# Patient Record
Sex: Female | Born: 1935
Health system: Southern US, Community
[De-identification: ages and names within clinical notes are randomized; demographics above are authoritative.]

## PROBLEM LIST (undated history)

## (undated) DIAGNOSIS — I824Z2 Acute embolism and thrombosis of unspecified deep veins of left distal lower extremity: Secondary | ICD-10-CM

## (undated) DIAGNOSIS — F419 Anxiety disorder, unspecified: Secondary | ICD-10-CM

## (undated) DIAGNOSIS — M1990 Unspecified osteoarthritis, unspecified site: Secondary | ICD-10-CM

## (undated) DIAGNOSIS — R040 Epistaxis: Secondary | ICD-10-CM

## (undated) DIAGNOSIS — K6289 Other specified diseases of anus and rectum: Secondary | ICD-10-CM

## (undated) DIAGNOSIS — G629 Polyneuropathy, unspecified: Secondary | ICD-10-CM

## (undated) HISTORY — DX: Other specified diseases of anus and rectum: K62.89

## (undated) HISTORY — PX: ABDOMINAL HYSTERECTOMY: SHX81

## (undated) HISTORY — DX: Acute embolism and thrombosis of unspecified deep veins of left distal lower extremity: I82.4Z2

## (undated) HISTORY — PX: LASIK: SHX215

## (undated) HISTORY — DX: Anxiety disorder, unspecified: F41.9

## (undated) HISTORY — PX: CATARACT EXTRACTION, BILATERAL: SHX1313

---

## 2000-04-10 HISTORY — PX: TOTAL HIP ARTHROPLASTY: SHX124

## 2005-02-07 DIAGNOSIS — I824Z2 Acute embolism and thrombosis of unspecified deep veins of left distal lower extremity: Secondary | ICD-10-CM

## 2005-02-07 HISTORY — DX: Acute embolism and thrombosis of unspecified deep veins of left distal lower extremity: I82.4Z2

## 2009-01-06 DIAGNOSIS — G629 Polyneuropathy, unspecified: Secondary | ICD-10-CM

## 2009-01-06 HISTORY — DX: Polyneuropathy, unspecified: G62.9

## 2011-04-21 DIAGNOSIS — Z1231 Encounter for screening mammogram for malignant neoplasm of breast: Secondary | ICD-10-CM | POA: Diagnosis not present

## 2011-04-21 DIAGNOSIS — R922 Inconclusive mammogram: Secondary | ICD-10-CM | POA: Diagnosis not present

## 2011-06-21 DIAGNOSIS — H2510 Age-related nuclear cataract, unspecified eye: Secondary | ICD-10-CM | POA: Diagnosis not present

## 2011-07-03 DIAGNOSIS — M25579 Pain in unspecified ankle and joints of unspecified foot: Secondary | ICD-10-CM | POA: Diagnosis not present

## 2011-07-03 DIAGNOSIS — I739 Peripheral vascular disease, unspecified: Secondary | ICD-10-CM | POA: Diagnosis not present

## 2011-07-03 DIAGNOSIS — M2040 Other hammer toe(s) (acquired), unspecified foot: Secondary | ICD-10-CM | POA: Diagnosis not present

## 2011-08-11 DIAGNOSIS — D485 Neoplasm of uncertain behavior of skin: Secondary | ICD-10-CM | POA: Diagnosis not present

## 2011-08-11 DIAGNOSIS — L259 Unspecified contact dermatitis, unspecified cause: Secondary | ICD-10-CM | POA: Diagnosis not present

## 2011-08-11 DIAGNOSIS — L570 Actinic keratosis: Secondary | ICD-10-CM | POA: Diagnosis not present

## 2011-08-14 DIAGNOSIS — N760 Acute vaginitis: Secondary | ICD-10-CM | POA: Diagnosis not present

## 2011-09-13 DIAGNOSIS — IMO0002 Reserved for concepts with insufficient information to code with codable children: Secondary | ICD-10-CM | POA: Diagnosis not present

## 2011-09-13 DIAGNOSIS — E782 Mixed hyperlipidemia: Secondary | ICD-10-CM | POA: Diagnosis not present

## 2011-09-19 DIAGNOSIS — I739 Peripheral vascular disease, unspecified: Secondary | ICD-10-CM | POA: Diagnosis not present

## 2011-09-22 DIAGNOSIS — E782 Mixed hyperlipidemia: Secondary | ICD-10-CM | POA: Diagnosis not present

## 2011-09-22 DIAGNOSIS — IMO0002 Reserved for concepts with insufficient information to code with codable children: Secondary | ICD-10-CM | POA: Diagnosis not present

## 2011-09-26 DIAGNOSIS — M25579 Pain in unspecified ankle and joints of unspecified foot: Secondary | ICD-10-CM | POA: Diagnosis not present

## 2011-09-26 DIAGNOSIS — M79609 Pain in unspecified limb: Secondary | ICD-10-CM | POA: Diagnosis not present

## 2011-10-24 DIAGNOSIS — M25579 Pain in unspecified ankle and joints of unspecified foot: Secondary | ICD-10-CM | POA: Diagnosis not present

## 2011-10-24 DIAGNOSIS — M79609 Pain in unspecified limb: Secondary | ICD-10-CM | POA: Diagnosis not present

## 2011-10-24 DIAGNOSIS — M722 Plantar fascial fibromatosis: Secondary | ICD-10-CM | POA: Diagnosis not present

## 2011-11-22 DIAGNOSIS — H2510 Age-related nuclear cataract, unspecified eye: Secondary | ICD-10-CM | POA: Diagnosis not present

## 2011-11-28 DIAGNOSIS — I739 Peripheral vascular disease, unspecified: Secondary | ICD-10-CM | POA: Diagnosis not present

## 2012-01-11 DIAGNOSIS — Z23 Encounter for immunization: Secondary | ICD-10-CM | POA: Diagnosis not present

## 2012-02-06 DIAGNOSIS — I739 Peripheral vascular disease, unspecified: Secondary | ICD-10-CM | POA: Diagnosis not present

## 2012-02-14 DIAGNOSIS — D485 Neoplasm of uncertain behavior of skin: Secondary | ICD-10-CM | POA: Diagnosis not present

## 2012-02-14 DIAGNOSIS — M79609 Pain in unspecified limb: Secondary | ICD-10-CM | POA: Diagnosis not present

## 2012-02-14 DIAGNOSIS — L851 Acquired keratosis [keratoderma] palmaris et plantaris: Secondary | ICD-10-CM | POA: Diagnosis not present

## 2012-02-14 DIAGNOSIS — M25579 Pain in unspecified ankle and joints of unspecified foot: Secondary | ICD-10-CM | POA: Diagnosis not present

## 2012-02-14 DIAGNOSIS — L820 Inflamed seborrheic keratosis: Secondary | ICD-10-CM | POA: Diagnosis not present

## 2012-02-14 DIAGNOSIS — Z85828 Personal history of other malignant neoplasm of skin: Secondary | ICD-10-CM | POA: Diagnosis not present

## 2012-02-14 DIAGNOSIS — L259 Unspecified contact dermatitis, unspecified cause: Secondary | ICD-10-CM | POA: Diagnosis not present

## 2012-02-14 DIAGNOSIS — L570 Actinic keratosis: Secondary | ICD-10-CM | POA: Diagnosis not present

## 2012-03-26 DIAGNOSIS — D485 Neoplasm of uncertain behavior of skin: Secondary | ICD-10-CM | POA: Diagnosis not present

## 2012-03-26 DIAGNOSIS — L259 Unspecified contact dermatitis, unspecified cause: Secondary | ICD-10-CM | POA: Diagnosis not present

## 2012-03-26 DIAGNOSIS — L918 Other hypertrophic disorders of the skin: Secondary | ICD-10-CM | POA: Diagnosis not present

## 2012-04-23 DIAGNOSIS — I739 Peripheral vascular disease, unspecified: Secondary | ICD-10-CM | POA: Diagnosis not present

## 2012-05-27 DIAGNOSIS — H2510 Age-related nuclear cataract, unspecified eye: Secondary | ICD-10-CM | POA: Diagnosis not present

## 2012-05-27 DIAGNOSIS — H25019 Cortical age-related cataract, unspecified eye: Secondary | ICD-10-CM | POA: Diagnosis not present

## 2012-06-12 DIAGNOSIS — L2089 Other atopic dermatitis: Secondary | ICD-10-CM | POA: Diagnosis not present

## 2012-06-12 DIAGNOSIS — K5900 Constipation, unspecified: Secondary | ICD-10-CM | POA: Diagnosis not present

## 2012-07-09 DIAGNOSIS — I739 Peripheral vascular disease, unspecified: Secondary | ICD-10-CM | POA: Diagnosis not present

## 2012-08-07 DIAGNOSIS — F329 Major depressive disorder, single episode, unspecified: Secondary | ICD-10-CM | POA: Diagnosis not present

## 2012-08-21 DIAGNOSIS — Z85828 Personal history of other malignant neoplasm of skin: Secondary | ICD-10-CM | POA: Diagnosis not present

## 2012-08-21 DIAGNOSIS — L570 Actinic keratosis: Secondary | ICD-10-CM | POA: Diagnosis not present

## 2012-08-21 DIAGNOSIS — D485 Neoplasm of uncertain behavior of skin: Secondary | ICD-10-CM | POA: Diagnosis not present

## 2012-08-26 DIAGNOSIS — H43399 Other vitreous opacities, unspecified eye: Secondary | ICD-10-CM | POA: Diagnosis not present

## 2012-08-26 DIAGNOSIS — H43819 Vitreous degeneration, unspecified eye: Secondary | ICD-10-CM | POA: Diagnosis not present

## 2012-08-27 DIAGNOSIS — F329 Major depressive disorder, single episode, unspecified: Secondary | ICD-10-CM | POA: Diagnosis not present

## 2012-09-17 DIAGNOSIS — I739 Peripheral vascular disease, unspecified: Secondary | ICD-10-CM | POA: Diagnosis not present

## 2012-11-05 DIAGNOSIS — M25579 Pain in unspecified ankle and joints of unspecified foot: Secondary | ICD-10-CM | POA: Diagnosis not present

## 2012-11-05 DIAGNOSIS — M79609 Pain in unspecified limb: Secondary | ICD-10-CM | POA: Diagnosis not present

## 2012-12-03 DIAGNOSIS — I739 Peripheral vascular disease, unspecified: Secondary | ICD-10-CM | POA: Diagnosis not present

## 2012-12-18 DIAGNOSIS — Z23 Encounter for immunization: Secondary | ICD-10-CM | POA: Diagnosis not present

## 2013-01-08 DIAGNOSIS — E782 Mixed hyperlipidemia: Secondary | ICD-10-CM | POA: Diagnosis not present

## 2013-01-08 DIAGNOSIS — K5900 Constipation, unspecified: Secondary | ICD-10-CM | POA: Diagnosis not present

## 2013-01-08 DIAGNOSIS — L2089 Other atopic dermatitis: Secondary | ICD-10-CM | POA: Diagnosis not present

## 2013-01-08 DIAGNOSIS — F329 Major depressive disorder, single episode, unspecified: Secondary | ICD-10-CM | POA: Diagnosis not present

## 2013-01-08 DIAGNOSIS — IMO0002 Reserved for concepts with insufficient information to code with codable children: Secondary | ICD-10-CM | POA: Diagnosis not present

## 2013-01-15 DIAGNOSIS — H4011X0 Primary open-angle glaucoma, stage unspecified: Secondary | ICD-10-CM | POA: Diagnosis not present

## 2013-01-15 DIAGNOSIS — H353190 Nonexudative age-related macular degeneration, unspecified eye, stage unspecified: Secondary | ICD-10-CM | POA: Diagnosis not present

## 2013-01-15 DIAGNOSIS — H2510 Age-related nuclear cataract, unspecified eye: Secondary | ICD-10-CM | POA: Diagnosis not present

## 2013-01-15 DIAGNOSIS — H25019 Cortical age-related cataract, unspecified eye: Secondary | ICD-10-CM | POA: Diagnosis not present

## 2013-01-16 DIAGNOSIS — K5900 Constipation, unspecified: Secondary | ICD-10-CM | POA: Diagnosis not present

## 2013-01-16 DIAGNOSIS — IMO0002 Reserved for concepts with insufficient information to code with codable children: Secondary | ICD-10-CM | POA: Diagnosis not present

## 2013-01-16 DIAGNOSIS — Z1331 Encounter for screening for depression: Secondary | ICD-10-CM | POA: Diagnosis not present

## 2013-01-16 DIAGNOSIS — E782 Mixed hyperlipidemia: Secondary | ICD-10-CM | POA: Diagnosis not present

## 2013-01-16 DIAGNOSIS — L2089 Other atopic dermatitis: Secondary | ICD-10-CM | POA: Diagnosis not present

## 2013-01-23 DIAGNOSIS — Z1231 Encounter for screening mammogram for malignant neoplasm of breast: Secondary | ICD-10-CM | POA: Diagnosis not present

## 2013-02-03 DIAGNOSIS — H2510 Age-related nuclear cataract, unspecified eye: Secondary | ICD-10-CM | POA: Diagnosis not present

## 2013-02-06 DIAGNOSIS — E2839 Other primary ovarian failure: Secondary | ICD-10-CM | POA: Diagnosis not present

## 2013-02-06 DIAGNOSIS — Z90710 Acquired absence of both cervix and uterus: Secondary | ICD-10-CM | POA: Diagnosis not present

## 2013-02-06 DIAGNOSIS — M899 Disorder of bone, unspecified: Secondary | ICD-10-CM | POA: Diagnosis not present

## 2013-02-06 DIAGNOSIS — Z96649 Presence of unspecified artificial hip joint: Secondary | ICD-10-CM | POA: Diagnosis not present

## 2013-02-11 DIAGNOSIS — I739 Peripheral vascular disease, unspecified: Secondary | ICD-10-CM | POA: Diagnosis not present

## 2013-03-04 DIAGNOSIS — H2510 Age-related nuclear cataract, unspecified eye: Secondary | ICD-10-CM | POA: Diagnosis not present

## 2013-04-30 DIAGNOSIS — I739 Peripheral vascular disease, unspecified: Secondary | ICD-10-CM | POA: Diagnosis not present

## 2013-07-09 DIAGNOSIS — I739 Peripheral vascular disease, unspecified: Secondary | ICD-10-CM | POA: Diagnosis not present

## 2013-07-24 DIAGNOSIS — M79609 Pain in unspecified limb: Secondary | ICD-10-CM | POA: Diagnosis not present

## 2013-07-24 DIAGNOSIS — M1990 Unspecified osteoarthritis, unspecified site: Secondary | ICD-10-CM | POA: Diagnosis not present

## 2013-07-24 DIAGNOSIS — J019 Acute sinusitis, unspecified: Secondary | ICD-10-CM | POA: Diagnosis not present

## 2013-07-24 DIAGNOSIS — M542 Cervicalgia: Secondary | ICD-10-CM | POA: Diagnosis not present

## 2013-08-25 DIAGNOSIS — L570 Actinic keratosis: Secondary | ICD-10-CM | POA: Diagnosis not present

## 2013-08-25 DIAGNOSIS — Z85828 Personal history of other malignant neoplasm of skin: Secondary | ICD-10-CM | POA: Diagnosis not present

## 2013-09-09 DIAGNOSIS — E782 Mixed hyperlipidemia: Secondary | ICD-10-CM | POA: Diagnosis not present

## 2013-09-09 DIAGNOSIS — IMO0002 Reserved for concepts with insufficient information to code with codable children: Secondary | ICD-10-CM | POA: Diagnosis not present

## 2013-09-09 DIAGNOSIS — M1990 Unspecified osteoarthritis, unspecified site: Secondary | ICD-10-CM | POA: Diagnosis not present

## 2013-09-16 DIAGNOSIS — IMO0002 Reserved for concepts with insufficient information to code with codable children: Secondary | ICD-10-CM | POA: Diagnosis not present

## 2013-09-16 DIAGNOSIS — L2089 Other atopic dermatitis: Secondary | ICD-10-CM | POA: Diagnosis not present

## 2013-09-16 DIAGNOSIS — K5900 Constipation, unspecified: Secondary | ICD-10-CM | POA: Diagnosis not present

## 2013-09-16 DIAGNOSIS — E782 Mixed hyperlipidemia: Secondary | ICD-10-CM | POA: Diagnosis not present

## 2013-09-16 DIAGNOSIS — M542 Cervicalgia: Secondary | ICD-10-CM | POA: Diagnosis not present

## 2013-09-17 DIAGNOSIS — H353190 Nonexudative age-related macular degeneration, unspecified eye, stage unspecified: Secondary | ICD-10-CM | POA: Diagnosis not present

## 2013-09-17 DIAGNOSIS — Z961 Presence of intraocular lens: Secondary | ICD-10-CM | POA: Diagnosis not present

## 2013-09-17 DIAGNOSIS — H43819 Vitreous degeneration, unspecified eye: Secondary | ICD-10-CM | POA: Diagnosis not present

## 2013-09-17 DIAGNOSIS — H4011X0 Primary open-angle glaucoma, stage unspecified: Secondary | ICD-10-CM | POA: Diagnosis not present

## 2013-09-24 DIAGNOSIS — I739 Peripheral vascular disease, unspecified: Secondary | ICD-10-CM | POA: Diagnosis not present

## 2013-10-02 DIAGNOSIS — M542 Cervicalgia: Secondary | ICD-10-CM | POA: Diagnosis not present

## 2013-10-29 DIAGNOSIS — IMO0002 Reserved for concepts with insufficient information to code with codable children: Secondary | ICD-10-CM | POA: Diagnosis not present

## 2013-10-29 DIAGNOSIS — M9981 Other biomechanical lesions of cervical region: Secondary | ICD-10-CM | POA: Diagnosis not present

## 2013-10-30 DIAGNOSIS — IMO0002 Reserved for concepts with insufficient information to code with codable children: Secondary | ICD-10-CM | POA: Diagnosis not present

## 2013-10-30 DIAGNOSIS — M9981 Other biomechanical lesions of cervical region: Secondary | ICD-10-CM | POA: Diagnosis not present

## 2013-11-03 DIAGNOSIS — IMO0002 Reserved for concepts with insufficient information to code with codable children: Secondary | ICD-10-CM | POA: Diagnosis not present

## 2013-11-03 DIAGNOSIS — M9981 Other biomechanical lesions of cervical region: Secondary | ICD-10-CM | POA: Diagnosis not present

## 2013-11-06 DIAGNOSIS — IMO0002 Reserved for concepts with insufficient information to code with codable children: Secondary | ICD-10-CM | POA: Diagnosis not present

## 2013-11-06 DIAGNOSIS — M9981 Other biomechanical lesions of cervical region: Secondary | ICD-10-CM | POA: Diagnosis not present

## 2013-11-10 DIAGNOSIS — M9981 Other biomechanical lesions of cervical region: Secondary | ICD-10-CM | POA: Diagnosis not present

## 2013-11-10 DIAGNOSIS — IMO0002 Reserved for concepts with insufficient information to code with codable children: Secondary | ICD-10-CM | POA: Diagnosis not present

## 2013-11-12 DIAGNOSIS — IMO0002 Reserved for concepts with insufficient information to code with codable children: Secondary | ICD-10-CM | POA: Diagnosis not present

## 2013-11-12 DIAGNOSIS — M9981 Other biomechanical lesions of cervical region: Secondary | ICD-10-CM | POA: Diagnosis not present

## 2013-11-13 DIAGNOSIS — IMO0002 Reserved for concepts with insufficient information to code with codable children: Secondary | ICD-10-CM | POA: Diagnosis not present

## 2013-11-13 DIAGNOSIS — M9981 Other biomechanical lesions of cervical region: Secondary | ICD-10-CM | POA: Diagnosis not present

## 2013-11-17 DIAGNOSIS — M9981 Other biomechanical lesions of cervical region: Secondary | ICD-10-CM | POA: Diagnosis not present

## 2013-11-17 DIAGNOSIS — IMO0002 Reserved for concepts with insufficient information to code with codable children: Secondary | ICD-10-CM | POA: Diagnosis not present

## 2013-11-19 DIAGNOSIS — IMO0002 Reserved for concepts with insufficient information to code with codable children: Secondary | ICD-10-CM | POA: Diagnosis not present

## 2013-11-19 DIAGNOSIS — M9981 Other biomechanical lesions of cervical region: Secondary | ICD-10-CM | POA: Diagnosis not present

## 2013-11-20 DIAGNOSIS — IMO0002 Reserved for concepts with insufficient information to code with codable children: Secondary | ICD-10-CM | POA: Diagnosis not present

## 2013-11-20 DIAGNOSIS — M9981 Other biomechanical lesions of cervical region: Secondary | ICD-10-CM | POA: Diagnosis not present

## 2013-11-24 DIAGNOSIS — M9981 Other biomechanical lesions of cervical region: Secondary | ICD-10-CM | POA: Diagnosis not present

## 2013-11-24 DIAGNOSIS — IMO0002 Reserved for concepts with insufficient information to code with codable children: Secondary | ICD-10-CM | POA: Diagnosis not present

## 2013-12-09 DIAGNOSIS — I739 Peripheral vascular disease, unspecified: Secondary | ICD-10-CM | POA: Diagnosis not present

## 2014-01-06 DIAGNOSIS — L299 Pruritus, unspecified: Secondary | ICD-10-CM | POA: Diagnosis not present

## 2014-01-08 DIAGNOSIS — L299 Pruritus, unspecified: Secondary | ICD-10-CM | POA: Diagnosis not present

## 2014-01-15 DIAGNOSIS — Z23 Encounter for immunization: Secondary | ICD-10-CM | POA: Diagnosis not present

## 2014-02-17 DIAGNOSIS — D485 Neoplasm of uncertain behavior of skin: Secondary | ICD-10-CM | POA: Diagnosis not present

## 2014-02-17 DIAGNOSIS — L570 Actinic keratosis: Secondary | ICD-10-CM | POA: Diagnosis not present

## 2014-02-17 DIAGNOSIS — T148 Other injury of unspecified body region: Secondary | ICD-10-CM | POA: Diagnosis not present

## 2014-02-17 DIAGNOSIS — L989 Disorder of the skin and subcutaneous tissue, unspecified: Secondary | ICD-10-CM | POA: Diagnosis not present

## 2014-02-17 DIAGNOSIS — Z85828 Personal history of other malignant neoplasm of skin: Secondary | ICD-10-CM | POA: Diagnosis not present

## 2014-02-24 DIAGNOSIS — I739 Peripheral vascular disease, unspecified: Secondary | ICD-10-CM | POA: Diagnosis not present

## 2014-03-10 DIAGNOSIS — B86 Scabies: Secondary | ICD-10-CM | POA: Diagnosis not present

## 2014-03-11 DIAGNOSIS — M1990 Unspecified osteoarthritis, unspecified site: Secondary | ICD-10-CM | POA: Diagnosis not present

## 2014-03-11 DIAGNOSIS — L299 Pruritus, unspecified: Secondary | ICD-10-CM | POA: Diagnosis not present

## 2014-03-11 DIAGNOSIS — F328 Other depressive episodes: Secondary | ICD-10-CM | POA: Diagnosis not present

## 2014-03-11 DIAGNOSIS — E782 Mixed hyperlipidemia: Secondary | ICD-10-CM | POA: Diagnosis not present

## 2014-03-17 DIAGNOSIS — F329 Major depressive disorder, single episode, unspecified: Secondary | ICD-10-CM | POA: Diagnosis not present

## 2014-03-17 DIAGNOSIS — M542 Cervicalgia: Secondary | ICD-10-CM | POA: Diagnosis not present

## 2014-03-17 DIAGNOSIS — L299 Pruritus, unspecified: Secondary | ICD-10-CM | POA: Diagnosis not present

## 2014-03-17 DIAGNOSIS — Z1389 Encounter for screening for other disorder: Secondary | ICD-10-CM | POA: Diagnosis not present

## 2014-03-17 DIAGNOSIS — E782 Mixed hyperlipidemia: Secondary | ICD-10-CM | POA: Diagnosis not present

## 2014-03-24 DIAGNOSIS — Z85828 Personal history of other malignant neoplasm of skin: Secondary | ICD-10-CM | POA: Diagnosis not present

## 2014-03-24 DIAGNOSIS — L570 Actinic keratosis: Secondary | ICD-10-CM | POA: Diagnosis not present

## 2014-03-24 DIAGNOSIS — L97909 Non-pressure chronic ulcer of unspecified part of unspecified lower leg with unspecified severity: Secondary | ICD-10-CM | POA: Diagnosis not present

## 2014-04-15 DIAGNOSIS — R5381 Other malaise: Secondary | ICD-10-CM | POA: Diagnosis not present

## 2014-04-15 DIAGNOSIS — L509 Urticaria, unspecified: Secondary | ICD-10-CM | POA: Diagnosis not present

## 2014-04-15 DIAGNOSIS — D485 Neoplasm of uncertain behavior of skin: Secondary | ICD-10-CM | POA: Diagnosis not present

## 2014-04-15 DIAGNOSIS — Z85828 Personal history of other malignant neoplasm of skin: Secondary | ICD-10-CM | POA: Diagnosis not present

## 2014-04-15 DIAGNOSIS — L309 Dermatitis, unspecified: Secondary | ICD-10-CM | POA: Diagnosis not present

## 2014-04-21 DIAGNOSIS — L509 Urticaria, unspecified: Secondary | ICD-10-CM | POA: Diagnosis not present

## 2014-05-05 DIAGNOSIS — L509 Urticaria, unspecified: Secondary | ICD-10-CM | POA: Diagnosis not present

## 2014-05-19 DIAGNOSIS — I739 Peripheral vascular disease, unspecified: Secondary | ICD-10-CM | POA: Diagnosis not present

## 2014-05-20 DIAGNOSIS — L509 Urticaria, unspecified: Secondary | ICD-10-CM | POA: Diagnosis not present

## 2014-05-26 DIAGNOSIS — M79606 Pain in leg, unspecified: Secondary | ICD-10-CM | POA: Diagnosis not present

## 2014-05-26 DIAGNOSIS — F419 Anxiety disorder, unspecified: Secondary | ICD-10-CM | POA: Diagnosis not present

## 2014-05-26 DIAGNOSIS — F329 Major depressive disorder, single episode, unspecified: Secondary | ICD-10-CM | POA: Diagnosis not present

## 2014-05-28 DIAGNOSIS — F419 Anxiety disorder, unspecified: Secondary | ICD-10-CM | POA: Diagnosis not present

## 2014-05-28 DIAGNOSIS — F329 Major depressive disorder, single episode, unspecified: Secondary | ICD-10-CM | POA: Diagnosis not present

## 2014-05-29 DIAGNOSIS — R404 Transient alteration of awareness: Secondary | ICD-10-CM | POA: Diagnosis not present

## 2014-05-29 DIAGNOSIS — E538 Deficiency of other specified B group vitamins: Secondary | ICD-10-CM | POA: Diagnosis not present

## 2014-05-29 DIAGNOSIS — F419 Anxiety disorder, unspecified: Secondary | ICD-10-CM | POA: Diagnosis not present

## 2014-05-29 DIAGNOSIS — Z880 Allergy status to penicillin: Secondary | ICD-10-CM | POA: Diagnosis not present

## 2014-05-29 DIAGNOSIS — F329 Major depressive disorder, single episode, unspecified: Secondary | ICD-10-CM | POA: Diagnosis not present

## 2014-05-29 DIAGNOSIS — D649 Anemia, unspecified: Secondary | ICD-10-CM | POA: Diagnosis not present

## 2014-05-29 DIAGNOSIS — Z881 Allergy status to other antibiotic agents status: Secondary | ICD-10-CM | POA: Diagnosis not present

## 2014-05-29 DIAGNOSIS — Z79899 Other long term (current) drug therapy: Secondary | ICD-10-CM | POA: Diagnosis not present

## 2014-05-29 DIAGNOSIS — R197 Diarrhea, unspecified: Secondary | ICD-10-CM | POA: Diagnosis not present

## 2014-05-29 DIAGNOSIS — K5732 Diverticulitis of large intestine without perforation or abscess without bleeding: Secondary | ICD-10-CM | POA: Diagnosis not present

## 2014-05-29 DIAGNOSIS — R531 Weakness: Secondary | ICD-10-CM | POA: Diagnosis not present

## 2014-05-30 DIAGNOSIS — K5732 Diverticulitis of large intestine without perforation or abscess without bleeding: Secondary | ICD-10-CM | POA: Diagnosis not present

## 2014-05-30 DIAGNOSIS — R197 Diarrhea, unspecified: Secondary | ICD-10-CM | POA: Diagnosis not present

## 2014-05-31 DIAGNOSIS — K5732 Diverticulitis of large intestine without perforation or abscess without bleeding: Secondary | ICD-10-CM | POA: Diagnosis not present

## 2014-06-01 DIAGNOSIS — D519 Vitamin B12 deficiency anemia, unspecified: Secondary | ICD-10-CM | POA: Diagnosis not present

## 2014-06-01 DIAGNOSIS — I5031 Acute diastolic (congestive) heart failure: Secondary | ICD-10-CM | POA: Diagnosis not present

## 2014-06-03 DIAGNOSIS — D519 Vitamin B12 deficiency anemia, unspecified: Secondary | ICD-10-CM | POA: Diagnosis not present

## 2014-06-03 DIAGNOSIS — F329 Major depressive disorder, single episode, unspecified: Secondary | ICD-10-CM | POA: Diagnosis not present

## 2014-06-03 DIAGNOSIS — K5790 Diverticulosis of intestine, part unspecified, without perforation or abscess without bleeding: Secondary | ICD-10-CM | POA: Diagnosis not present

## 2014-06-08 DIAGNOSIS — R609 Edema, unspecified: Secondary | ICD-10-CM | POA: Diagnosis not present

## 2014-06-08 DIAGNOSIS — R0602 Shortness of breath: Secondary | ICD-10-CM | POA: Diagnosis not present

## 2014-06-08 DIAGNOSIS — I351 Nonrheumatic aortic (valve) insufficiency: Secondary | ICD-10-CM | POA: Diagnosis not present

## 2014-06-09 DIAGNOSIS — M7989 Other specified soft tissue disorders: Secondary | ICD-10-CM | POA: Diagnosis not present

## 2014-06-09 DIAGNOSIS — I8001 Phlebitis and thrombophlebitis of superficial vessels of right lower extremity: Secondary | ICD-10-CM | POA: Diagnosis not present

## 2014-06-09 DIAGNOSIS — D519 Vitamin B12 deficiency anemia, unspecified: Secondary | ICD-10-CM | POA: Diagnosis not present

## 2014-06-09 DIAGNOSIS — R609 Edema, unspecified: Secondary | ICD-10-CM | POA: Diagnosis not present

## 2014-06-16 DIAGNOSIS — L0390 Cellulitis, unspecified: Secondary | ICD-10-CM | POA: Diagnosis not present

## 2014-06-16 DIAGNOSIS — D519 Vitamin B12 deficiency anemia, unspecified: Secondary | ICD-10-CM | POA: Diagnosis not present

## 2014-06-23 DIAGNOSIS — D519 Vitamin B12 deficiency anemia, unspecified: Secondary | ICD-10-CM | POA: Diagnosis not present

## 2014-06-30 DIAGNOSIS — F329 Major depressive disorder, single episode, unspecified: Secondary | ICD-10-CM | POA: Diagnosis not present

## 2014-06-30 DIAGNOSIS — D519 Vitamin B12 deficiency anemia, unspecified: Secondary | ICD-10-CM | POA: Diagnosis not present

## 2014-06-30 DIAGNOSIS — K5790 Diverticulosis of intestine, part unspecified, without perforation or abscess without bleeding: Secondary | ICD-10-CM | POA: Diagnosis not present

## 2014-07-24 DIAGNOSIS — D519 Vitamin B12 deficiency anemia, unspecified: Secondary | ICD-10-CM | POA: Diagnosis not present

## 2014-08-04 DIAGNOSIS — I739 Peripheral vascular disease, unspecified: Secondary | ICD-10-CM | POA: Diagnosis not present

## 2014-08-21 DIAGNOSIS — D519 Vitamin B12 deficiency anemia, unspecified: Secondary | ICD-10-CM | POA: Diagnosis not present

## 2014-09-01 DIAGNOSIS — D519 Vitamin B12 deficiency anemia, unspecified: Secondary | ICD-10-CM | POA: Diagnosis not present

## 2014-09-01 DIAGNOSIS — K5790 Diverticulosis of intestine, part unspecified, without perforation or abscess without bleeding: Secondary | ICD-10-CM | POA: Diagnosis not present

## 2014-09-01 DIAGNOSIS — E782 Mixed hyperlipidemia: Secondary | ICD-10-CM | POA: Diagnosis not present

## 2014-09-01 DIAGNOSIS — M1990 Unspecified osteoarthritis, unspecified site: Secondary | ICD-10-CM | POA: Diagnosis not present

## 2014-09-01 DIAGNOSIS — I5031 Acute diastolic (congestive) heart failure: Secondary | ICD-10-CM | POA: Diagnosis not present

## 2014-09-11 DIAGNOSIS — F329 Major depressive disorder, single episode, unspecified: Secondary | ICD-10-CM | POA: Diagnosis not present

## 2014-09-11 DIAGNOSIS — I5031 Acute diastolic (congestive) heart failure: Secondary | ICD-10-CM | POA: Diagnosis not present

## 2014-09-11 DIAGNOSIS — R21 Rash and other nonspecific skin eruption: Secondary | ICD-10-CM | POA: Diagnosis not present

## 2014-09-11 DIAGNOSIS — E782 Mixed hyperlipidemia: Secondary | ICD-10-CM | POA: Diagnosis not present

## 2014-09-11 DIAGNOSIS — D519 Vitamin B12 deficiency anemia, unspecified: Secondary | ICD-10-CM | POA: Diagnosis not present

## 2014-09-11 DIAGNOSIS — F419 Anxiety disorder, unspecified: Secondary | ICD-10-CM | POA: Diagnosis not present

## 2014-09-16 DIAGNOSIS — M79672 Pain in left foot: Secondary | ICD-10-CM | POA: Diagnosis not present

## 2014-09-16 DIAGNOSIS — M25579 Pain in unspecified ankle and joints of unspecified foot: Secondary | ICD-10-CM | POA: Diagnosis not present

## 2014-09-22 DIAGNOSIS — D519 Vitamin B12 deficiency anemia, unspecified: Secondary | ICD-10-CM | POA: Diagnosis not present

## 2014-09-30 DIAGNOSIS — Z961 Presence of intraocular lens: Secondary | ICD-10-CM | POA: Diagnosis not present

## 2014-09-30 DIAGNOSIS — H3531 Nonexudative age-related macular degeneration: Secondary | ICD-10-CM | POA: Diagnosis not present

## 2014-09-30 DIAGNOSIS — H43812 Vitreous degeneration, left eye: Secondary | ICD-10-CM | POA: Diagnosis not present

## 2014-09-30 DIAGNOSIS — H4011X1 Primary open-angle glaucoma, mild stage: Secondary | ICD-10-CM | POA: Diagnosis not present

## 2014-10-20 DIAGNOSIS — I739 Peripheral vascular disease, unspecified: Secondary | ICD-10-CM | POA: Diagnosis not present

## 2014-10-22 DIAGNOSIS — D519 Vitamin B12 deficiency anemia, unspecified: Secondary | ICD-10-CM | POA: Diagnosis not present

## 2014-11-23 DIAGNOSIS — D519 Vitamin B12 deficiency anemia, unspecified: Secondary | ICD-10-CM | POA: Diagnosis not present

## 2014-11-25 DIAGNOSIS — R55 Syncope and collapse: Secondary | ICD-10-CM | POA: Diagnosis not present

## 2014-12-17 DIAGNOSIS — Z1283 Encounter for screening for malignant neoplasm of skin: Secondary | ICD-10-CM | POA: Diagnosis not present

## 2014-12-17 DIAGNOSIS — L570 Actinic keratosis: Secondary | ICD-10-CM | POA: Diagnosis not present

## 2014-12-17 DIAGNOSIS — L820 Inflamed seborrheic keratosis: Secondary | ICD-10-CM | POA: Diagnosis not present

## 2014-12-17 DIAGNOSIS — L503 Dermatographic urticaria: Secondary | ICD-10-CM | POA: Diagnosis not present

## 2014-12-17 DIAGNOSIS — X32XXXA Exposure to sunlight, initial encounter: Secondary | ICD-10-CM | POA: Diagnosis not present

## 2014-12-24 DIAGNOSIS — D519 Vitamin B12 deficiency anemia, unspecified: Secondary | ICD-10-CM | POA: Diagnosis not present

## 2014-12-26 DIAGNOSIS — Z23 Encounter for immunization: Secondary | ICD-10-CM | POA: Diagnosis not present

## 2014-12-29 DIAGNOSIS — I739 Peripheral vascular disease, unspecified: Secondary | ICD-10-CM | POA: Diagnosis not present

## 2015-01-25 DIAGNOSIS — D519 Vitamin B12 deficiency anemia, unspecified: Secondary | ICD-10-CM | POA: Diagnosis not present

## 2015-01-27 DIAGNOSIS — H6063 Unspecified chronic otitis externa, bilateral: Secondary | ICD-10-CM | POA: Diagnosis not present

## 2015-01-27 DIAGNOSIS — H903 Sensorineural hearing loss, bilateral: Secondary | ICD-10-CM | POA: Diagnosis not present

## 2015-01-27 DIAGNOSIS — H9312 Tinnitus, left ear: Secondary | ICD-10-CM | POA: Diagnosis not present

## 2015-02-16 DIAGNOSIS — H9312 Tinnitus, left ear: Secondary | ICD-10-CM | POA: Diagnosis not present

## 2015-02-26 DIAGNOSIS — D519 Vitamin B12 deficiency anemia, unspecified: Secondary | ICD-10-CM | POA: Diagnosis not present

## 2015-03-10 ENCOUNTER — Other Ambulatory Visit: Payer: Self-pay | Admitting: Otolaryngology

## 2015-03-10 DIAGNOSIS — M1990 Unspecified osteoarthritis, unspecified site: Secondary | ICD-10-CM | POA: Diagnosis not present

## 2015-03-10 DIAGNOSIS — F419 Anxiety disorder, unspecified: Secondary | ICD-10-CM | POA: Diagnosis not present

## 2015-03-10 DIAGNOSIS — D519 Vitamin B12 deficiency anemia, unspecified: Secondary | ICD-10-CM | POA: Diagnosis not present

## 2015-03-10 DIAGNOSIS — H9190 Unspecified hearing loss, unspecified ear: Secondary | ICD-10-CM

## 2015-03-10 DIAGNOSIS — K5790 Diverticulosis of intestine, part unspecified, without perforation or abscess without bleeding: Secondary | ICD-10-CM | POA: Diagnosis not present

## 2015-03-10 DIAGNOSIS — E782 Mixed hyperlipidemia: Secondary | ICD-10-CM | POA: Diagnosis not present

## 2015-03-10 DIAGNOSIS — F329 Major depressive disorder, single episode, unspecified: Secondary | ICD-10-CM | POA: Diagnosis not present

## 2015-03-16 DIAGNOSIS — Z86718 Personal history of other venous thrombosis and embolism: Secondary | ICD-10-CM | POA: Diagnosis not present

## 2015-03-16 DIAGNOSIS — E782 Mixed hyperlipidemia: Secondary | ICD-10-CM | POA: Diagnosis not present

## 2015-03-16 DIAGNOSIS — F419 Anxiety disorder, unspecified: Secondary | ICD-10-CM | POA: Diagnosis not present

## 2015-03-16 DIAGNOSIS — D519 Vitamin B12 deficiency anemia, unspecified: Secondary | ICD-10-CM | POA: Diagnosis not present

## 2015-03-16 DIAGNOSIS — I5031 Acute diastolic (congestive) heart failure: Secondary | ICD-10-CM | POA: Diagnosis not present

## 2015-03-16 DIAGNOSIS — R21 Rash and other nonspecific skin eruption: Secondary | ICD-10-CM | POA: Diagnosis not present

## 2015-03-16 DIAGNOSIS — F329 Major depressive disorder, single episode, unspecified: Secondary | ICD-10-CM | POA: Diagnosis not present

## 2015-03-22 DIAGNOSIS — I739 Peripheral vascular disease, unspecified: Secondary | ICD-10-CM | POA: Diagnosis not present

## 2015-03-26 ENCOUNTER — Ambulatory Visit
Admission: RE | Admit: 2015-03-26 | Discharge: 2015-03-26 | Disposition: A | Discharge status: Home / Self Care | Payer: Medicare Other | Source: Ambulatory Visit | Attending: Otolaryngology | Admitting: Otolaryngology

## 2015-03-26 DIAGNOSIS — H9312 Tinnitus, left ear: Secondary | ICD-10-CM | POA: Diagnosis not present

## 2015-03-26 DIAGNOSIS — H9190 Unspecified hearing loss, unspecified ear: Secondary | ICD-10-CM

## 2015-03-26 DIAGNOSIS — I639 Cerebral infarction, unspecified: Secondary | ICD-10-CM | POA: Diagnosis not present

## 2015-03-26 MED ORDER — GADOBENATE DIMEGLUMINE 529 MG/ML IV SOLN
14.0000 mL | Freq: Once | INTRAVENOUS | Status: AC | PRN
  Administered 2015-03-26: 14 mL via INTRAVENOUS

## 2015-03-29 DIAGNOSIS — D519 Vitamin B12 deficiency anemia, unspecified: Secondary | ICD-10-CM | POA: Diagnosis not present

## 2015-03-30 DIAGNOSIS — M25579 Pain in unspecified ankle and joints of unspecified foot: Secondary | ICD-10-CM | POA: Diagnosis not present

## 2015-03-30 DIAGNOSIS — M79672 Pain in left foot: Secondary | ICD-10-CM | POA: Diagnosis not present

## 2015-04-21 DIAGNOSIS — H353132 Nonexudative age-related macular degeneration, bilateral, intermediate dry stage: Secondary | ICD-10-CM | POA: Diagnosis not present

## 2015-04-21 DIAGNOSIS — H401131 Primary open-angle glaucoma, bilateral, mild stage: Secondary | ICD-10-CM | POA: Diagnosis not present

## 2015-04-29 DIAGNOSIS — D519 Vitamin B12 deficiency anemia, unspecified: Secondary | ICD-10-CM | POA: Diagnosis not present

## 2015-05-24 DIAGNOSIS — J209 Acute bronchitis, unspecified: Secondary | ICD-10-CM | POA: Diagnosis not present

## 2015-05-24 DIAGNOSIS — J019 Acute sinusitis, unspecified: Secondary | ICD-10-CM | POA: Diagnosis not present

## 2015-05-31 DIAGNOSIS — D519 Vitamin B12 deficiency anemia, unspecified: Secondary | ICD-10-CM | POA: Diagnosis not present

## 2015-06-14 DIAGNOSIS — I739 Peripheral vascular disease, unspecified: Secondary | ICD-10-CM | POA: Diagnosis not present

## 2015-06-21 DIAGNOSIS — M25579 Pain in unspecified ankle and joints of unspecified foot: Secondary | ICD-10-CM | POA: Diagnosis not present

## 2015-06-21 DIAGNOSIS — M79672 Pain in left foot: Secondary | ICD-10-CM | POA: Diagnosis not present

## 2015-06-22 DIAGNOSIS — S50312A Abrasion of left elbow, initial encounter: Secondary | ICD-10-CM | POA: Diagnosis not present

## 2015-06-22 DIAGNOSIS — R51 Headache: Secondary | ICD-10-CM | POA: Diagnosis not present

## 2015-06-22 DIAGNOSIS — S199XXA Unspecified injury of neck, initial encounter: Secondary | ICD-10-CM | POA: Diagnosis not present

## 2015-06-22 DIAGNOSIS — G4489 Other headache syndrome: Secondary | ICD-10-CM | POA: Diagnosis not present

## 2015-06-22 DIAGNOSIS — Z96642 Presence of left artificial hip joint: Secondary | ICD-10-CM | POA: Diagnosis not present

## 2015-06-22 DIAGNOSIS — S60221A Contusion of right hand, initial encounter: Secondary | ICD-10-CM | POA: Diagnosis not present

## 2015-06-22 DIAGNOSIS — S59912A Unspecified injury of left forearm, initial encounter: Secondary | ICD-10-CM | POA: Diagnosis not present

## 2015-06-22 DIAGNOSIS — T887XXA Unspecified adverse effect of drug or medicament, initial encounter: Secondary | ICD-10-CM | POA: Diagnosis not present

## 2015-06-22 DIAGNOSIS — Z79899 Other long term (current) drug therapy: Secondary | ICD-10-CM | POA: Diagnosis not present

## 2015-06-22 DIAGNOSIS — S0081XA Abrasion of other part of head, initial encounter: Secondary | ICD-10-CM | POA: Diagnosis not present

## 2015-06-22 DIAGNOSIS — S40022A Contusion of left upper arm, initial encounter: Secondary | ICD-10-CM | POA: Diagnosis not present

## 2015-06-22 DIAGNOSIS — S0990XA Unspecified injury of head, initial encounter: Secondary | ICD-10-CM | POA: Diagnosis not present

## 2015-06-22 DIAGNOSIS — R220 Localized swelling, mass and lump, head: Secondary | ICD-10-CM | POA: Diagnosis not present

## 2015-06-22 DIAGNOSIS — S098XXA Other specified injuries of head, initial encounter: Secondary | ICD-10-CM | POA: Diagnosis not present

## 2015-06-22 DIAGNOSIS — S80211A Abrasion, right knee, initial encounter: Secondary | ICD-10-CM | POA: Diagnosis not present

## 2015-06-22 DIAGNOSIS — S40021A Contusion of right upper arm, initial encounter: Secondary | ICD-10-CM | POA: Diagnosis not present

## 2015-06-22 DIAGNOSIS — Z7901 Long term (current) use of anticoagulants: Secondary | ICD-10-CM | POA: Diagnosis not present

## 2015-06-22 DIAGNOSIS — S80212A Abrasion, left knee, initial encounter: Secondary | ICD-10-CM | POA: Diagnosis not present

## 2015-06-22 DIAGNOSIS — T45515A Adverse effect of anticoagulants, initial encounter: Secondary | ICD-10-CM | POA: Diagnosis not present

## 2015-06-22 DIAGNOSIS — H5712 Ocular pain, left eye: Secondary | ICD-10-CM | POA: Diagnosis not present

## 2015-06-23 DIAGNOSIS — Z23 Encounter for immunization: Secondary | ICD-10-CM | POA: Diagnosis not present

## 2015-06-23 DIAGNOSIS — S51002A Unspecified open wound of left elbow, initial encounter: Secondary | ICD-10-CM | POA: Diagnosis not present

## 2015-06-23 DIAGNOSIS — S0990XA Unspecified injury of head, initial encounter: Secondary | ICD-10-CM | POA: Diagnosis not present

## 2015-06-28 DIAGNOSIS — D519 Vitamin B12 deficiency anemia, unspecified: Secondary | ICD-10-CM | POA: Diagnosis not present

## 2015-07-05 DIAGNOSIS — M25552 Pain in left hip: Secondary | ICD-10-CM | POA: Diagnosis not present

## 2015-07-19 DIAGNOSIS — S0083XD Contusion of other part of head, subsequent encounter: Secondary | ICD-10-CM | POA: Diagnosis not present

## 2015-07-19 DIAGNOSIS — M25552 Pain in left hip: Secondary | ICD-10-CM | POA: Diagnosis not present

## 2015-07-30 DIAGNOSIS — D519 Vitamin B12 deficiency anemia, unspecified: Secondary | ICD-10-CM | POA: Diagnosis not present

## 2015-08-01 DIAGNOSIS — K5730 Diverticulosis of large intestine without perforation or abscess without bleeding: Secondary | ICD-10-CM | POA: Diagnosis not present

## 2015-08-01 DIAGNOSIS — Z880 Allergy status to penicillin: Secondary | ICD-10-CM | POA: Diagnosis not present

## 2015-08-01 DIAGNOSIS — I82402 Acute embolism and thrombosis of unspecified deep veins of left lower extremity: Secondary | ICD-10-CM | POA: Diagnosis not present

## 2015-08-01 DIAGNOSIS — Z96642 Presence of left artificial hip joint: Secondary | ICD-10-CM | POA: Diagnosis present

## 2015-08-01 DIAGNOSIS — I82412 Acute embolism and thrombosis of left femoral vein: Secondary | ICD-10-CM | POA: Diagnosis not present

## 2015-08-01 DIAGNOSIS — I82432 Acute embolism and thrombosis of left popliteal vein: Secondary | ICD-10-CM | POA: Diagnosis not present

## 2015-08-01 DIAGNOSIS — I82442 Acute embolism and thrombosis of left tibial vein: Secondary | ICD-10-CM | POA: Diagnosis not present

## 2015-08-01 DIAGNOSIS — Z7901 Long term (current) use of anticoagulants: Secondary | ICD-10-CM | POA: Diagnosis not present

## 2015-08-01 DIAGNOSIS — D6851 Activated protein C resistance: Secondary | ICD-10-CM | POA: Diagnosis not present

## 2015-08-01 DIAGNOSIS — F411 Generalized anxiety disorder: Secondary | ICD-10-CM | POA: Diagnosis not present

## 2015-08-01 DIAGNOSIS — I82422 Acute embolism and thrombosis of left iliac vein: Secondary | ICD-10-CM | POA: Diagnosis present

## 2015-08-01 DIAGNOSIS — E785 Hyperlipidemia, unspecified: Secondary | ICD-10-CM | POA: Diagnosis present

## 2015-08-01 DIAGNOSIS — F329 Major depressive disorder, single episode, unspecified: Secondary | ICD-10-CM | POA: Diagnosis present

## 2015-08-01 DIAGNOSIS — E7800 Pure hypercholesterolemia, unspecified: Secondary | ICD-10-CM | POA: Diagnosis present

## 2015-08-01 DIAGNOSIS — F410 Panic disorder [episodic paroxysmal anxiety] without agoraphobia: Secondary | ICD-10-CM | POA: Diagnosis present

## 2015-08-01 DIAGNOSIS — Z881 Allergy status to other antibiotic agents status: Secondary | ICD-10-CM | POA: Diagnosis not present

## 2015-08-01 DIAGNOSIS — Z79899 Other long term (current) drug therapy: Secondary | ICD-10-CM | POA: Diagnosis not present

## 2015-08-01 DIAGNOSIS — M7989 Other specified soft tissue disorders: Secondary | ICD-10-CM | POA: Diagnosis not present

## 2015-08-01 DIAGNOSIS — Z832 Family history of diseases of the blood and blood-forming organs and certain disorders involving the immune mechanism: Secondary | ICD-10-CM | POA: Diagnosis not present

## 2015-08-09 DIAGNOSIS — L509 Urticaria, unspecified: Secondary | ICD-10-CM | POA: Diagnosis not present

## 2015-08-23 DIAGNOSIS — I739 Peripheral vascular disease, unspecified: Secondary | ICD-10-CM | POA: Diagnosis not present

## 2015-08-31 ENCOUNTER — Ambulatory Visit: Payer: Self-pay | Admitting: Allergy and Immunology

## 2015-09-02 DIAGNOSIS — D519 Vitamin B12 deficiency anemia, unspecified: Secondary | ICD-10-CM | POA: Diagnosis not present

## 2015-09-02 DIAGNOSIS — I82402 Acute embolism and thrombosis of unspecified deep veins of left lower extremity: Secondary | ICD-10-CM | POA: Diagnosis not present

## 2015-09-02 DIAGNOSIS — R21 Rash and other nonspecific skin eruption: Secondary | ICD-10-CM | POA: Diagnosis not present

## 2015-09-02 DIAGNOSIS — L299 Pruritus, unspecified: Secondary | ICD-10-CM | POA: Diagnosis not present

## 2015-09-21 ENCOUNTER — Ambulatory Visit: Payer: Self-pay | Admitting: Allergy and Immunology

## 2015-10-04 DIAGNOSIS — D519 Vitamin B12 deficiency anemia, unspecified: Secondary | ICD-10-CM | POA: Diagnosis not present

## 2015-10-19 DIAGNOSIS — Z961 Presence of intraocular lens: Secondary | ICD-10-CM | POA: Diagnosis not present

## 2015-10-19 DIAGNOSIS — H43812 Vitreous degeneration, left eye: Secondary | ICD-10-CM | POA: Diagnosis not present

## 2015-10-19 DIAGNOSIS — H524 Presbyopia: Secondary | ICD-10-CM | POA: Diagnosis not present

## 2015-10-19 DIAGNOSIS — H52223 Regular astigmatism, bilateral: Secondary | ICD-10-CM | POA: Diagnosis not present

## 2015-10-19 DIAGNOSIS — H401131 Primary open-angle glaucoma, bilateral, mild stage: Secondary | ICD-10-CM | POA: Diagnosis not present

## 2015-10-19 DIAGNOSIS — H5212 Myopia, left eye: Secondary | ICD-10-CM | POA: Diagnosis not present

## 2015-10-26 ENCOUNTER — Ambulatory Visit (INDEPENDENT_AMBULATORY_CARE_PROVIDER_SITE_OTHER): Payer: Medicare Other | Admitting: Allergy and Immunology

## 2015-10-26 ENCOUNTER — Encounter: Payer: Self-pay | Admitting: Allergy and Immunology

## 2015-10-26 VITALS — BP 115/70 | HR 81 | Temp 97.6°F | Resp 17 | Ht 62.21 in | Wt 150.8 lb

## 2015-10-26 DIAGNOSIS — L299 Pruritus, unspecified: Secondary | ICD-10-CM

## 2015-10-26 NOTE — Patient Instructions (Signed)
Take Home Sheet  1. Avoidance: Mite and Mold  And of fragranced products.   2. Antihistamine:   Zyrtec 1-2 teaspoons by mouth once daily for runny nose or itching as needed.  3.  Moisturize skin 2-3 times daily ---Consider Vanicream or Cerave.   4.  May use Over the counter cortisone cream once daily as needed to rash areas.  5.  If new rash areas or skin changes take picture---environment, ingestion, activity and exposure.   6. Follow up Visit:  2 months or sooner if needed.   Websites that have reliable Patient information: 1. American Academy of Asthma, Allergy, & Immunology: www.aaaai.org 2. Food Allergy Network: www.foodallergy.org 3. Mothers of Asthmatics: www.aanma.org 4. [place]: DiningCalendar.de 5. American College of Allergy, Asthma, & Immunology: https://robertson.info/ or www.acaai.org  Control of House Dust Mite Allergen  House dust mites play a major role in allergic asthma and rhinitis.  They occur in environments with high humidity wherever human skin, the food for dust mites is found. High levels have been detected in dust obtained from mattresses, pillows, carpets, upholstered furniture, bed covers, clothes and soft toys.  The principal allergen of the house dust mite is found in its feces.  A gram of dust may contain 1,000 mites and 250,000 fecal particles.  Mite antigen is easily measured in the air during house cleaning activities.  1. Encase mattresses, including the box spring, and pillow, in an air tight cover.  Seal the zipper end of the encased mattresses with wide adhesive tape. 2. Wash the bedding in water of 130 degrees Farenheit weekly.  Avoid cotton comforters/quilts and flannel bedding: the most ideal bed covering is the dacron comforter. 3. Remove all upholstered furniture from the bedroom. 4. Remove carpets, carpet padding, rugs, and non-washable window drapes from the bedroom.  Wash drapes weekly or use plastic window  coverings. 5. Remove all non-washable stuffed toys from the bedroom.  Wash stuffed toys weekly. 6. Have the room cleaned frequently with a vacuum cleaner and a damp dust-mop.  The patient should not be in a room which is being cleaned and should wait 1 hour after cleaning before going into the room. 7. Close and seal all heating outlets in the bedroom.  Otherwise, the room will become filled with dust-laden air.  An electric heater can be used to heat the room. Reduce indoor humidity to less than 50%.  Do not use a humidifier.  Reducing Pollen Exposure  The American Academy of Allergy, Asthma and Immunology suggests the following steps to reduce your exposure to pollen during allergy seasons.  8. Do not hang sheets or clothing out to dry; pollen may collect on these items. 9. Do not mow lawns or spend time around freshly cut grass; mowing stirs up pollen. 10. Keep windows closed at night.  Keep car windows closed while driving. 11. Minimize morning activities outdoors, a time when pollen counts are usually at their highest. 12. Stay indoors as much as possible when pollen counts or humidity is high and on windy days when pollen tends to remain in the air longer. 13. Use air conditioning when possible.  Many air conditioners have filters that trap the pollen spores. 14. Use a HEPA room air filter to remove pollen form the indoor air you breathe.  Control of Mold Allergen  Mold and fungi can grow on a variety of surfaces provided certain temperature and moisture conditions exist.  Outdoor molds grow on plants, decaying vegetation and soil.  The major outdoor mold, Alternaria dn Cladosporium, are found in very high numbers during hot and dry conditions.  Generally, a late Summer - Fall peak is seen for common outdoor fungal spores.  Rain will temporarily lower outdoor mold spore count, but counts rise rapidly when the rainy period ends.  The most important indoor molds are Aspergillus and  Penicillium.  Dark, humid and poorly ventilated basements are ideal sites for mold growth.  The next most common sites of mold growth are the bathroom and the kitchen.  Outdoor [employer] 1. Use air conditioning and keep windows closed 2. Avoid exposure to decaying vegetation. 3. Avoid leaf raking. 4. Avoid grain handling. 5. Consider wearing a face mask if working in moldy areas.  Indoor Mold Control 1. Maintain humidity below 50%. 2. Clean washable surfaces with 5% bleach solution. 3. Remove sources e.g. Contaminated carpets.  Control of Cockroach Allergen  Cockroach allergen has been identified as an important cause of acute attacks of asthma, especially in urban settings.  There are fifty-five species of cockroach that exist in the [place], however only three, the [place], [place] species produce allergen that can affect patients with Asthma.  Allergens can be obtained from fecal particles, egg casings and secretions from cockroaches.  1. Remove food sources. 2. Reduce access to water. 3. Seal access and entry points. 4. Spray runways with 0.5-1% Diazinon or Chlorpyrifos 5. Blow boric acid power under stoves and refrigerator. 6. Place bait stations (hydramethylnon) at feeding sites.

## 2015-10-26 NOTE — Progress Notes (Signed)
NEW PATIENT NOTE  RE: Alexandria Weaver MRN: [id number] DOB: 1924-12-22 [facility] [address] [place], [place] [address] Date of Office Visit: 10/26/2015  Dear Tawni Carnes, PA-C:  I had the pleasure of seeing Alexandria Weaver today in initial evaluation, as you recall-- Subjective:  Alexandria Weaver is a 82 y.o. female who presents today for Pruritus("Usually at pressure points"  (joints))  Assessment:   1. Itching, improved with noted xerosis.   2.      Clear skin today, family preference to defer aeroallergen/food testing. 3.      Question of previous rash now improved. Plan:  1. Avoidance: Mite and Mold  And of fragranced products. 2. Antihistamine:   Zyrtec 1-2 teaspoons by mouth once daily for runny nose or itching as needed. 3.  Moisturize skin 2-3 times daily ---Consider Vanicream or Cerave. 4.  May use Over the counter cortisone cream once daily as needed to rash areas only 5.  If new rash areas or skin changes take picture---document environment, ingestion, activity and exposure. 6. Follow up Visit:  2 months or sooner if needed.  HPI: Alexandria Weaver presents to the office with her daughter in initial evaluation of itching.  They describe a 2 year history of skin concerns with evaluation by primary MD and 2 different dermatologists.  Her greatest areas of sensitivity have included posterior neck area across back, prompting tag removal, at trunk and intermittently "pressure points--joints".  There does not appear to be any acute reactions, swelling, current hives/rash or specific exposure, ingestion or activity as a provoking factor.  She denies any recurring allergic symptoms and has been using Aveeno in the last year which seems beneficial.  She did give a trial of discontinuing Tide and using Dreft as well as Aveeno in the shower.  Currently she often uses [employer] products and intermittently over-the-counter cortisone cream to pruritic areas.  It appears  she has been treated with Elimite, questionable benefit as well as discontinuation of her regular oral medications by Dr. Tarri Glenn.  He also modified her topical regime and at some time used triamcinolone cream and dapsone but family cannot remember if this was beneficial or when this occurred, including time with [facility], [employer].  Recently, she is better, minimal itching but continues to avoid rough clothing.  There is a question of any previous hive difficulty but none in 6 months and they understand that laboratory testing was normal after left leg DVT.  No chronic skin difficulties (no history of eczema) in previous years and denies rhinorrhea, congestion, sneezing, itchy watery eyes, cough and any food concerns.  Denies ED or Urgent care visits, prednisone or antibiotic courses treating any skin concerns.  Medical History: Past Medical History:  Diagnosis Date  . Acute deep vein thrombosis (DVT) of distal end of left lower extremity ([place])   . Anxiety    Surgical History: Past Surgical History:  Procedure Laterality Date  . LASIK    . TOTAL HIP ARTHROPLASTY  2002   Family History: Family History  Problem Relation Age of Onset  . Asthma Son   . Allergic rhinitis Son   . Factor V Leiden deficiency Daughter   . Lymphoma Daughter   . Angioedema Neg Hx   . Eczema Neg Hx   . Urticaria Neg Hx   . Immunodeficiency Neg Hx    Social History: Social History  . Marital status: Married    Spouse name: N/A  . Number of  children: 2  . Years of education: N/A   Social History Main Topics  . Smoking status: Never Smoker  . Smokeless tobacco: Never Used  . Alcohol use Not on file  . Drug use: Unknown  . Sexual activity: Not on file   Social History Narrative  . 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    Alexandria Weaver has a current medication list which includes the following prescription(s): acetaminophen, vitamin d, citalopram, megared omega-3 krill oil,  multivitamin-lutein, xarelto, latanoprost, and xarelto.   Drug Allergies: Allergies  Allergen Reactions  . Doxycycline Nausea Only  . Penicillins Rash   Environmental History: [name] lives in a 80 year old house for years with laminate floors, with central heat and air; stuffed mattress, non-feather pillow/comforter, indoor cat without humidifier or smokers.   Review of Systems  Constitutional: Negative for fever, malaise/fatigue and weight loss.  HENT: Positive for congestion. Negative for ear pain, hearing loss, nosebleeds and sore throat.   Eyes: Negative for discharge and redness.  Respiratory: Negative for shortness of breath.        Denies history of bronchitis and pneumonia.  Gastrointestinal: Negative for abdominal pain, constipation, diarrhea, heartburn, nausea and vomiting.  Genitourinary: Negative.   Musculoskeletal: Negative for joint pain and myalgias.  Skin: Negative.  Negative for itching and rash.  Neurological: Negative.  Negative for dizziness, seizures, weakness and headaches.  Endo/Heme/Allergies: Positive for environmental allergies.       Denies sensitivity to aspirin, NSAIDs, stinging insects, foods, latex, jewelry and cosmetics.  Immunological: No chronic or recurring infections. Objective:   Vitals:   10/26/15 0952  BP: 115/70  Pulse: 81  Resp: 17  Temp: 97.6 F (36.4 C)   SpO2 Readings from Last 1 Encounters:  10/26/15 95%   Physical Exam  Constitutional: She is well-developed, well-nourished, and in no distress.  HENT:  Head: Atraumatic.  Right Ear: Tympanic membrane and ear canal normal.  Left Ear: Tympanic membrane and ear canal normal.  Nose: Mucosal edema present. No rhinorrhea. No epistaxis.  Mouth/Throat: Oropharynx is clear and moist and mucous membranes are normal. No oropharyngeal exudate, posterior oropharyngeal edema or posterior oropharyngeal erythema.  Eyes: Conjunctivae are normal.  Neck: Neck supple.  Cardiovascular: Normal  rate, S1 normal and S2 normal.   No murmur heard. Pulmonary/Chest: Effort normal. She has no wheezes. She has no rhonchi. She has no rales.  Abdominal: Soft. Normal appearance and bowel sounds are normal.  Musculoskeletal: She exhibits no edema.  Lymphadenopathy:    She has no cervical adenopathy.  Neurological: She is alert.  Skin: Skin is warm, dry and intact. No rash noted. No cyanosis. Nails show no clubbing.  No dermatographia.   Diagnostics:   Skin testing:  Deferred today.    Zane Samson M. Ishmael Holter, MD   cc: Gar Ponto, MD

## 2015-10-31 ENCOUNTER — Encounter: Payer: Self-pay | Admitting: Allergy and Immunology

## 2015-11-01 DIAGNOSIS — I739 Peripheral vascular disease, unspecified: Secondary | ICD-10-CM | POA: Diagnosis not present

## 2015-11-03 DIAGNOSIS — D519 Vitamin B12 deficiency anemia, unspecified: Secondary | ICD-10-CM | POA: Diagnosis not present

## 2015-11-08 DIAGNOSIS — M545 Low back pain: Secondary | ICD-10-CM | POA: Diagnosis not present

## 2015-11-08 DIAGNOSIS — R109 Unspecified abdominal pain: Secondary | ICD-10-CM | POA: Diagnosis not present

## 2015-11-08 DIAGNOSIS — R030 Elevated blood-pressure reading, without diagnosis of hypertension: Secondary | ICD-10-CM | POA: Diagnosis not present

## 2015-11-08 DIAGNOSIS — E7800 Pure hypercholesterolemia, unspecified: Secondary | ICD-10-CM | POA: Diagnosis not present

## 2015-11-08 DIAGNOSIS — Z79899 Other long term (current) drug therapy: Secondary | ICD-10-CM | POA: Diagnosis not present

## 2015-11-08 DIAGNOSIS — R52 Pain, unspecified: Secondary | ICD-10-CM | POA: Diagnosis not present

## 2015-11-08 DIAGNOSIS — Z7901 Long term (current) use of anticoagulants: Secondary | ICD-10-CM | POA: Diagnosis not present

## 2015-11-08 DIAGNOSIS — R0789 Other chest pain: Secondary | ICD-10-CM | POA: Diagnosis not present

## 2015-11-08 DIAGNOSIS — F419 Anxiety disorder, unspecified: Secondary | ICD-10-CM | POA: Diagnosis not present

## 2015-11-08 DIAGNOSIS — F329 Major depressive disorder, single episode, unspecified: Secondary | ICD-10-CM | POA: Diagnosis not present

## 2015-11-08 DIAGNOSIS — R1031 Right lower quadrant pain: Secondary | ICD-10-CM | POA: Diagnosis not present

## 2015-11-08 DIAGNOSIS — F410 Panic disorder [episodic paroxysmal anxiety] without agoraphobia: Secondary | ICD-10-CM | POA: Diagnosis not present

## 2015-11-22 DIAGNOSIS — F419 Anxiety disorder, unspecified: Secondary | ICD-10-CM | POA: Diagnosis not present

## 2015-11-22 DIAGNOSIS — R21 Rash and other nonspecific skin eruption: Secondary | ICD-10-CM | POA: Diagnosis not present

## 2015-11-22 DIAGNOSIS — I5031 Acute diastolic (congestive) heart failure: Secondary | ICD-10-CM | POA: Diagnosis not present

## 2015-11-22 DIAGNOSIS — E782 Mixed hyperlipidemia: Secondary | ICD-10-CM | POA: Diagnosis not present

## 2015-11-22 DIAGNOSIS — Z86718 Personal history of other venous thrombosis and embolism: Secondary | ICD-10-CM | POA: Diagnosis not present

## 2015-11-22 DIAGNOSIS — D519 Vitamin B12 deficiency anemia, unspecified: Secondary | ICD-10-CM | POA: Diagnosis not present

## 2015-11-22 DIAGNOSIS — F329 Major depressive disorder, single episode, unspecified: Secondary | ICD-10-CM | POA: Diagnosis not present

## 2015-12-03 DIAGNOSIS — D519 Vitamin B12 deficiency anemia, unspecified: Secondary | ICD-10-CM | POA: Diagnosis not present

## 2015-12-10 ENCOUNTER — Other Ambulatory Visit: Payer: Self-pay

## 2015-12-20 DIAGNOSIS — Z23 Encounter for immunization: Secondary | ICD-10-CM | POA: Diagnosis not present

## 2015-12-28 ENCOUNTER — Ambulatory Visit: Payer: PRIVATE HEALTH INSURANCE | Admitting: Allergy & Immunology

## 2016-01-04 DIAGNOSIS — D519 Vitamin B12 deficiency anemia, unspecified: Secondary | ICD-10-CM | POA: Diagnosis not present

## 2016-01-06 DIAGNOSIS — L570 Actinic keratosis: Secondary | ICD-10-CM | POA: Diagnosis not present

## 2016-01-06 DIAGNOSIS — D225 Melanocytic nevi of trunk: Secondary | ICD-10-CM | POA: Diagnosis not present

## 2016-01-06 DIAGNOSIS — L820 Inflamed seborrheic keratosis: Secondary | ICD-10-CM | POA: Diagnosis not present

## 2016-01-06 DIAGNOSIS — X32XXXD Exposure to sunlight, subsequent encounter: Secondary | ICD-10-CM | POA: Diagnosis not present

## 2016-01-10 DIAGNOSIS — M25579 Pain in unspecified ankle and joints of unspecified foot: Secondary | ICD-10-CM | POA: Diagnosis not present

## 2016-01-10 DIAGNOSIS — M79672 Pain in left foot: Secondary | ICD-10-CM | POA: Diagnosis not present

## 2016-01-17 DIAGNOSIS — I739 Peripheral vascular disease, unspecified: Secondary | ICD-10-CM | POA: Diagnosis not present

## 2016-02-01 DIAGNOSIS — R29898 Other symptoms and signs involving the musculoskeletal system: Secondary | ICD-10-CM | POA: Diagnosis not present

## 2016-02-03 DIAGNOSIS — D519 Vitamin B12 deficiency anemia, unspecified: Secondary | ICD-10-CM | POA: Diagnosis not present

## 2016-02-03 DIAGNOSIS — M6281 Muscle weakness (generalized): Secondary | ICD-10-CM | POA: Diagnosis not present

## 2016-02-03 DIAGNOSIS — R296 Repeated falls: Secondary | ICD-10-CM | POA: Diagnosis not present

## 2016-02-09 DIAGNOSIS — M6281 Muscle weakness (generalized): Secondary | ICD-10-CM | POA: Diagnosis not present

## 2016-02-09 DIAGNOSIS — R296 Repeated falls: Secondary | ICD-10-CM | POA: Diagnosis not present

## 2016-02-11 DIAGNOSIS — M6281 Muscle weakness (generalized): Secondary | ICD-10-CM | POA: Diagnosis not present

## 2016-02-11 DIAGNOSIS — R296 Repeated falls: Secondary | ICD-10-CM | POA: Diagnosis not present

## 2016-02-14 DIAGNOSIS — M25521 Pain in right elbow: Secondary | ICD-10-CM | POA: Diagnosis not present

## 2016-02-14 DIAGNOSIS — Z6827 Body mass index (BMI) 27.0-27.9, adult: Secondary | ICD-10-CM | POA: Diagnosis not present

## 2016-02-16 DIAGNOSIS — H353132 Nonexudative age-related macular degeneration, bilateral, intermediate dry stage: Secondary | ICD-10-CM | POA: Diagnosis not present

## 2016-02-16 DIAGNOSIS — Z961 Presence of intraocular lens: Secondary | ICD-10-CM | POA: Diagnosis not present

## 2016-02-16 DIAGNOSIS — H3561 Retinal hemorrhage, right eye: Secondary | ICD-10-CM | POA: Diagnosis not present

## 2016-02-16 DIAGNOSIS — H35721 Serous detachment of retinal pigment epithelium, right eye: Secondary | ICD-10-CM | POA: Diagnosis not present

## 2016-02-17 DIAGNOSIS — H353211 Exudative age-related macular degeneration, right eye, with active choroidal neovascularization: Secondary | ICD-10-CM | POA: Diagnosis not present

## 2016-02-17 DIAGNOSIS — H353122 Nonexudative age-related macular degeneration, left eye, intermediate dry stage: Secondary | ICD-10-CM | POA: Diagnosis not present

## 2016-02-17 DIAGNOSIS — H43813 Vitreous degeneration, bilateral: Secondary | ICD-10-CM | POA: Diagnosis not present

## 2016-02-17 DIAGNOSIS — H35423 Microcystoid degeneration of retina, bilateral: Secondary | ICD-10-CM | POA: Diagnosis not present

## 2016-02-28 DIAGNOSIS — M25521 Pain in right elbow: Secondary | ICD-10-CM | POA: Diagnosis not present

## 2016-03-06 DIAGNOSIS — D519 Vitamin B12 deficiency anemia, unspecified: Secondary | ICD-10-CM | POA: Diagnosis not present

## 2016-03-10 DIAGNOSIS — R296 Repeated falls: Secondary | ICD-10-CM | POA: Diagnosis not present

## 2016-03-10 DIAGNOSIS — M6281 Muscle weakness (generalized): Secondary | ICD-10-CM | POA: Diagnosis not present

## 2016-03-14 DIAGNOSIS — M6281 Muscle weakness (generalized): Secondary | ICD-10-CM | POA: Diagnosis not present

## 2016-03-14 DIAGNOSIS — R296 Repeated falls: Secondary | ICD-10-CM | POA: Diagnosis not present

## 2016-03-16 DIAGNOSIS — R296 Repeated falls: Secondary | ICD-10-CM | POA: Diagnosis not present

## 2016-03-16 DIAGNOSIS — M6281 Muscle weakness (generalized): Secondary | ICD-10-CM | POA: Diagnosis not present

## 2016-03-20 DIAGNOSIS — H35423 Microcystoid degeneration of retina, bilateral: Secondary | ICD-10-CM | POA: Diagnosis not present

## 2016-03-20 DIAGNOSIS — H353211 Exudative age-related macular degeneration, right eye, with active choroidal neovascularization: Secondary | ICD-10-CM | POA: Diagnosis not present

## 2016-03-20 DIAGNOSIS — H43813 Vitreous degeneration, bilateral: Secondary | ICD-10-CM | POA: Diagnosis not present

## 2016-03-20 DIAGNOSIS — H353122 Nonexudative age-related macular degeneration, left eye, intermediate dry stage: Secondary | ICD-10-CM | POA: Diagnosis not present

## 2016-03-22 DIAGNOSIS — M6281 Muscle weakness (generalized): Secondary | ICD-10-CM | POA: Diagnosis not present

## 2016-03-22 DIAGNOSIS — R296 Repeated falls: Secondary | ICD-10-CM | POA: Diagnosis not present

## 2016-03-24 DIAGNOSIS — R296 Repeated falls: Secondary | ICD-10-CM | POA: Diagnosis not present

## 2016-03-24 DIAGNOSIS — M6281 Muscle weakness (generalized): Secondary | ICD-10-CM | POA: Diagnosis not present

## 2016-03-27 DIAGNOSIS — I739 Peripheral vascular disease, unspecified: Secondary | ICD-10-CM | POA: Diagnosis not present

## 2016-03-28 DIAGNOSIS — R296 Repeated falls: Secondary | ICD-10-CM | POA: Diagnosis not present

## 2016-03-28 DIAGNOSIS — M6281 Muscle weakness (generalized): Secondary | ICD-10-CM | POA: Diagnosis not present

## 2016-03-31 DIAGNOSIS — R296 Repeated falls: Secondary | ICD-10-CM | POA: Diagnosis not present

## 2016-03-31 DIAGNOSIS — M6281 Muscle weakness (generalized): Secondary | ICD-10-CM | POA: Diagnosis not present

## 2016-04-06 DIAGNOSIS — R296 Repeated falls: Secondary | ICD-10-CM | POA: Diagnosis not present

## 2016-04-06 DIAGNOSIS — M6281 Muscle weakness (generalized): Secondary | ICD-10-CM | POA: Diagnosis not present

## 2016-04-07 DIAGNOSIS — D519 Vitamin B12 deficiency anemia, unspecified: Secondary | ICD-10-CM | POA: Diagnosis not present

## 2016-04-12 DIAGNOSIS — R296 Repeated falls: Secondary | ICD-10-CM | POA: Diagnosis not present

## 2016-04-12 DIAGNOSIS — M6281 Muscle weakness (generalized): Secondary | ICD-10-CM | POA: Diagnosis not present

## 2016-04-13 DIAGNOSIS — R296 Repeated falls: Secondary | ICD-10-CM | POA: Diagnosis not present

## 2016-04-13 DIAGNOSIS — M6281 Muscle weakness (generalized): Secondary | ICD-10-CM | POA: Diagnosis not present

## 2016-04-18 DIAGNOSIS — I82402 Acute embolism and thrombosis of unspecified deep veins of left lower extremity: Secondary | ICD-10-CM | POA: Diagnosis not present

## 2016-04-18 DIAGNOSIS — E782 Mixed hyperlipidemia: Secondary | ICD-10-CM | POA: Diagnosis not present

## 2016-04-18 DIAGNOSIS — D519 Vitamin B12 deficiency anemia, unspecified: Secondary | ICD-10-CM | POA: Diagnosis not present

## 2016-04-19 DIAGNOSIS — R296 Repeated falls: Secondary | ICD-10-CM | POA: Diagnosis not present

## 2016-04-19 DIAGNOSIS — M6281 Muscle weakness (generalized): Secondary | ICD-10-CM | POA: Diagnosis not present

## 2016-04-20 DIAGNOSIS — M6281 Muscle weakness (generalized): Secondary | ICD-10-CM | POA: Diagnosis not present

## 2016-04-20 DIAGNOSIS — R296 Repeated falls: Secondary | ICD-10-CM | POA: Diagnosis not present

## 2016-04-21 DIAGNOSIS — E782 Mixed hyperlipidemia: Secondary | ICD-10-CM | POA: Diagnosis not present

## 2016-04-21 DIAGNOSIS — F419 Anxiety disorder, unspecified: Secondary | ICD-10-CM | POA: Diagnosis not present

## 2016-04-21 DIAGNOSIS — I5031 Acute diastolic (congestive) heart failure: Secondary | ICD-10-CM | POA: Diagnosis not present

## 2016-04-21 DIAGNOSIS — D519 Vitamin B12 deficiency anemia, unspecified: Secondary | ICD-10-CM | POA: Diagnosis not present

## 2016-04-21 DIAGNOSIS — F329 Major depressive disorder, single episode, unspecified: Secondary | ICD-10-CM | POA: Diagnosis not present

## 2016-04-21 DIAGNOSIS — R21 Rash and other nonspecific skin eruption: Secondary | ICD-10-CM | POA: Diagnosis not present

## 2016-04-21 DIAGNOSIS — Z86718 Personal history of other venous thrombosis and embolism: Secondary | ICD-10-CM | POA: Diagnosis not present

## 2016-04-25 DIAGNOSIS — R296 Repeated falls: Secondary | ICD-10-CM | POA: Diagnosis not present

## 2016-04-25 DIAGNOSIS — M6281 Muscle weakness (generalized): Secondary | ICD-10-CM | POA: Diagnosis not present

## 2016-05-01 DIAGNOSIS — R296 Repeated falls: Secondary | ICD-10-CM | POA: Diagnosis not present

## 2016-05-01 DIAGNOSIS — M6281 Muscle weakness (generalized): Secondary | ICD-10-CM | POA: Diagnosis not present

## 2016-05-03 DIAGNOSIS — M6281 Muscle weakness (generalized): Secondary | ICD-10-CM | POA: Diagnosis not present

## 2016-05-03 DIAGNOSIS — R296 Repeated falls: Secondary | ICD-10-CM | POA: Diagnosis not present

## 2016-05-04 DIAGNOSIS — H353123 Nonexudative age-related macular degeneration, left eye, advanced atrophic without subfoveal involvement: Secondary | ICD-10-CM | POA: Diagnosis not present

## 2016-05-04 DIAGNOSIS — H353211 Exudative age-related macular degeneration, right eye, with active choroidal neovascularization: Secondary | ICD-10-CM | POA: Diagnosis not present

## 2016-05-04 DIAGNOSIS — H43813 Vitreous degeneration, bilateral: Secondary | ICD-10-CM | POA: Diagnosis not present

## 2016-05-04 DIAGNOSIS — H35423 Microcystoid degeneration of retina, bilateral: Secondary | ICD-10-CM | POA: Diagnosis not present

## 2016-05-08 DIAGNOSIS — R296 Repeated falls: Secondary | ICD-10-CM | POA: Diagnosis not present

## 2016-05-08 DIAGNOSIS — M6281 Muscle weakness (generalized): Secondary | ICD-10-CM | POA: Diagnosis not present

## 2016-05-10 DIAGNOSIS — M6281 Muscle weakness (generalized): Secondary | ICD-10-CM | POA: Diagnosis not present

## 2016-05-10 DIAGNOSIS — R296 Repeated falls: Secondary | ICD-10-CM | POA: Diagnosis not present

## 2016-05-16 DIAGNOSIS — M6281 Muscle weakness (generalized): Secondary | ICD-10-CM | POA: Diagnosis not present

## 2016-05-16 DIAGNOSIS — R296 Repeated falls: Secondary | ICD-10-CM | POA: Diagnosis not present

## 2016-05-19 DIAGNOSIS — R296 Repeated falls: Secondary | ICD-10-CM | POA: Diagnosis not present

## 2016-05-19 DIAGNOSIS — M6281 Muscle weakness (generalized): Secondary | ICD-10-CM | POA: Diagnosis not present

## 2016-05-23 DIAGNOSIS — R296 Repeated falls: Secondary | ICD-10-CM | POA: Diagnosis not present

## 2016-05-23 DIAGNOSIS — M6281 Muscle weakness (generalized): Secondary | ICD-10-CM | POA: Diagnosis not present

## 2016-05-26 DIAGNOSIS — R296 Repeated falls: Secondary | ICD-10-CM | POA: Diagnosis not present

## 2016-05-26 DIAGNOSIS — M6281 Muscle weakness (generalized): Secondary | ICD-10-CM | POA: Diagnosis not present

## 2016-05-29 DIAGNOSIS — R296 Repeated falls: Secondary | ICD-10-CM | POA: Diagnosis not present

## 2016-05-29 DIAGNOSIS — M6281 Muscle weakness (generalized): Secondary | ICD-10-CM | POA: Diagnosis not present

## 2016-06-01 DIAGNOSIS — M6281 Muscle weakness (generalized): Secondary | ICD-10-CM | POA: Diagnosis not present

## 2016-06-01 DIAGNOSIS — R296 Repeated falls: Secondary | ICD-10-CM | POA: Diagnosis not present

## 2016-06-07 DIAGNOSIS — D519 Vitamin B12 deficiency anemia, unspecified: Secondary | ICD-10-CM | POA: Diagnosis not present

## 2016-06-12 DIAGNOSIS — I739 Peripheral vascular disease, unspecified: Secondary | ICD-10-CM | POA: Diagnosis not present

## 2016-06-12 DIAGNOSIS — B351 Tinea unguium: Secondary | ICD-10-CM | POA: Diagnosis not present

## 2016-06-12 DIAGNOSIS — M79671 Pain in right foot: Secondary | ICD-10-CM | POA: Diagnosis not present

## 2016-06-12 DIAGNOSIS — M79672 Pain in left foot: Secondary | ICD-10-CM | POA: Diagnosis not present

## 2016-06-22 DIAGNOSIS — H35423 Microcystoid degeneration of retina, bilateral: Secondary | ICD-10-CM | POA: Diagnosis not present

## 2016-06-22 DIAGNOSIS — H353123 Nonexudative age-related macular degeneration, left eye, advanced atrophic without subfoveal involvement: Secondary | ICD-10-CM | POA: Diagnosis not present

## 2016-06-22 DIAGNOSIS — H43813 Vitreous degeneration, bilateral: Secondary | ICD-10-CM | POA: Diagnosis not present

## 2016-06-22 DIAGNOSIS — H353211 Exudative age-related macular degeneration, right eye, with active choroidal neovascularization: Secondary | ICD-10-CM | POA: Diagnosis not present

## 2016-07-05 DIAGNOSIS — D519 Vitamin B12 deficiency anemia, unspecified: Secondary | ICD-10-CM | POA: Diagnosis not present

## 2016-08-04 DIAGNOSIS — D519 Vitamin B12 deficiency anemia, unspecified: Secondary | ICD-10-CM | POA: Diagnosis not present

## 2016-08-17 DIAGNOSIS — H43813 Vitreous degeneration, bilateral: Secondary | ICD-10-CM | POA: Diagnosis not present

## 2016-08-17 DIAGNOSIS — H353123 Nonexudative age-related macular degeneration, left eye, advanced atrophic without subfoveal involvement: Secondary | ICD-10-CM | POA: Diagnosis not present

## 2016-08-17 DIAGNOSIS — H353211 Exudative age-related macular degeneration, right eye, with active choroidal neovascularization: Secondary | ICD-10-CM | POA: Diagnosis not present

## 2016-08-17 DIAGNOSIS — H35423 Microcystoid degeneration of retina, bilateral: Secondary | ICD-10-CM | POA: Diagnosis not present

## 2016-08-21 DIAGNOSIS — I739 Peripheral vascular disease, unspecified: Secondary | ICD-10-CM | POA: Diagnosis not present

## 2016-09-01 DIAGNOSIS — D519 Vitamin B12 deficiency anemia, unspecified: Secondary | ICD-10-CM | POA: Diagnosis not present

## 2016-09-28 DIAGNOSIS — H43813 Vitreous degeneration, bilateral: Secondary | ICD-10-CM | POA: Diagnosis not present

## 2016-09-28 DIAGNOSIS — H353123 Nonexudative age-related macular degeneration, left eye, advanced atrophic without subfoveal involvement: Secondary | ICD-10-CM | POA: Diagnosis not present

## 2016-09-28 DIAGNOSIS — H35423 Microcystoid degeneration of retina, bilateral: Secondary | ICD-10-CM | POA: Diagnosis not present

## 2016-09-28 DIAGNOSIS — H353211 Exudative age-related macular degeneration, right eye, with active choroidal neovascularization: Secondary | ICD-10-CM | POA: Diagnosis not present

## 2016-10-03 DIAGNOSIS — D519 Vitamin B12 deficiency anemia, unspecified: Secondary | ICD-10-CM | POA: Diagnosis not present

## 2016-10-16 DIAGNOSIS — I5031 Acute diastolic (congestive) heart failure: Secondary | ICD-10-CM | POA: Diagnosis not present

## 2016-10-16 DIAGNOSIS — E782 Mixed hyperlipidemia: Secondary | ICD-10-CM | POA: Diagnosis not present

## 2016-10-16 DIAGNOSIS — I82402 Acute embolism and thrombosis of unspecified deep veins of left lower extremity: Secondary | ICD-10-CM | POA: Diagnosis not present

## 2016-10-16 DIAGNOSIS — F329 Major depressive disorder, single episode, unspecified: Secondary | ICD-10-CM | POA: Diagnosis not present

## 2016-10-18 DIAGNOSIS — Z23 Encounter for immunization: Secondary | ICD-10-CM | POA: Diagnosis not present

## 2016-10-18 DIAGNOSIS — I5031 Acute diastolic (congestive) heart failure: Secondary | ICD-10-CM | POA: Diagnosis not present

## 2016-10-18 DIAGNOSIS — D519 Vitamin B12 deficiency anemia, unspecified: Secondary | ICD-10-CM | POA: Diagnosis not present

## 2016-10-18 DIAGNOSIS — R21 Rash and other nonspecific skin eruption: Secondary | ICD-10-CM | POA: Diagnosis not present

## 2016-10-18 DIAGNOSIS — Z6826 Body mass index (BMI) 26.0-26.9, adult: Secondary | ICD-10-CM | POA: Diagnosis not present

## 2016-10-18 DIAGNOSIS — Z86718 Personal history of other venous thrombosis and embolism: Secondary | ICD-10-CM | POA: Diagnosis not present

## 2016-10-18 DIAGNOSIS — F419 Anxiety disorder, unspecified: Secondary | ICD-10-CM | POA: Diagnosis not present

## 2016-10-18 DIAGNOSIS — F329 Major depressive disorder, single episode, unspecified: Secondary | ICD-10-CM | POA: Diagnosis not present

## 2016-10-18 DIAGNOSIS — E782 Mixed hyperlipidemia: Secondary | ICD-10-CM | POA: Diagnosis not present

## 2016-11-02 DIAGNOSIS — D519 Vitamin B12 deficiency anemia, unspecified: Secondary | ICD-10-CM | POA: Diagnosis not present

## 2016-11-06 DIAGNOSIS — H353123 Nonexudative age-related macular degeneration, left eye, advanced atrophic without subfoveal involvement: Secondary | ICD-10-CM | POA: Diagnosis not present

## 2016-11-06 DIAGNOSIS — I739 Peripheral vascular disease, unspecified: Secondary | ICD-10-CM | POA: Diagnosis not present

## 2016-11-06 DIAGNOSIS — H353211 Exudative age-related macular degeneration, right eye, with active choroidal neovascularization: Secondary | ICD-10-CM | POA: Diagnosis not present

## 2016-11-06 DIAGNOSIS — H35423 Microcystoid degeneration of retina, bilateral: Secondary | ICD-10-CM | POA: Diagnosis not present

## 2016-11-06 DIAGNOSIS — H43813 Vitreous degeneration, bilateral: Secondary | ICD-10-CM | POA: Diagnosis not present

## 2016-12-01 DIAGNOSIS — D519 Vitamin B12 deficiency anemia, unspecified: Secondary | ICD-10-CM | POA: Diagnosis not present

## 2016-12-12 DIAGNOSIS — M25579 Pain in unspecified ankle and joints of unspecified foot: Secondary | ICD-10-CM | POA: Diagnosis not present

## 2016-12-12 DIAGNOSIS — M79672 Pain in left foot: Secondary | ICD-10-CM | POA: Diagnosis not present

## 2016-12-14 DIAGNOSIS — H353123 Nonexudative age-related macular degeneration, left eye, advanced atrophic without subfoveal involvement: Secondary | ICD-10-CM | POA: Diagnosis not present

## 2016-12-14 DIAGNOSIS — H353211 Exudative age-related macular degeneration, right eye, with active choroidal neovascularization: Secondary | ICD-10-CM | POA: Diagnosis not present

## 2016-12-14 DIAGNOSIS — H43813 Vitreous degeneration, bilateral: Secondary | ICD-10-CM | POA: Diagnosis not present

## 2016-12-14 DIAGNOSIS — H35423 Microcystoid degeneration of retina, bilateral: Secondary | ICD-10-CM | POA: Diagnosis not present

## 2016-12-19 DIAGNOSIS — Z23 Encounter for immunization: Secondary | ICD-10-CM | POA: Diagnosis not present

## 2017-01-02 DIAGNOSIS — D519 Vitamin B12 deficiency anemia, unspecified: Secondary | ICD-10-CM | POA: Diagnosis not present

## 2017-01-16 DIAGNOSIS — I739 Peripheral vascular disease, unspecified: Secondary | ICD-10-CM | POA: Diagnosis not present

## 2017-01-18 DIAGNOSIS — H43813 Vitreous degeneration, bilateral: Secondary | ICD-10-CM | POA: Diagnosis not present

## 2017-01-18 DIAGNOSIS — H353123 Nonexudative age-related macular degeneration, left eye, advanced atrophic without subfoveal involvement: Secondary | ICD-10-CM | POA: Diagnosis not present

## 2017-01-18 DIAGNOSIS — H353211 Exudative age-related macular degeneration, right eye, with active choroidal neovascularization: Secondary | ICD-10-CM | POA: Diagnosis not present

## 2017-01-18 DIAGNOSIS — H35423 Microcystoid degeneration of retina, bilateral: Secondary | ICD-10-CM | POA: Diagnosis not present

## 2017-02-01 DIAGNOSIS — D519 Vitamin B12 deficiency anemia, unspecified: Secondary | ICD-10-CM | POA: Diagnosis not present

## 2017-02-26 DIAGNOSIS — H43813 Vitreous degeneration, bilateral: Secondary | ICD-10-CM | POA: Diagnosis not present

## 2017-02-26 DIAGNOSIS — H353211 Exudative age-related macular degeneration, right eye, with active choroidal neovascularization: Secondary | ICD-10-CM | POA: Diagnosis not present

## 2017-02-26 DIAGNOSIS — H353123 Nonexudative age-related macular degeneration, left eye, advanced atrophic without subfoveal involvement: Secondary | ICD-10-CM | POA: Diagnosis not present

## 2017-02-26 DIAGNOSIS — H35423 Microcystoid degeneration of retina, bilateral: Secondary | ICD-10-CM | POA: Diagnosis not present

## 2017-02-28 DIAGNOSIS — D519 Vitamin B12 deficiency anemia, unspecified: Secondary | ICD-10-CM | POA: Diagnosis not present

## 2017-03-06 DIAGNOSIS — Z961 Presence of intraocular lens: Secondary | ICD-10-CM | POA: Diagnosis not present

## 2017-03-06 DIAGNOSIS — H353121 Nonexudative age-related macular degeneration, left eye, early dry stage: Secondary | ICD-10-CM | POA: Diagnosis not present

## 2017-03-06 DIAGNOSIS — H353211 Exudative age-related macular degeneration, right eye, with active choroidal neovascularization: Secondary | ICD-10-CM | POA: Diagnosis not present

## 2017-03-06 DIAGNOSIS — H401131 Primary open-angle glaucoma, bilateral, mild stage: Secondary | ICD-10-CM | POA: Diagnosis not present

## 2017-03-20 DIAGNOSIS — M79672 Pain in left foot: Secondary | ICD-10-CM | POA: Diagnosis not present

## 2017-03-20 DIAGNOSIS — M25579 Pain in unspecified ankle and joints of unspecified foot: Secondary | ICD-10-CM | POA: Diagnosis not present

## 2017-03-27 DIAGNOSIS — I739 Peripheral vascular disease, unspecified: Secondary | ICD-10-CM | POA: Diagnosis not present

## 2017-04-05 DIAGNOSIS — H35423 Microcystoid degeneration of retina, bilateral: Secondary | ICD-10-CM | POA: Diagnosis not present

## 2017-04-05 DIAGNOSIS — H353211 Exudative age-related macular degeneration, right eye, with active choroidal neovascularization: Secondary | ICD-10-CM | POA: Diagnosis not present

## 2017-04-05 DIAGNOSIS — H43813 Vitreous degeneration, bilateral: Secondary | ICD-10-CM | POA: Diagnosis not present

## 2017-04-05 DIAGNOSIS — H353123 Nonexudative age-related macular degeneration, left eye, advanced atrophic without subfoveal involvement: Secondary | ICD-10-CM | POA: Diagnosis not present

## 2017-04-06 DIAGNOSIS — D519 Vitamin B12 deficiency anemia, unspecified: Secondary | ICD-10-CM | POA: Diagnosis not present

## 2017-04-06 DIAGNOSIS — I82402 Acute embolism and thrombosis of unspecified deep veins of left lower extremity: Secondary | ICD-10-CM | POA: Diagnosis not present

## 2017-04-06 DIAGNOSIS — F329 Major depressive disorder, single episode, unspecified: Secondary | ICD-10-CM | POA: Diagnosis not present

## 2017-04-06 DIAGNOSIS — E782 Mixed hyperlipidemia: Secondary | ICD-10-CM | POA: Diagnosis not present

## 2017-04-11 DIAGNOSIS — Z6826 Body mass index (BMI) 26.0-26.9, adult: Secondary | ICD-10-CM | POA: Diagnosis not present

## 2017-04-11 DIAGNOSIS — R21 Rash and other nonspecific skin eruption: Secondary | ICD-10-CM | POA: Diagnosis not present

## 2017-04-11 DIAGNOSIS — I5031 Acute diastolic (congestive) heart failure: Secondary | ICD-10-CM | POA: Diagnosis not present

## 2017-04-11 DIAGNOSIS — E782 Mixed hyperlipidemia: Secondary | ICD-10-CM | POA: Diagnosis not present

## 2017-04-11 DIAGNOSIS — Z86718 Personal history of other venous thrombosis and embolism: Secondary | ICD-10-CM | POA: Diagnosis not present

## 2017-04-11 DIAGNOSIS — F329 Major depressive disorder, single episode, unspecified: Secondary | ICD-10-CM | POA: Diagnosis not present

## 2017-04-11 DIAGNOSIS — F419 Anxiety disorder, unspecified: Secondary | ICD-10-CM | POA: Diagnosis not present

## 2017-04-11 DIAGNOSIS — D519 Vitamin B12 deficiency anemia, unspecified: Secondary | ICD-10-CM | POA: Diagnosis not present

## 2017-05-01 DIAGNOSIS — H16142 Punctate keratitis, left eye: Secondary | ICD-10-CM | POA: Diagnosis not present

## 2017-05-01 DIAGNOSIS — S0502XA Injury of conjunctiva and corneal abrasion without foreign body, left eye, initial encounter: Secondary | ICD-10-CM | POA: Diagnosis not present

## 2017-05-01 DIAGNOSIS — H353132 Nonexudative age-related macular degeneration, bilateral, intermediate dry stage: Secondary | ICD-10-CM | POA: Diagnosis not present

## 2017-05-01 DIAGNOSIS — H16223 Keratoconjunctivitis sicca, not specified as Sjogren's, bilateral: Secondary | ICD-10-CM | POA: Diagnosis not present

## 2017-05-07 DIAGNOSIS — D519 Vitamin B12 deficiency anemia, unspecified: Secondary | ICD-10-CM | POA: Diagnosis not present

## 2017-05-17 DIAGNOSIS — H353211 Exudative age-related macular degeneration, right eye, with active choroidal neovascularization: Secondary | ICD-10-CM | POA: Diagnosis not present

## 2017-05-17 DIAGNOSIS — H43813 Vitreous degeneration, bilateral: Secondary | ICD-10-CM | POA: Diagnosis not present

## 2017-05-17 DIAGNOSIS — H353122 Nonexudative age-related macular degeneration, left eye, intermediate dry stage: Secondary | ICD-10-CM | POA: Diagnosis not present

## 2017-05-17 DIAGNOSIS — H35032 Hypertensive retinopathy, left eye: Secondary | ICD-10-CM | POA: Diagnosis not present

## 2017-05-28 DIAGNOSIS — M79672 Pain in left foot: Secondary | ICD-10-CM | POA: Diagnosis not present

## 2017-05-28 DIAGNOSIS — M25579 Pain in unspecified ankle and joints of unspecified foot: Secondary | ICD-10-CM | POA: Diagnosis not present

## 2017-06-06 DIAGNOSIS — D519 Vitamin B12 deficiency anemia, unspecified: Secondary | ICD-10-CM | POA: Diagnosis not present

## 2017-06-12 DIAGNOSIS — I739 Peripheral vascular disease, unspecified: Secondary | ICD-10-CM | POA: Diagnosis not present

## 2017-07-03 DIAGNOSIS — D519 Vitamin B12 deficiency anemia, unspecified: Secondary | ICD-10-CM | POA: Diagnosis not present

## 2017-07-12 DIAGNOSIS — H35423 Microcystoid degeneration of retina, bilateral: Secondary | ICD-10-CM | POA: Diagnosis not present

## 2017-07-12 DIAGNOSIS — H353211 Exudative age-related macular degeneration, right eye, with active choroidal neovascularization: Secondary | ICD-10-CM | POA: Diagnosis not present

## 2017-07-12 DIAGNOSIS — H35032 Hypertensive retinopathy, left eye: Secondary | ICD-10-CM | POA: Diagnosis not present

## 2017-07-12 DIAGNOSIS — H353123 Nonexudative age-related macular degeneration, left eye, advanced atrophic without subfoveal involvement: Secondary | ICD-10-CM | POA: Diagnosis not present

## 2017-07-18 DIAGNOSIS — D519 Vitamin B12 deficiency anemia, unspecified: Secondary | ICD-10-CM | POA: Diagnosis not present

## 2017-07-18 DIAGNOSIS — Z86718 Personal history of other venous thrombosis and embolism: Secondary | ICD-10-CM | POA: Diagnosis not present

## 2017-07-18 DIAGNOSIS — F419 Anxiety disorder, unspecified: Secondary | ICD-10-CM | POA: Diagnosis not present

## 2017-07-18 DIAGNOSIS — I5031 Acute diastolic (congestive) heart failure: Secondary | ICD-10-CM | POA: Diagnosis not present

## 2017-07-18 DIAGNOSIS — E782 Mixed hyperlipidemia: Secondary | ICD-10-CM | POA: Diagnosis not present

## 2017-07-18 DIAGNOSIS — R21 Rash and other nonspecific skin eruption: Secondary | ICD-10-CM | POA: Diagnosis not present

## 2017-07-18 DIAGNOSIS — Z6826 Body mass index (BMI) 26.0-26.9, adult: Secondary | ICD-10-CM | POA: Diagnosis not present

## 2017-07-18 DIAGNOSIS — F329 Major depressive disorder, single episode, unspecified: Secondary | ICD-10-CM | POA: Diagnosis not present

## 2017-07-31 DIAGNOSIS — D519 Vitamin B12 deficiency anemia, unspecified: Secondary | ICD-10-CM | POA: Diagnosis not present

## 2017-08-14 DIAGNOSIS — I739 Peripheral vascular disease, unspecified: Secondary | ICD-10-CM | POA: Diagnosis not present

## 2017-08-20 ENCOUNTER — Ambulatory Visit (HOSPITAL_COMMUNITY)
Admission: RE | Admit: 2017-08-20 | Discharge: 2017-08-20 | Disposition: A | Discharge status: Home / Self Care | Payer: Medicare Other | Source: Ambulatory Visit | Attending: Family Medicine | Admitting: Family Medicine

## 2017-08-20 ENCOUNTER — Other Ambulatory Visit (HOSPITAL_COMMUNITY): Payer: Self-pay | Admitting: Family Medicine

## 2017-08-20 ENCOUNTER — Other Ambulatory Visit (HOSPITAL_COMMUNITY)
Admission: RE | Admit: 2017-08-20 | Discharge: 2017-08-20 | Disposition: A | Discharge status: Home / Self Care | Payer: Medicare Other | Source: Ambulatory Visit | Attending: Family Medicine | Admitting: Family Medicine

## 2017-08-20 DIAGNOSIS — R509 Fever, unspecified: Secondary | ICD-10-CM | POA: Insufficient documentation

## 2017-08-20 DIAGNOSIS — K649 Unspecified hemorrhoids: Secondary | ICD-10-CM | POA: Diagnosis not present

## 2017-08-20 DIAGNOSIS — I82509 Chronic embolism and thrombosis of unspecified deep veins of unspecified lower extremity: Secondary | ICD-10-CM | POA: Diagnosis not present

## 2017-08-20 DIAGNOSIS — M79662 Pain in left lower leg: Secondary | ICD-10-CM

## 2017-08-20 LAB — COMPREHENSIVE METABOLIC PANEL
ALBUMIN: 3.6 g/dL (ref 3.5–5.0)
ALT: 11 U/L — AB (ref 14–54)
AST: 18 U/L (ref 15–41)
Alkaline Phosphatase: 53 U/L (ref 38–126)
Anion gap: 10 (ref 5–15)
BUN: 17 mg/dL (ref 6–20)
CHLORIDE: 97 mmol/L — AB (ref 101–111)
CO2: 27 mmol/L (ref 22–32)
CREATININE: 0.82 mg/dL (ref 0.44–1.00)
Calcium: 8.6 mg/dL — ABNORMAL LOW (ref 8.9–10.3)
GFR calc non Af Amer: >60 mL/min (ref >60)
GLUCOSE: 98 mg/dL (ref 65–99)
Potassium: 4 mmol/L (ref 3.5–5.1)
SODIUM: 134 mmol/L — AB (ref 135–145)
Total Bilirubin: 0.9 mg/dL (ref 0.3–1.2)
Total Protein: 7.2 g/dL (ref 6.5–8.1)

## 2017-08-20 LAB — CBC WITH DIFFERENTIAL/PLATELET
Basophils Absolute: 0 10*3/uL (ref 0.0–0.1)
Basophils Relative: 0 %
EOS ABS: 0.2 10*3/uL (ref 0.0–0.7)
Eosinophils Relative: 2 %
HCT: 37.6 % (ref 36.0–46.0)
HEMOGLOBIN: 11.9 g/dL — AB (ref 12.0–15.0)
LYMPHS ABS: 1.6 10*3/uL (ref 0.7–4.0)
Lymphocytes Relative: 17 %
MCH: 27.7 pg (ref 26.0–34.0)
MCHC: 31.6 g/dL (ref 30.0–36.0)
MCV: 87.4 fL (ref 78.0–100.0)
MONO ABS: 0.9 10*3/uL (ref 0.1–1.0)
MONOS PCT: 10 %
NEUTROS ABS: 7 10*3/uL (ref 1.7–7.7)
Neutrophils Relative %: 71 %
PLATELETS: 220 10*3/uL (ref 150–400)
RBC: 4.3 MIL/uL (ref 3.87–5.11)
RDW: 12.9 % (ref 11.5–15.5)
WBC: 9.8 10*3/uL (ref 4.0–10.5)

## 2017-08-21 ENCOUNTER — Ambulatory Visit (HOSPITAL_COMMUNITY): Payer: Medicare Other

## 2017-08-21 DIAGNOSIS — R509 Fever, unspecified: Secondary | ICD-10-CM | POA: Diagnosis not present

## 2017-08-23 DIAGNOSIS — H35423 Microcystoid degeneration of retina, bilateral: Secondary | ICD-10-CM | POA: Diagnosis not present

## 2017-08-23 DIAGNOSIS — H35032 Hypertensive retinopathy, left eye: Secondary | ICD-10-CM | POA: Diagnosis not present

## 2017-08-23 DIAGNOSIS — H353211 Exudative age-related macular degeneration, right eye, with active choroidal neovascularization: Secondary | ICD-10-CM | POA: Diagnosis not present

## 2017-08-23 DIAGNOSIS — H353123 Nonexudative age-related macular degeneration, left eye, advanced atrophic without subfoveal involvement: Secondary | ICD-10-CM | POA: Diagnosis not present

## 2017-08-30 DIAGNOSIS — D519 Vitamin B12 deficiency anemia, unspecified: Secondary | ICD-10-CM | POA: Diagnosis not present

## 2017-09-04 DIAGNOSIS — M25579 Pain in unspecified ankle and joints of unspecified foot: Secondary | ICD-10-CM | POA: Diagnosis not present

## 2017-09-04 DIAGNOSIS — M79672 Pain in left foot: Secondary | ICD-10-CM | POA: Diagnosis not present

## 2017-10-01 DIAGNOSIS — D519 Vitamin B12 deficiency anemia, unspecified: Secondary | ICD-10-CM | POA: Diagnosis not present

## 2017-10-05 DIAGNOSIS — E782 Mixed hyperlipidemia: Secondary | ICD-10-CM | POA: Diagnosis not present

## 2017-10-05 DIAGNOSIS — F419 Anxiety disorder, unspecified: Secondary | ICD-10-CM | POA: Diagnosis not present

## 2017-10-05 DIAGNOSIS — I82509 Chronic embolism and thrombosis of unspecified deep veins of unspecified lower extremity: Secondary | ICD-10-CM | POA: Diagnosis not present

## 2017-10-05 DIAGNOSIS — D519 Vitamin B12 deficiency anemia, unspecified: Secondary | ICD-10-CM | POA: Diagnosis not present

## 2017-10-08 DIAGNOSIS — H35423 Microcystoid degeneration of retina, bilateral: Secondary | ICD-10-CM | POA: Diagnosis not present

## 2017-10-08 DIAGNOSIS — H35032 Hypertensive retinopathy, left eye: Secondary | ICD-10-CM | POA: Diagnosis not present

## 2017-10-08 DIAGNOSIS — H353123 Nonexudative age-related macular degeneration, left eye, advanced atrophic without subfoveal involvement: Secondary | ICD-10-CM | POA: Diagnosis not present

## 2017-10-08 DIAGNOSIS — H353211 Exudative age-related macular degeneration, right eye, with active choroidal neovascularization: Secondary | ICD-10-CM | POA: Diagnosis not present

## 2017-10-10 DIAGNOSIS — Z6826 Body mass index (BMI) 26.0-26.9, adult: Secondary | ICD-10-CM | POA: Diagnosis not present

## 2017-10-10 DIAGNOSIS — I5031 Acute diastolic (congestive) heart failure: Secondary | ICD-10-CM | POA: Diagnosis not present

## 2017-10-10 DIAGNOSIS — E782 Mixed hyperlipidemia: Secondary | ICD-10-CM | POA: Diagnosis not present

## 2017-10-10 DIAGNOSIS — Z1389 Encounter for screening for other disorder: Secondary | ICD-10-CM | POA: Diagnosis not present

## 2017-10-10 DIAGNOSIS — R21 Rash and other nonspecific skin eruption: Secondary | ICD-10-CM | POA: Diagnosis not present

## 2017-10-10 DIAGNOSIS — Z1331 Encounter for screening for depression: Secondary | ICD-10-CM | POA: Diagnosis not present

## 2017-10-10 DIAGNOSIS — D519 Vitamin B12 deficiency anemia, unspecified: Secondary | ICD-10-CM | POA: Diagnosis not present

## 2017-10-10 DIAGNOSIS — Z86718 Personal history of other venous thrombosis and embolism: Secondary | ICD-10-CM | POA: Diagnosis not present

## 2017-10-30 DIAGNOSIS — I739 Peripheral vascular disease, unspecified: Secondary | ICD-10-CM | POA: Diagnosis not present

## 2017-10-31 DIAGNOSIS — D519 Vitamin B12 deficiency anemia, unspecified: Secondary | ICD-10-CM | POA: Diagnosis not present

## 2017-11-06 DIAGNOSIS — M170 Bilateral primary osteoarthritis of knee: Secondary | ICD-10-CM | POA: Diagnosis not present

## 2017-11-07 ENCOUNTER — Other Ambulatory Visit: Payer: Self-pay

## 2017-11-13 DIAGNOSIS — R269 Unspecified abnormalities of gait and mobility: Secondary | ICD-10-CM | POA: Diagnosis not present

## 2017-11-13 DIAGNOSIS — M170 Bilateral primary osteoarthritis of knee: Secondary | ICD-10-CM | POA: Diagnosis not present

## 2017-11-13 DIAGNOSIS — M25562 Pain in left knee: Secondary | ICD-10-CM | POA: Diagnosis not present

## 2017-11-13 DIAGNOSIS — M25561 Pain in right knee: Secondary | ICD-10-CM | POA: Diagnosis not present

## 2017-11-13 DIAGNOSIS — M6281 Muscle weakness (generalized): Secondary | ICD-10-CM | POA: Diagnosis not present

## 2017-11-15 DIAGNOSIS — R269 Unspecified abnormalities of gait and mobility: Secondary | ICD-10-CM | POA: Diagnosis not present

## 2017-11-15 DIAGNOSIS — M25561 Pain in right knee: Secondary | ICD-10-CM | POA: Diagnosis not present

## 2017-11-15 DIAGNOSIS — M170 Bilateral primary osteoarthritis of knee: Secondary | ICD-10-CM | POA: Diagnosis not present

## 2017-11-15 DIAGNOSIS — M25562 Pain in left knee: Secondary | ICD-10-CM | POA: Diagnosis not present

## 2017-11-15 DIAGNOSIS — M6281 Muscle weakness (generalized): Secondary | ICD-10-CM | POA: Diagnosis not present

## 2017-11-20 DIAGNOSIS — M170 Bilateral primary osteoarthritis of knee: Secondary | ICD-10-CM | POA: Diagnosis not present

## 2017-11-20 DIAGNOSIS — M25562 Pain in left knee: Secondary | ICD-10-CM | POA: Diagnosis not present

## 2017-11-20 DIAGNOSIS — M25561 Pain in right knee: Secondary | ICD-10-CM | POA: Diagnosis not present

## 2017-11-20 DIAGNOSIS — M6281 Muscle weakness (generalized): Secondary | ICD-10-CM | POA: Diagnosis not present

## 2017-11-20 DIAGNOSIS — R269 Unspecified abnormalities of gait and mobility: Secondary | ICD-10-CM | POA: Diagnosis not present

## 2017-11-22 DIAGNOSIS — H353123 Nonexudative age-related macular degeneration, left eye, advanced atrophic without subfoveal involvement: Secondary | ICD-10-CM | POA: Diagnosis not present

## 2017-11-22 DIAGNOSIS — H353211 Exudative age-related macular degeneration, right eye, with active choroidal neovascularization: Secondary | ICD-10-CM | POA: Diagnosis not present

## 2017-11-22 DIAGNOSIS — H43813 Vitreous degeneration, bilateral: Secondary | ICD-10-CM | POA: Diagnosis not present

## 2017-11-22 DIAGNOSIS — H35032 Hypertensive retinopathy, left eye: Secondary | ICD-10-CM | POA: Diagnosis not present

## 2017-11-23 DIAGNOSIS — M25562 Pain in left knee: Secondary | ICD-10-CM | POA: Diagnosis not present

## 2017-11-23 DIAGNOSIS — R269 Unspecified abnormalities of gait and mobility: Secondary | ICD-10-CM | POA: Diagnosis not present

## 2017-11-23 DIAGNOSIS — M170 Bilateral primary osteoarthritis of knee: Secondary | ICD-10-CM | POA: Diagnosis not present

## 2017-11-23 DIAGNOSIS — M25561 Pain in right knee: Secondary | ICD-10-CM | POA: Diagnosis not present

## 2017-11-23 DIAGNOSIS — M6281 Muscle weakness (generalized): Secondary | ICD-10-CM | POA: Diagnosis not present

## 2017-11-27 DIAGNOSIS — L89892 Pressure ulcer of other site, stage 2: Secondary | ICD-10-CM | POA: Diagnosis not present

## 2017-11-27 DIAGNOSIS — M25561 Pain in right knee: Secondary | ICD-10-CM | POA: Diagnosis not present

## 2017-11-27 DIAGNOSIS — I739 Peripheral vascular disease, unspecified: Secondary | ICD-10-CM | POA: Diagnosis not present

## 2017-11-27 DIAGNOSIS — M25562 Pain in left knee: Secondary | ICD-10-CM | POA: Diagnosis not present

## 2017-11-27 DIAGNOSIS — R269 Unspecified abnormalities of gait and mobility: Secondary | ICD-10-CM | POA: Diagnosis not present

## 2017-11-27 DIAGNOSIS — M170 Bilateral primary osteoarthritis of knee: Secondary | ICD-10-CM | POA: Diagnosis not present

## 2017-11-27 DIAGNOSIS — M6281 Muscle weakness (generalized): Secondary | ICD-10-CM | POA: Diagnosis not present

## 2017-11-29 DIAGNOSIS — D519 Vitamin B12 deficiency anemia, unspecified: Secondary | ICD-10-CM | POA: Diagnosis not present

## 2017-11-30 DIAGNOSIS — M25561 Pain in right knee: Secondary | ICD-10-CM | POA: Diagnosis not present

## 2017-11-30 DIAGNOSIS — M25562 Pain in left knee: Secondary | ICD-10-CM | POA: Diagnosis not present

## 2017-11-30 DIAGNOSIS — M6281 Muscle weakness (generalized): Secondary | ICD-10-CM | POA: Diagnosis not present

## 2017-11-30 DIAGNOSIS — R269 Unspecified abnormalities of gait and mobility: Secondary | ICD-10-CM | POA: Diagnosis not present

## 2017-11-30 DIAGNOSIS — M170 Bilateral primary osteoarthritis of knee: Secondary | ICD-10-CM | POA: Diagnosis not present

## 2017-12-04 DIAGNOSIS — R269 Unspecified abnormalities of gait and mobility: Secondary | ICD-10-CM | POA: Diagnosis not present

## 2017-12-04 DIAGNOSIS — M6281 Muscle weakness (generalized): Secondary | ICD-10-CM | POA: Diagnosis not present

## 2017-12-04 DIAGNOSIS — M25561 Pain in right knee: Secondary | ICD-10-CM | POA: Diagnosis not present

## 2017-12-04 DIAGNOSIS — M170 Bilateral primary osteoarthritis of knee: Secondary | ICD-10-CM | POA: Diagnosis not present

## 2017-12-04 DIAGNOSIS — M25562 Pain in left knee: Secondary | ICD-10-CM | POA: Diagnosis not present

## 2017-12-11 DIAGNOSIS — M25562 Pain in left knee: Secondary | ICD-10-CM | POA: Diagnosis not present

## 2017-12-11 DIAGNOSIS — M25561 Pain in right knee: Secondary | ICD-10-CM | POA: Diagnosis not present

## 2017-12-11 DIAGNOSIS — M170 Bilateral primary osteoarthritis of knee: Secondary | ICD-10-CM | POA: Diagnosis not present

## 2017-12-11 DIAGNOSIS — M6281 Muscle weakness (generalized): Secondary | ICD-10-CM | POA: Diagnosis not present

## 2017-12-11 DIAGNOSIS — R269 Unspecified abnormalities of gait and mobility: Secondary | ICD-10-CM | POA: Diagnosis not present

## 2017-12-14 DIAGNOSIS — M170 Bilateral primary osteoarthritis of knee: Secondary | ICD-10-CM | POA: Diagnosis not present

## 2017-12-14 DIAGNOSIS — M6281 Muscle weakness (generalized): Secondary | ICD-10-CM | POA: Diagnosis not present

## 2017-12-14 DIAGNOSIS — R269 Unspecified abnormalities of gait and mobility: Secondary | ICD-10-CM | POA: Diagnosis not present

## 2017-12-14 DIAGNOSIS — M25562 Pain in left knee: Secondary | ICD-10-CM | POA: Diagnosis not present

## 2017-12-14 DIAGNOSIS — M25561 Pain in right knee: Secondary | ICD-10-CM | POA: Diagnosis not present

## 2017-12-17 DIAGNOSIS — M25561 Pain in right knee: Secondary | ICD-10-CM | POA: Diagnosis not present

## 2017-12-17 DIAGNOSIS — M170 Bilateral primary osteoarthritis of knee: Secondary | ICD-10-CM | POA: Diagnosis not present

## 2017-12-17 DIAGNOSIS — M25562 Pain in left knee: Secondary | ICD-10-CM | POA: Diagnosis not present

## 2017-12-17 DIAGNOSIS — R269 Unspecified abnormalities of gait and mobility: Secondary | ICD-10-CM | POA: Diagnosis not present

## 2017-12-17 DIAGNOSIS — M6281 Muscle weakness (generalized): Secondary | ICD-10-CM | POA: Diagnosis not present

## 2017-12-19 DIAGNOSIS — M6281 Muscle weakness (generalized): Secondary | ICD-10-CM | POA: Diagnosis not present

## 2017-12-19 DIAGNOSIS — M25562 Pain in left knee: Secondary | ICD-10-CM | POA: Diagnosis not present

## 2017-12-19 DIAGNOSIS — M170 Bilateral primary osteoarthritis of knee: Secondary | ICD-10-CM | POA: Diagnosis not present

## 2017-12-19 DIAGNOSIS — M25561 Pain in right knee: Secondary | ICD-10-CM | POA: Diagnosis not present

## 2017-12-19 DIAGNOSIS — R269 Unspecified abnormalities of gait and mobility: Secondary | ICD-10-CM | POA: Diagnosis not present

## 2017-12-24 DIAGNOSIS — M170 Bilateral primary osteoarthritis of knee: Secondary | ICD-10-CM | POA: Diagnosis not present

## 2017-12-24 DIAGNOSIS — M25561 Pain in right knee: Secondary | ICD-10-CM | POA: Diagnosis not present

## 2017-12-24 DIAGNOSIS — M6281 Muscle weakness (generalized): Secondary | ICD-10-CM | POA: Diagnosis not present

## 2017-12-24 DIAGNOSIS — M25562 Pain in left knee: Secondary | ICD-10-CM | POA: Diagnosis not present

## 2017-12-24 DIAGNOSIS — R269 Unspecified abnormalities of gait and mobility: Secondary | ICD-10-CM | POA: Diagnosis not present

## 2017-12-25 DIAGNOSIS — M79672 Pain in left foot: Secondary | ICD-10-CM | POA: Diagnosis not present

## 2017-12-25 DIAGNOSIS — M25579 Pain in unspecified ankle and joints of unspecified foot: Secondary | ICD-10-CM | POA: Diagnosis not present

## 2017-12-26 DIAGNOSIS — R269 Unspecified abnormalities of gait and mobility: Secondary | ICD-10-CM | POA: Diagnosis not present

## 2017-12-26 DIAGNOSIS — M6281 Muscle weakness (generalized): Secondary | ICD-10-CM | POA: Diagnosis not present

## 2017-12-26 DIAGNOSIS — M170 Bilateral primary osteoarthritis of knee: Secondary | ICD-10-CM | POA: Diagnosis not present

## 2017-12-26 DIAGNOSIS — M25561 Pain in right knee: Secondary | ICD-10-CM | POA: Diagnosis not present

## 2017-12-26 DIAGNOSIS — M25562 Pain in left knee: Secondary | ICD-10-CM | POA: Diagnosis not present

## 2017-12-27 DIAGNOSIS — D519 Vitamin B12 deficiency anemia, unspecified: Secondary | ICD-10-CM | POA: Diagnosis not present

## 2017-12-31 DIAGNOSIS — M170 Bilateral primary osteoarthritis of knee: Secondary | ICD-10-CM | POA: Diagnosis not present

## 2017-12-31 DIAGNOSIS — M6281 Muscle weakness (generalized): Secondary | ICD-10-CM | POA: Diagnosis not present

## 2017-12-31 DIAGNOSIS — R269 Unspecified abnormalities of gait and mobility: Secondary | ICD-10-CM | POA: Diagnosis not present

## 2017-12-31 DIAGNOSIS — M25562 Pain in left knee: Secondary | ICD-10-CM | POA: Diagnosis not present

## 2017-12-31 DIAGNOSIS — M25561 Pain in right knee: Secondary | ICD-10-CM | POA: Diagnosis not present

## 2018-01-02 DIAGNOSIS — M170 Bilateral primary osteoarthritis of knee: Secondary | ICD-10-CM | POA: Diagnosis not present

## 2018-01-02 DIAGNOSIS — R269 Unspecified abnormalities of gait and mobility: Secondary | ICD-10-CM | POA: Diagnosis not present

## 2018-01-02 DIAGNOSIS — M6281 Muscle weakness (generalized): Secondary | ICD-10-CM | POA: Diagnosis not present

## 2018-01-02 DIAGNOSIS — M25562 Pain in left knee: Secondary | ICD-10-CM | POA: Diagnosis not present

## 2018-01-02 DIAGNOSIS — M25561 Pain in right knee: Secondary | ICD-10-CM | POA: Diagnosis not present

## 2018-01-07 DIAGNOSIS — H353211 Exudative age-related macular degeneration, right eye, with active choroidal neovascularization: Secondary | ICD-10-CM | POA: Diagnosis not present

## 2018-01-07 DIAGNOSIS — H353123 Nonexudative age-related macular degeneration, left eye, advanced atrophic without subfoveal involvement: Secondary | ICD-10-CM | POA: Diagnosis not present

## 2018-01-08 DIAGNOSIS — R269 Unspecified abnormalities of gait and mobility: Secondary | ICD-10-CM | POA: Diagnosis not present

## 2018-01-08 DIAGNOSIS — M6281 Muscle weakness (generalized): Secondary | ICD-10-CM | POA: Diagnosis not present

## 2018-01-08 DIAGNOSIS — M25561 Pain in right knee: Secondary | ICD-10-CM | POA: Diagnosis not present

## 2018-01-08 DIAGNOSIS — M25562 Pain in left knee: Secondary | ICD-10-CM | POA: Diagnosis not present

## 2018-01-08 DIAGNOSIS — M170 Bilateral primary osteoarthritis of knee: Secondary | ICD-10-CM | POA: Diagnosis not present

## 2018-01-10 DIAGNOSIS — Z23 Encounter for immunization: Secondary | ICD-10-CM | POA: Diagnosis not present

## 2018-01-10 DIAGNOSIS — M25562 Pain in left knee: Secondary | ICD-10-CM | POA: Diagnosis not present

## 2018-01-10 DIAGNOSIS — R269 Unspecified abnormalities of gait and mobility: Secondary | ICD-10-CM | POA: Diagnosis not present

## 2018-01-10 DIAGNOSIS — M6281 Muscle weakness (generalized): Secondary | ICD-10-CM | POA: Diagnosis not present

## 2018-01-10 DIAGNOSIS — M170 Bilateral primary osteoarthritis of knee: Secondary | ICD-10-CM | POA: Diagnosis not present

## 2018-01-10 DIAGNOSIS — M25561 Pain in right knee: Secondary | ICD-10-CM | POA: Diagnosis not present

## 2018-01-22 DIAGNOSIS — I739 Peripheral vascular disease, unspecified: Secondary | ICD-10-CM | POA: Diagnosis not present

## 2018-01-24 DIAGNOSIS — D519 Vitamin B12 deficiency anemia, unspecified: Secondary | ICD-10-CM | POA: Diagnosis not present

## 2018-02-06 DIAGNOSIS — D519 Vitamin B12 deficiency anemia, unspecified: Secondary | ICD-10-CM | POA: Diagnosis not present

## 2018-02-06 DIAGNOSIS — I82509 Chronic embolism and thrombosis of unspecified deep veins of unspecified lower extremity: Secondary | ICD-10-CM | POA: Diagnosis not present

## 2018-02-06 DIAGNOSIS — E782 Mixed hyperlipidemia: Secondary | ICD-10-CM | POA: Diagnosis not present

## 2018-02-13 DIAGNOSIS — N6310 Unspecified lump in the right breast, unspecified quadrant: Secondary | ICD-10-CM | POA: Diagnosis not present

## 2018-02-13 DIAGNOSIS — D519 Vitamin B12 deficiency anemia, unspecified: Secondary | ICD-10-CM | POA: Diagnosis not present

## 2018-02-13 DIAGNOSIS — F329 Major depressive disorder, single episode, unspecified: Secondary | ICD-10-CM | POA: Diagnosis not present

## 2018-02-13 DIAGNOSIS — I5031 Acute diastolic (congestive) heart failure: Secondary | ICD-10-CM | POA: Diagnosis not present

## 2018-02-13 DIAGNOSIS — F419 Anxiety disorder, unspecified: Secondary | ICD-10-CM | POA: Diagnosis not present

## 2018-02-13 DIAGNOSIS — Z6827 Body mass index (BMI) 27.0-27.9, adult: Secondary | ICD-10-CM | POA: Diagnosis not present

## 2018-02-13 DIAGNOSIS — Z86718 Personal history of other venous thrombosis and embolism: Secondary | ICD-10-CM | POA: Diagnosis not present

## 2018-02-13 DIAGNOSIS — E782 Mixed hyperlipidemia: Secondary | ICD-10-CM | POA: Diagnosis not present

## 2018-02-20 DIAGNOSIS — N6311 Unspecified lump in the right breast, upper outer quadrant: Secondary | ICD-10-CM | POA: Diagnosis not present

## 2018-02-20 DIAGNOSIS — D519 Vitamin B12 deficiency anemia, unspecified: Secondary | ICD-10-CM | POA: Diagnosis not present

## 2018-02-20 DIAGNOSIS — R922 Inconclusive mammogram: Secondary | ICD-10-CM | POA: Diagnosis not present

## 2018-02-21 DIAGNOSIS — H35423 Microcystoid degeneration of retina, bilateral: Secondary | ICD-10-CM | POA: Diagnosis not present

## 2018-02-21 DIAGNOSIS — H35032 Hypertensive retinopathy, left eye: Secondary | ICD-10-CM | POA: Diagnosis not present

## 2018-02-21 DIAGNOSIS — H353123 Nonexudative age-related macular degeneration, left eye, advanced atrophic without subfoveal involvement: Secondary | ICD-10-CM | POA: Diagnosis not present

## 2018-02-21 DIAGNOSIS — H353211 Exudative age-related macular degeneration, right eye, with active choroidal neovascularization: Secondary | ICD-10-CM | POA: Diagnosis not present

## 2018-02-22 ENCOUNTER — Other Ambulatory Visit: Payer: Self-pay

## 2018-03-15 DIAGNOSIS — I872 Venous insufficiency (chronic) (peripheral): Secondary | ICD-10-CM | POA: Diagnosis not present

## 2018-03-15 DIAGNOSIS — Z6826 Body mass index (BMI) 26.0-26.9, adult: Secondary | ICD-10-CM | POA: Diagnosis not present

## 2018-03-20 DIAGNOSIS — Z6826 Body mass index (BMI) 26.0-26.9, adult: Secondary | ICD-10-CM | POA: Diagnosis not present

## 2018-03-20 DIAGNOSIS — R609 Edema, unspecified: Secondary | ICD-10-CM | POA: Diagnosis not present

## 2018-03-20 DIAGNOSIS — D519 Vitamin B12 deficiency anemia, unspecified: Secondary | ICD-10-CM | POA: Diagnosis not present

## 2018-04-11 DIAGNOSIS — H43813 Vitreous degeneration, bilateral: Secondary | ICD-10-CM | POA: Diagnosis not present

## 2018-04-11 DIAGNOSIS — H353211 Exudative age-related macular degeneration, right eye, with active choroidal neovascularization: Secondary | ICD-10-CM | POA: Diagnosis not present

## 2018-04-11 DIAGNOSIS — H35423 Microcystoid degeneration of retina, bilateral: Secondary | ICD-10-CM | POA: Diagnosis not present

## 2018-04-11 DIAGNOSIS — H353123 Nonexudative age-related macular degeneration, left eye, advanced atrophic without subfoveal involvement: Secondary | ICD-10-CM | POA: Diagnosis not present

## 2018-04-12 DIAGNOSIS — H16142 Punctate keratitis, left eye: Secondary | ICD-10-CM | POA: Diagnosis not present

## 2018-04-12 DIAGNOSIS — Z961 Presence of intraocular lens: Secondary | ICD-10-CM | POA: Diagnosis not present

## 2018-04-12 DIAGNOSIS — H401131 Primary open-angle glaucoma, bilateral, mild stage: Secondary | ICD-10-CM | POA: Diagnosis not present

## 2018-04-12 DIAGNOSIS — H16223 Keratoconjunctivitis sicca, not specified as Sjogren's, bilateral: Secondary | ICD-10-CM | POA: Diagnosis not present

## 2018-04-18 DIAGNOSIS — I739 Peripheral vascular disease, unspecified: Secondary | ICD-10-CM | POA: Diagnosis not present

## 2018-04-22 DIAGNOSIS — D519 Vitamin B12 deficiency anemia, unspecified: Secondary | ICD-10-CM | POA: Diagnosis not present

## 2018-04-29 DIAGNOSIS — S93332D Other subluxation of left foot, subsequent encounter: Secondary | ICD-10-CM | POA: Diagnosis not present

## 2018-04-29 DIAGNOSIS — M79675 Pain in left toe(s): Secondary | ICD-10-CM | POA: Diagnosis not present

## 2018-04-29 DIAGNOSIS — M79672 Pain in left foot: Secondary | ICD-10-CM | POA: Diagnosis not present

## 2018-05-20 DIAGNOSIS — M79672 Pain in left foot: Secondary | ICD-10-CM | POA: Diagnosis not present

## 2018-05-20 DIAGNOSIS — M779 Enthesopathy, unspecified: Secondary | ICD-10-CM | POA: Diagnosis not present

## 2018-05-24 DIAGNOSIS — D519 Vitamin B12 deficiency anemia, unspecified: Secondary | ICD-10-CM | POA: Diagnosis not present

## 2018-05-30 DIAGNOSIS — H35423 Microcystoid degeneration of retina, bilateral: Secondary | ICD-10-CM | POA: Diagnosis not present

## 2018-05-30 DIAGNOSIS — H35032 Hypertensive retinopathy, left eye: Secondary | ICD-10-CM | POA: Diagnosis not present

## 2018-05-30 DIAGNOSIS — H353211 Exudative age-related macular degeneration, right eye, with active choroidal neovascularization: Secondary | ICD-10-CM | POA: Diagnosis not present

## 2018-05-30 DIAGNOSIS — H353123 Nonexudative age-related macular degeneration, left eye, advanced atrophic without subfoveal involvement: Secondary | ICD-10-CM | POA: Diagnosis not present

## 2018-06-18 DIAGNOSIS — M79672 Pain in left foot: Secondary | ICD-10-CM | POA: Diagnosis not present

## 2018-06-18 DIAGNOSIS — G5750 Tarsal tunnel syndrome, unspecified lower limb: Secondary | ICD-10-CM | POA: Diagnosis not present

## 2018-06-20 DIAGNOSIS — H353123 Nonexudative age-related macular degeneration, left eye, advanced atrophic without subfoveal involvement: Secondary | ICD-10-CM | POA: Diagnosis not present

## 2018-06-21 DIAGNOSIS — D519 Vitamin B12 deficiency anemia, unspecified: Secondary | ICD-10-CM | POA: Diagnosis not present

## 2018-07-11 DIAGNOSIS — I739 Peripheral vascular disease, unspecified: Secondary | ICD-10-CM | POA: Diagnosis not present

## 2018-07-18 DIAGNOSIS — H353211 Exudative age-related macular degeneration, right eye, with active choroidal neovascularization: Secondary | ICD-10-CM | POA: Diagnosis not present

## 2018-07-23 DIAGNOSIS — D519 Vitamin B12 deficiency anemia, unspecified: Secondary | ICD-10-CM | POA: Diagnosis not present

## 2018-08-22 DIAGNOSIS — D519 Vitamin B12 deficiency anemia, unspecified: Secondary | ICD-10-CM | POA: Diagnosis not present

## 2018-08-29 DIAGNOSIS — M79672 Pain in left foot: Secondary | ICD-10-CM | POA: Diagnosis not present

## 2018-08-29 DIAGNOSIS — M778 Other enthesopathies, not elsewhere classified: Secondary | ICD-10-CM | POA: Diagnosis not present

## 2018-09-09 DIAGNOSIS — H353231 Exudative age-related macular degeneration, bilateral, with active choroidal neovascularization: Secondary | ICD-10-CM | POA: Diagnosis not present

## 2018-09-17 DIAGNOSIS — Z961 Presence of intraocular lens: Secondary | ICD-10-CM | POA: Diagnosis not present

## 2018-09-17 DIAGNOSIS — H353231 Exudative age-related macular degeneration, bilateral, with active choroidal neovascularization: Secondary | ICD-10-CM | POA: Diagnosis not present

## 2018-09-17 DIAGNOSIS — H401131 Primary open-angle glaucoma, bilateral, mild stage: Secondary | ICD-10-CM | POA: Diagnosis not present

## 2018-09-23 DIAGNOSIS — D519 Vitamin B12 deficiency anemia, unspecified: Secondary | ICD-10-CM | POA: Diagnosis not present

## 2018-09-26 DIAGNOSIS — I739 Peripheral vascular disease, unspecified: Secondary | ICD-10-CM | POA: Diagnosis not present

## 2018-10-07 DIAGNOSIS — H353221 Exudative age-related macular degeneration, left eye, with active choroidal neovascularization: Secondary | ICD-10-CM | POA: Diagnosis not present

## 2018-10-21 DIAGNOSIS — D519 Vitamin B12 deficiency anemia, unspecified: Secondary | ICD-10-CM | POA: Diagnosis not present

## 2018-11-04 DIAGNOSIS — H353231 Exudative age-related macular degeneration, bilateral, with active choroidal neovascularization: Secondary | ICD-10-CM | POA: Diagnosis not present

## 2018-11-04 DIAGNOSIS — H43813 Vitreous degeneration, bilateral: Secondary | ICD-10-CM | POA: Diagnosis not present

## 2018-11-04 DIAGNOSIS — H35423 Microcystoid degeneration of retina, bilateral: Secondary | ICD-10-CM | POA: Diagnosis not present

## 2018-11-04 DIAGNOSIS — H35033 Hypertensive retinopathy, bilateral: Secondary | ICD-10-CM | POA: Diagnosis not present

## 2018-11-19 DIAGNOSIS — G5750 Tarsal tunnel syndrome, unspecified lower limb: Secondary | ICD-10-CM | POA: Diagnosis not present

## 2018-11-19 DIAGNOSIS — M79672 Pain in left foot: Secondary | ICD-10-CM | POA: Diagnosis not present

## 2018-11-25 DIAGNOSIS — D519 Vitamin B12 deficiency anemia, unspecified: Secondary | ICD-10-CM | POA: Diagnosis not present

## 2018-12-04 DIAGNOSIS — H353221 Exudative age-related macular degeneration, left eye, with active choroidal neovascularization: Secondary | ICD-10-CM | POA: Diagnosis not present

## 2018-12-12 DIAGNOSIS — I739 Peripheral vascular disease, unspecified: Secondary | ICD-10-CM | POA: Diagnosis not present

## 2018-12-25 DIAGNOSIS — Z23 Encounter for immunization: Secondary | ICD-10-CM | POA: Diagnosis not present

## 2018-12-27 DIAGNOSIS — D519 Vitamin B12 deficiency anemia, unspecified: Secondary | ICD-10-CM | POA: Diagnosis not present

## 2018-12-30 DIAGNOSIS — H353231 Exudative age-related macular degeneration, bilateral, with active choroidal neovascularization: Secondary | ICD-10-CM | POA: Diagnosis not present

## 2018-12-30 DIAGNOSIS — H43813 Vitreous degeneration, bilateral: Secondary | ICD-10-CM | POA: Diagnosis not present

## 2018-12-30 DIAGNOSIS — H35031 Hypertensive retinopathy, right eye: Secondary | ICD-10-CM | POA: Diagnosis not present

## 2018-12-30 DIAGNOSIS — H35423 Microcystoid degeneration of retina, bilateral: Secondary | ICD-10-CM | POA: Diagnosis not present

## 2019-01-27 DIAGNOSIS — D519 Vitamin B12 deficiency anemia, unspecified: Secondary | ICD-10-CM | POA: Diagnosis not present

## 2019-01-31 DIAGNOSIS — D519 Vitamin B12 deficiency anemia, unspecified: Secondary | ICD-10-CM | POA: Diagnosis not present

## 2019-01-31 DIAGNOSIS — I82402 Acute embolism and thrombosis of unspecified deep veins of left lower extremity: Secondary | ICD-10-CM | POA: Diagnosis not present

## 2019-01-31 DIAGNOSIS — I82509 Chronic embolism and thrombosis of unspecified deep veins of unspecified lower extremity: Secondary | ICD-10-CM | POA: Diagnosis not present

## 2019-01-31 DIAGNOSIS — E782 Mixed hyperlipidemia: Secondary | ICD-10-CM | POA: Diagnosis not present

## 2019-02-07 DIAGNOSIS — M170 Bilateral primary osteoarthritis of knee: Secondary | ICD-10-CM | POA: Diagnosis not present

## 2019-02-07 DIAGNOSIS — L932 Other local lupus erythematosus: Secondary | ICD-10-CM | POA: Diagnosis not present

## 2019-02-07 DIAGNOSIS — F329 Major depressive disorder, single episode, unspecified: Secondary | ICD-10-CM | POA: Diagnosis not present

## 2019-02-07 DIAGNOSIS — Z86718 Personal history of other venous thrombosis and embolism: Secondary | ICD-10-CM | POA: Diagnosis not present

## 2019-02-07 DIAGNOSIS — D519 Vitamin B12 deficiency anemia, unspecified: Secondary | ICD-10-CM | POA: Diagnosis not present

## 2019-02-07 DIAGNOSIS — F419 Anxiety disorder, unspecified: Secondary | ICD-10-CM | POA: Diagnosis not present

## 2019-02-07 DIAGNOSIS — M79641 Pain in right hand: Secondary | ICD-10-CM | POA: Diagnosis not present

## 2019-02-07 DIAGNOSIS — E782 Mixed hyperlipidemia: Secondary | ICD-10-CM | POA: Diagnosis not present

## 2019-02-07 DIAGNOSIS — I5031 Acute diastolic (congestive) heart failure: Secondary | ICD-10-CM | POA: Diagnosis not present

## 2019-02-07 DIAGNOSIS — M79642 Pain in left hand: Secondary | ICD-10-CM | POA: Diagnosis not present

## 2019-02-07 DIAGNOSIS — Z0000 Encounter for general adult medical examination without abnormal findings: Secondary | ICD-10-CM | POA: Diagnosis not present

## 2019-02-07 DIAGNOSIS — Z6826 Body mass index (BMI) 26.0-26.9, adult: Secondary | ICD-10-CM | POA: Diagnosis not present

## 2019-02-20 DIAGNOSIS — I739 Peripheral vascular disease, unspecified: Secondary | ICD-10-CM | POA: Diagnosis not present

## 2019-02-21 DIAGNOSIS — M7989 Other specified soft tissue disorders: Secondary | ICD-10-CM | POA: Diagnosis not present

## 2019-02-21 DIAGNOSIS — M150 Primary generalized (osteo)arthritis: Secondary | ICD-10-CM | POA: Diagnosis not present

## 2019-02-21 DIAGNOSIS — Z6825 Body mass index (BMI) 25.0-25.9, adult: Secondary | ICD-10-CM | POA: Diagnosis not present

## 2019-02-21 DIAGNOSIS — E663 Overweight: Secondary | ICD-10-CM | POA: Diagnosis not present

## 2019-02-21 DIAGNOSIS — M79672 Pain in left foot: Secondary | ICD-10-CM | POA: Diagnosis not present

## 2019-02-21 DIAGNOSIS — R768 Other specified abnormal immunological findings in serum: Secondary | ICD-10-CM | POA: Diagnosis not present

## 2019-02-21 DIAGNOSIS — M79671 Pain in right foot: Secondary | ICD-10-CM | POA: Diagnosis not present

## 2019-02-27 DIAGNOSIS — H353231 Exudative age-related macular degeneration, bilateral, with active choroidal neovascularization: Secondary | ICD-10-CM | POA: Diagnosis not present

## 2019-02-27 DIAGNOSIS — H43813 Vitreous degeneration, bilateral: Secondary | ICD-10-CM | POA: Diagnosis not present

## 2019-02-27 DIAGNOSIS — H35423 Microcystoid degeneration of retina, bilateral: Secondary | ICD-10-CM | POA: Diagnosis not present

## 2019-02-27 DIAGNOSIS — H35031 Hypertensive retinopathy, right eye: Secondary | ICD-10-CM | POA: Diagnosis not present

## 2019-02-27 IMAGING — US US EXTREM LOW VENOUS*L*
1 series · 13 of 24 positions shown · non-contrast
Comparison: None.

CLINICAL DATA: Left lower extremity pain.



[Series 1: us extrem low venous*left* · 0.08mm/px · 13 of 43 slices shown]
[im 1/43]
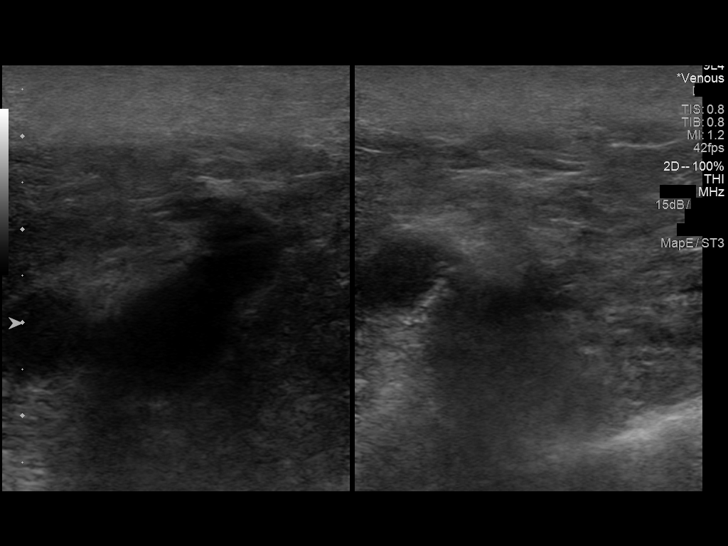
[im 4/43]
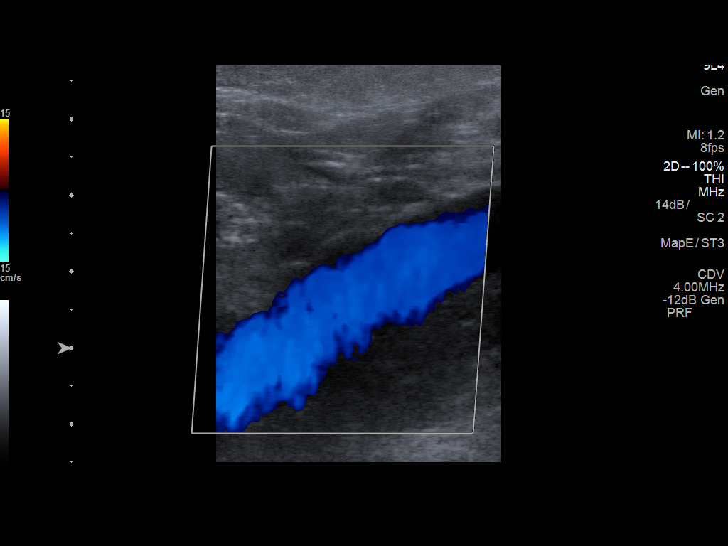
[im 8/43]
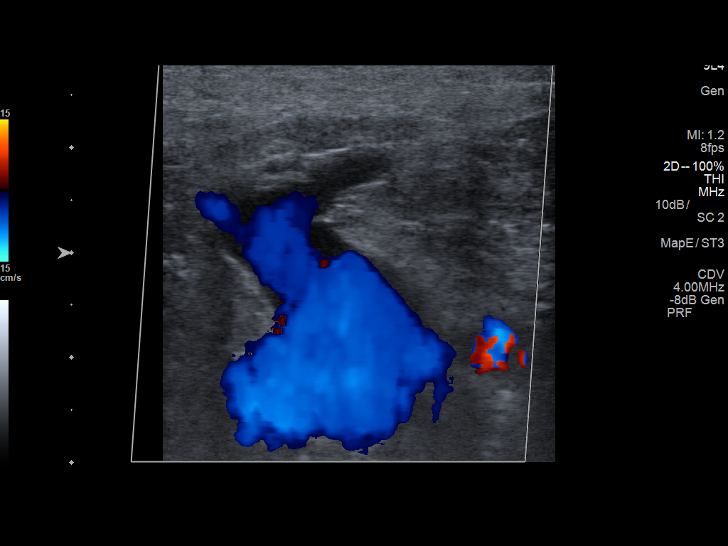
[im 11/43]
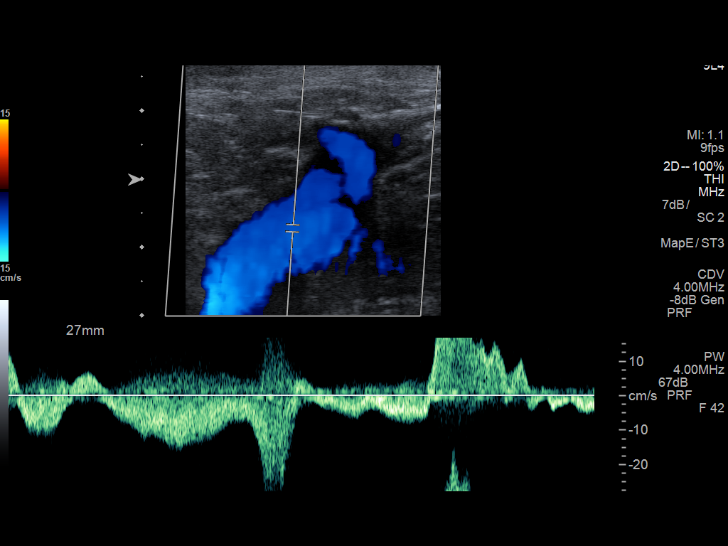
[im 15/43]
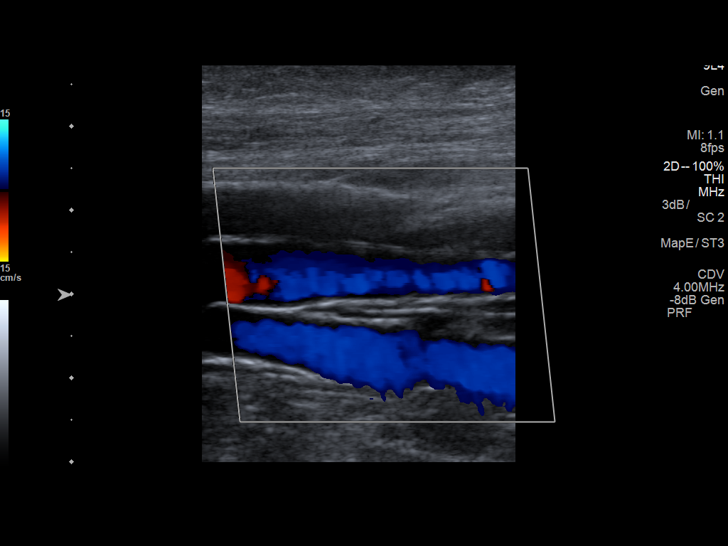
[im 19/43]
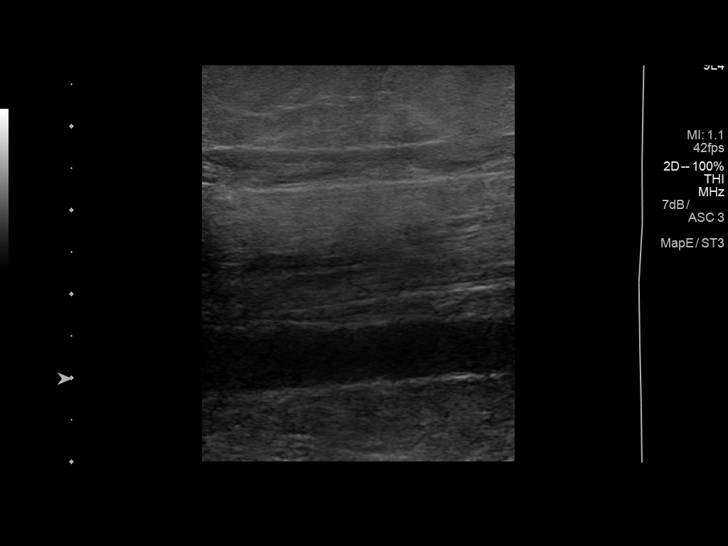
[im 22/43]
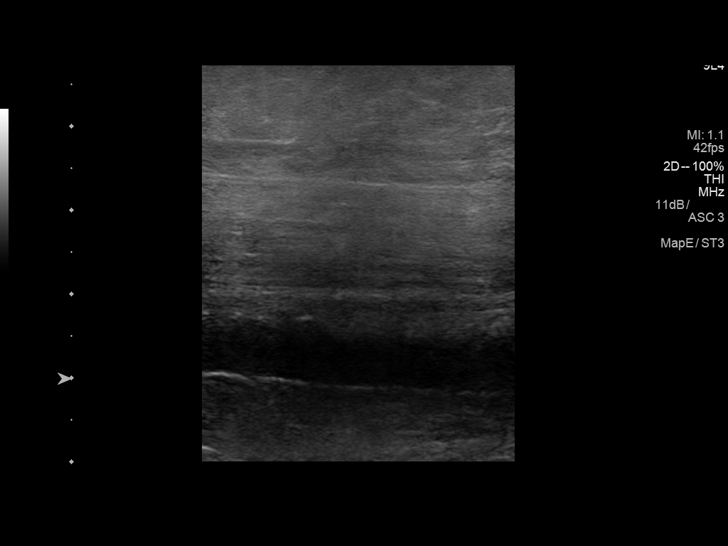
[im 24/43]
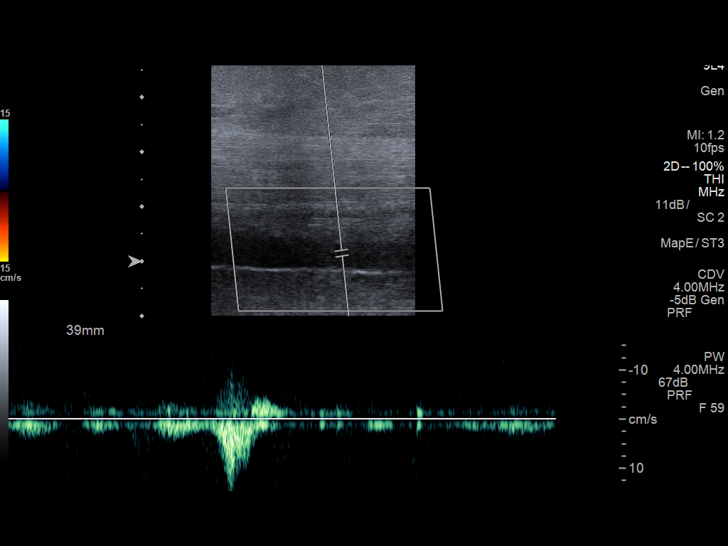
[im 28/43]
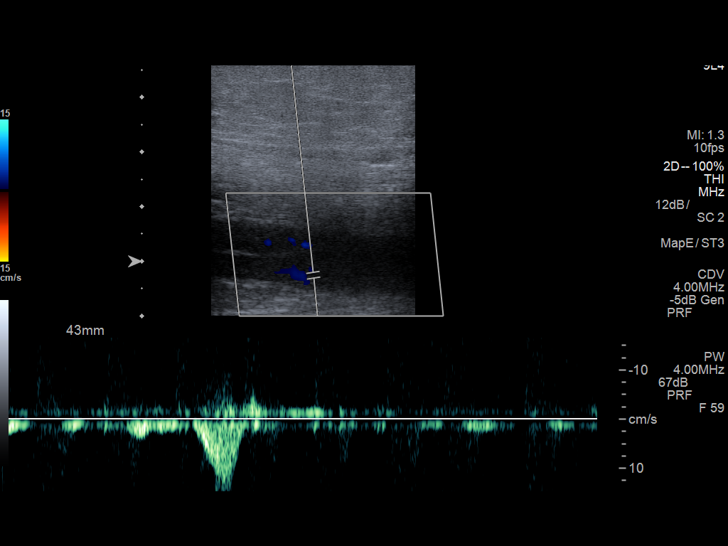
[im 32/43]
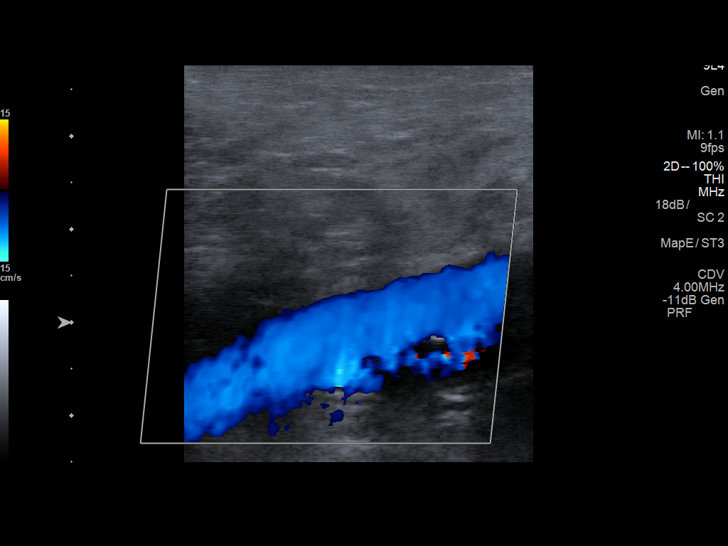
[im 35/43]
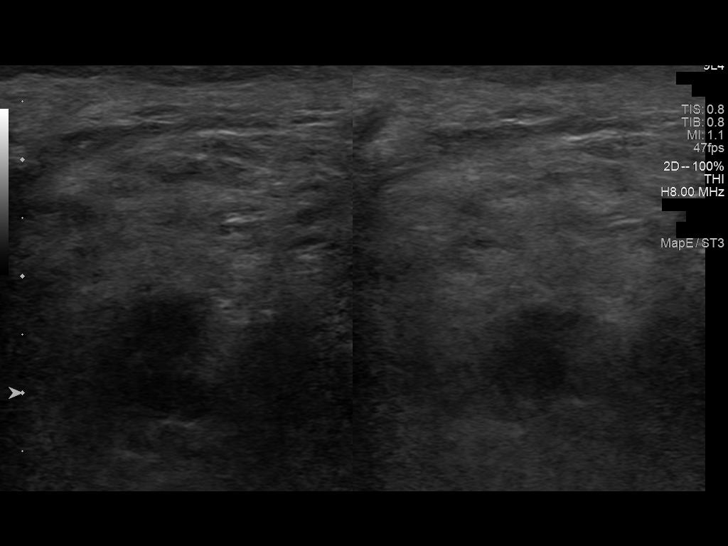
[im 39/43]
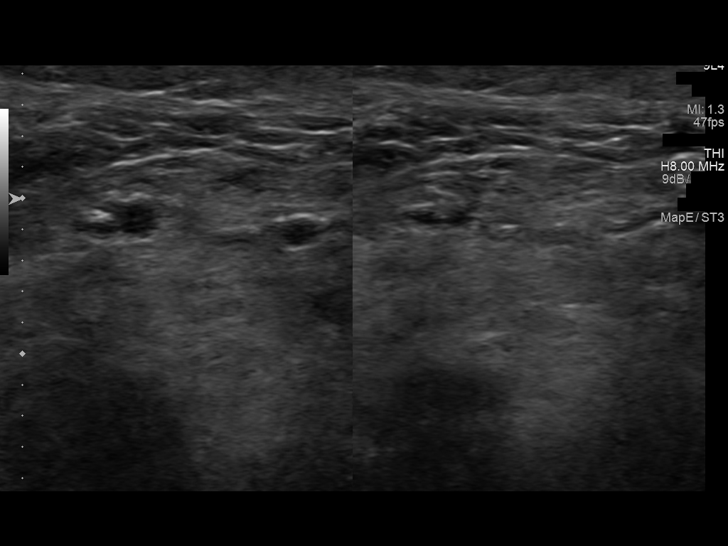
[im 43/43]
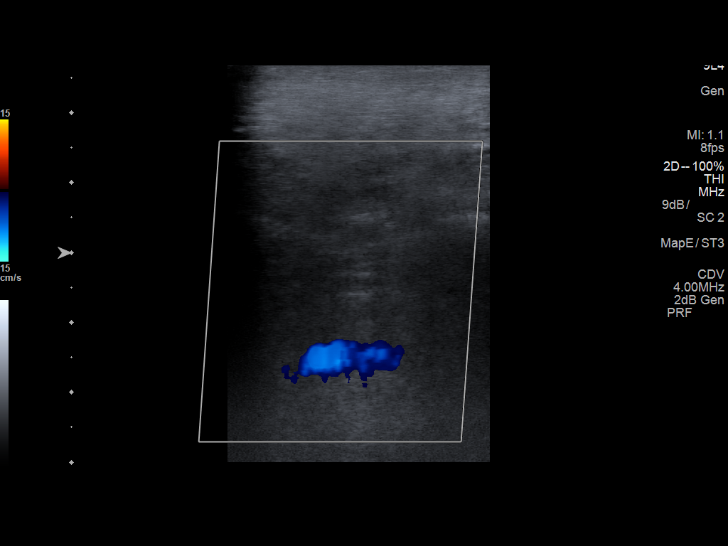

[13 of 24 positions shown; findings below may reference images not displayed]

FINDINGS: Contralateral Common Femoral Vein: Respiratory phasicity is normal
and symmetric with the symptomatic side. No evidence of thrombus.
Normal compressibility.

Common Femoral Vein: No evidence of thrombus. Normal
compressibility, respiratory phasicity and response to augmentation.

Saphenofemoral Junction: No evidence of thrombus. Normal
compressibility and flow on color Doppler imaging.

Profunda Femoral Vein: No evidence of thrombus. Normal
compressibility and flow on color Doppler imaging.

Femoral Vein: No evidence of thrombus. Normal compressibility,
respiratory phasicity and response to augmentation.

Popliteal Vein: No evidence of thrombus. Normal compressibility,
respiratory phasicity and response to augmentation.

Calf Veins: No evidence of thrombus. Normal compressibility and flow
on color Doppler imaging.

Superficial Great Saphenous Vein: No evidence of thrombus. Normal
compressibility.

Venous Reflux:  None.

Other Findings:  None.
IMPRESSION: No evidence of deep venous thrombosis seen in left lower extremity.

## 2019-03-03 DIAGNOSIS — D519 Vitamin B12 deficiency anemia, unspecified: Secondary | ICD-10-CM | POA: Diagnosis not present

## 2019-03-05 ENCOUNTER — Other Ambulatory Visit: Payer: Self-pay

## 2019-03-10 DIAGNOSIS — I10 Essential (primary) hypertension: Secondary | ICD-10-CM | POA: Diagnosis not present

## 2019-03-10 DIAGNOSIS — E782 Mixed hyperlipidemia: Secondary | ICD-10-CM | POA: Diagnosis not present

## 2019-03-17 DIAGNOSIS — M0600 Rheumatoid arthritis without rheumatoid factor, unspecified site: Secondary | ICD-10-CM | POA: Diagnosis not present

## 2019-03-17 DIAGNOSIS — R768 Other specified abnormal immunological findings in serum: Secondary | ICD-10-CM | POA: Diagnosis not present

## 2019-03-17 DIAGNOSIS — E663 Overweight: Secondary | ICD-10-CM | POA: Diagnosis not present

## 2019-03-17 DIAGNOSIS — M79671 Pain in right foot: Secondary | ICD-10-CM | POA: Diagnosis not present

## 2019-03-17 DIAGNOSIS — M150 Primary generalized (osteo)arthritis: Secondary | ICD-10-CM | POA: Diagnosis not present

## 2019-03-17 DIAGNOSIS — M79672 Pain in left foot: Secondary | ICD-10-CM | POA: Diagnosis not present

## 2019-03-17 DIAGNOSIS — M7989 Other specified soft tissue disorders: Secondary | ICD-10-CM | POA: Diagnosis not present

## 2019-03-17 DIAGNOSIS — Z6825 Body mass index (BMI) 25.0-25.9, adult: Secondary | ICD-10-CM | POA: Diagnosis not present

## 2019-04-14 DIAGNOSIS — M778 Other enthesopathies, not elsewhere classified: Secondary | ICD-10-CM | POA: Diagnosis not present

## 2019-04-14 DIAGNOSIS — M79675 Pain in left toe(s): Secondary | ICD-10-CM | POA: Diagnosis not present

## 2019-04-14 DIAGNOSIS — L110 Acquired keratosis follicularis: Secondary | ICD-10-CM | POA: Diagnosis not present

## 2019-04-30 DIAGNOSIS — M0600 Rheumatoid arthritis without rheumatoid factor, unspecified site: Secondary | ICD-10-CM | POA: Diagnosis not present

## 2019-04-30 DIAGNOSIS — R768 Other specified abnormal immunological findings in serum: Secondary | ICD-10-CM | POA: Diagnosis not present

## 2019-05-05 DIAGNOSIS — D519 Vitamin B12 deficiency anemia, unspecified: Secondary | ICD-10-CM | POA: Diagnosis not present

## 2019-05-08 DIAGNOSIS — H353231 Exudative age-related macular degeneration, bilateral, with active choroidal neovascularization: Secondary | ICD-10-CM | POA: Diagnosis not present

## 2019-05-09 DIAGNOSIS — F419 Anxiety disorder, unspecified: Secondary | ICD-10-CM | POA: Diagnosis not present

## 2019-05-09 DIAGNOSIS — E7849 Other hyperlipidemia: Secondary | ICD-10-CM | POA: Diagnosis not present

## 2019-05-15 DIAGNOSIS — Z23 Encounter for immunization: Secondary | ICD-10-CM | POA: Diagnosis not present

## 2019-05-22 DIAGNOSIS — I739 Peripheral vascular disease, unspecified: Secondary | ICD-10-CM | POA: Diagnosis not present

## 2019-06-02 DIAGNOSIS — D519 Vitamin B12 deficiency anemia, unspecified: Secondary | ICD-10-CM | POA: Diagnosis not present

## 2019-06-13 DIAGNOSIS — Z23 Encounter for immunization: Secondary | ICD-10-CM | POA: Diagnosis not present

## 2019-06-17 DIAGNOSIS — H353231 Exudative age-related macular degeneration, bilateral, with active choroidal neovascularization: Secondary | ICD-10-CM | POA: Diagnosis not present

## 2019-06-17 DIAGNOSIS — M7989 Other specified soft tissue disorders: Secondary | ICD-10-CM | POA: Diagnosis not present

## 2019-06-17 DIAGNOSIS — H401131 Primary open-angle glaucoma, bilateral, mild stage: Secondary | ICD-10-CM | POA: Diagnosis not present

## 2019-06-17 DIAGNOSIS — M79672 Pain in left foot: Secondary | ICD-10-CM | POA: Diagnosis not present

## 2019-06-17 DIAGNOSIS — M150 Primary generalized (osteo)arthritis: Secondary | ICD-10-CM | POA: Diagnosis not present

## 2019-06-17 DIAGNOSIS — E663 Overweight: Secondary | ICD-10-CM | POA: Diagnosis not present

## 2019-06-17 DIAGNOSIS — R768 Other specified abnormal immunological findings in serum: Secondary | ICD-10-CM | POA: Diagnosis not present

## 2019-06-17 DIAGNOSIS — M79671 Pain in right foot: Secondary | ICD-10-CM | POA: Diagnosis not present

## 2019-06-17 DIAGNOSIS — Z961 Presence of intraocular lens: Secondary | ICD-10-CM | POA: Diagnosis not present

## 2019-06-17 DIAGNOSIS — H16223 Keratoconjunctivitis sicca, not specified as Sjogren's, bilateral: Secondary | ICD-10-CM | POA: Diagnosis not present

## 2019-06-17 DIAGNOSIS — Z6825 Body mass index (BMI) 25.0-25.9, adult: Secondary | ICD-10-CM | POA: Diagnosis not present

## 2019-06-17 DIAGNOSIS — M0600 Rheumatoid arthritis without rheumatoid factor, unspecified site: Secondary | ICD-10-CM | POA: Diagnosis not present

## 2019-06-20 DIAGNOSIS — I5031 Acute diastolic (congestive) heart failure: Secondary | ICD-10-CM | POA: Diagnosis not present

## 2019-06-20 DIAGNOSIS — D519 Vitamin B12 deficiency anemia, unspecified: Secondary | ICD-10-CM | POA: Diagnosis not present

## 2019-06-20 DIAGNOSIS — F329 Major depressive disorder, single episode, unspecified: Secondary | ICD-10-CM | POA: Diagnosis not present

## 2019-06-20 DIAGNOSIS — F419 Anxiety disorder, unspecified: Secondary | ICD-10-CM | POA: Diagnosis not present

## 2019-06-20 DIAGNOSIS — E782 Mixed hyperlipidemia: Secondary | ICD-10-CM | POA: Diagnosis not present

## 2019-06-20 DIAGNOSIS — Z86718 Personal history of other venous thrombosis and embolism: Secondary | ICD-10-CM | POA: Diagnosis not present

## 2019-06-30 DIAGNOSIS — E538 Deficiency of other specified B group vitamins: Secondary | ICD-10-CM | POA: Diagnosis not present

## 2019-07-03 DIAGNOSIS — H353231 Exudative age-related macular degeneration, bilateral, with active choroidal neovascularization: Secondary | ICD-10-CM | POA: Diagnosis not present

## 2019-07-03 DIAGNOSIS — H35033 Hypertensive retinopathy, bilateral: Secondary | ICD-10-CM | POA: Diagnosis not present

## 2019-07-03 DIAGNOSIS — H43813 Vitreous degeneration, bilateral: Secondary | ICD-10-CM | POA: Diagnosis not present

## 2019-07-03 DIAGNOSIS — H35423 Microcystoid degeneration of retina, bilateral: Secondary | ICD-10-CM | POA: Diagnosis not present

## 2019-07-09 DIAGNOSIS — I10 Essential (primary) hypertension: Secondary | ICD-10-CM | POA: Diagnosis not present

## 2019-07-09 DIAGNOSIS — E7849 Other hyperlipidemia: Secondary | ICD-10-CM | POA: Diagnosis not present

## 2019-07-28 DIAGNOSIS — E538 Deficiency of other specified B group vitamins: Secondary | ICD-10-CM | POA: Diagnosis not present

## 2019-08-08 DIAGNOSIS — E7849 Other hyperlipidemia: Secondary | ICD-10-CM | POA: Diagnosis not present

## 2019-08-08 DIAGNOSIS — F329 Major depressive disorder, single episode, unspecified: Secondary | ICD-10-CM | POA: Diagnosis not present

## 2019-08-14 DIAGNOSIS — I739 Peripheral vascular disease, unspecified: Secondary | ICD-10-CM | POA: Diagnosis not present

## 2019-08-15 DIAGNOSIS — F329 Major depressive disorder, single episode, unspecified: Secondary | ICD-10-CM | POA: Diagnosis not present

## 2019-08-15 DIAGNOSIS — Z96642 Presence of left artificial hip joint: Secondary | ICD-10-CM | POA: Diagnosis not present

## 2019-08-15 DIAGNOSIS — E7800 Pure hypercholesterolemia, unspecified: Secondary | ICD-10-CM | POA: Diagnosis not present

## 2019-08-15 DIAGNOSIS — F410 Panic disorder [episodic paroxysmal anxiety] without agoraphobia: Secondary | ICD-10-CM | POA: Diagnosis not present

## 2019-08-15 DIAGNOSIS — Z90710 Acquired absence of both cervix and uterus: Secondary | ICD-10-CM | POA: Diagnosis not present

## 2019-08-15 DIAGNOSIS — S60222A Contusion of left hand, initial encounter: Secondary | ICD-10-CM | POA: Diagnosis not present

## 2019-08-15 DIAGNOSIS — F419 Anxiety disorder, unspecified: Secondary | ICD-10-CM | POA: Diagnosis not present

## 2019-08-15 DIAGNOSIS — S0990XA Unspecified injury of head, initial encounter: Secondary | ICD-10-CM | POA: Diagnosis not present

## 2019-08-15 DIAGNOSIS — Z7901 Long term (current) use of anticoagulants: Secondary | ICD-10-CM | POA: Diagnosis not present

## 2019-08-15 DIAGNOSIS — Z881 Allergy status to other antibiotic agents status: Secondary | ICD-10-CM | POA: Diagnosis not present

## 2019-08-15 DIAGNOSIS — Z79899 Other long term (current) drug therapy: Secondary | ICD-10-CM | POA: Diagnosis not present

## 2019-08-15 DIAGNOSIS — Z23 Encounter for immunization: Secondary | ICD-10-CM | POA: Diagnosis not present

## 2019-08-15 DIAGNOSIS — Z880 Allergy status to penicillin: Secondary | ICD-10-CM | POA: Diagnosis not present

## 2019-08-15 DIAGNOSIS — S61412A Laceration without foreign body of left hand, initial encounter: Secondary | ICD-10-CM | POA: Diagnosis not present

## 2019-08-18 DIAGNOSIS — W06XXXD Fall from bed, subsequent encounter: Secondary | ICD-10-CM | POA: Diagnosis not present

## 2019-08-18 DIAGNOSIS — S61412D Laceration without foreign body of left hand, subsequent encounter: Secondary | ICD-10-CM | POA: Diagnosis not present

## 2019-08-18 DIAGNOSIS — R0789 Other chest pain: Secondary | ICD-10-CM | POA: Diagnosis not present

## 2019-08-23 DIAGNOSIS — S32000A Wedge compression fracture of unspecified lumbar vertebra, initial encounter for closed fracture: Secondary | ICD-10-CM | POA: Diagnosis not present

## 2019-08-23 DIAGNOSIS — L0390 Cellulitis, unspecified: Secondary | ICD-10-CM | POA: Diagnosis not present

## 2019-08-23 DIAGNOSIS — M549 Dorsalgia, unspecified: Secondary | ICD-10-CM | POA: Diagnosis not present

## 2019-08-26 DIAGNOSIS — S32010A Wedge compression fracture of first lumbar vertebra, initial encounter for closed fracture: Secondary | ICD-10-CM | POA: Diagnosis not present

## 2019-08-26 DIAGNOSIS — S32000A Wedge compression fracture of unspecified lumbar vertebra, initial encounter for closed fracture: Secondary | ICD-10-CM | POA: Diagnosis not present

## 2019-09-02 ENCOUNTER — Other Ambulatory Visit: Payer: Self-pay | Admitting: Neurosurgery

## 2019-09-02 DIAGNOSIS — Z6825 Body mass index (BMI) 25.0-25.9, adult: Secondary | ICD-10-CM | POA: Diagnosis not present

## 2019-09-02 DIAGNOSIS — S32010A Wedge compression fracture of first lumbar vertebra, initial encounter for closed fracture: Secondary | ICD-10-CM | POA: Diagnosis not present

## 2019-09-02 DIAGNOSIS — R030 Elevated blood-pressure reading, without diagnosis of hypertension: Secondary | ICD-10-CM | POA: Diagnosis not present

## 2019-09-03 NOTE — Progress Notes (Signed)
[place], [place] [id number] [place] [id number] Phone: [phone] Fax: [phone]      Your procedure is scheduled on Friday May 28  Report to [facility] Main Entrance "A" at 1130 A.M., and check in at the Admitting office.  Call this number if you have problems the morning of surgery:  [phone]  Call [phone] if you have any questions prior to your surgery date Monday-Friday 8am-4pm    Remember:  Do not eat or drink after midnight the night before your surgery    Take these medicines the morning of surgery with A SIP OF WATER  acetaminophen (TYLENOL)  If needed citalopram (CELEXA) Eye drops if needed  Follow your surgeon's instructions on when to stop XARELTO.  If no instructions were given by your surgeon then you will need to call the office to get those instructions.    As of today, STOP taking any Aspirin (unless otherwise instructed by your surgeon) and Aspirin containing products, Aleve, Naproxen, Ibuprofen, Motrin, Advil, Goody's, BC's, all herbal medications, fish oil, and all vitamins.                      Do not wear jewelry, make up, or nail polish            Do not wear lotions, powders, perfumes/colognes, or deodorant.            Do not shave 48 hours prior to surgery.  Men may shave face and neck.            Do not bring valuables to the hospital.            [facility] is not responsible for any belongings or valuables.  Do NOT Smoke (Tobacco/Vapping) or drink Alcohol 24 hours prior to your procedure If you use a CPAP at night, you may bring all equipment for your overnight stay.   Contacts, glasses, dentures or bridgework may not be worn into surgery.      For patients admitted to the hospital, discharge time will be determined by your treatment team.   Patients discharged the day of surgery will not be allowed to drive home, and someone needs to stay with them for 24 hours.    Special instructions:    [facility]- Preparing For Surgery  Before surgery, you can play an important role. Because skin is not sterile, your skin needs to be as free of germs as possible. You can reduce the number of germs on your skin by washing with CHG (chlorahexidine gluconate) Soap before surgery.  CHG is an antiseptic cleaner which kills germs and bonds with the skin to continue killing germs even after washing.    Oral Hygiene is also important to reduce your risk of infection.  Remember - BRUSH YOUR TEETH THE MORNING OF SURGERY WITH YOUR REGULAR TOOTHPASTE  Please do not use if you have an allergy to CHG or antibacterial soaps. If your skin becomes reddened/irritated stop using the CHG.  Do not shave (including legs and underarms) for at least 48 hours prior to first CHG shower. It is OK to shave your face.  Please follow these instructions carefully.   1. Shower the NIGHT BEFORE SURGERY and the MORNING OF SURGERY with CHG Soap.   2. If you chose to wash your hair, wash your hair first as usual with your normal shampoo.  3. After you shampoo, rinse your hair and  body thoroughly to remove the shampoo.  4. Use CHG as you would any other liquid soap. You can apply CHG directly to the skin and wash gently with a scrungie or a clean washcloth.   5. Apply the CHG Soap to your body ONLY FROM THE NECK DOWN.  Do not use on open wounds or open sores. Avoid contact with your eyes, ears, mouth and genitals (private parts). Wash Face and genitals (private parts)  with your normal soap.   6. Wash thoroughly, paying special attention to the area where your surgery will be performed.  7. Thoroughly rinse your body with warm water from the neck down.  8. DO NOT shower/wash with your normal soap after using and rinsing off the CHG Soap.  9. Pat yourself dry with a CLEAN TOWEL.  10. Wear CLEAN PAJAMAS to bed the night before surgery, wear comfortable clothes the morning of surgery  11. Place CLEAN SHEETS on your bed  the night of your first shower and DO NOT SLEEP WITH PETS.   Day of Surgery:   Do not apply any deodorants/lotions.  Please wear clean clothes to the hospital/surgery center.   Remember to brush your teeth WITH YOUR REGULAR TOOTHPASTE.   Please read over the following fact sheets that you were given.

## 2019-09-04 ENCOUNTER — Other Ambulatory Visit (HOSPITAL_COMMUNITY)
Admission: RE | Admit: 2019-09-04 | Discharge: 2019-09-04 | Disposition: A | Discharge status: Home / Self Care | Payer: Medicare Other | Source: Ambulatory Visit | Attending: Neurosurgery | Admitting: Neurosurgery

## 2019-09-04 ENCOUNTER — Encounter (HOSPITAL_COMMUNITY)
Admission: RE | Admit: 2019-09-04 | Discharge: 2019-09-04 | Disposition: A | Discharge status: Home / Self Care | Payer: Medicare Other | Source: Ambulatory Visit | Attending: Neurosurgery | Admitting: Neurosurgery

## 2019-09-04 ENCOUNTER — Encounter (HOSPITAL_COMMUNITY): Payer: Self-pay

## 2019-09-04 ENCOUNTER — Other Ambulatory Visit: Payer: Self-pay

## 2019-09-04 DIAGNOSIS — Z20822 Contact with and (suspected) exposure to covid-19: Secondary | ICD-10-CM | POA: Insufficient documentation

## 2019-09-04 DIAGNOSIS — Z01818 Encounter for other preprocedural examination: Secondary | ICD-10-CM | POA: Insufficient documentation

## 2019-09-04 HISTORY — DX: Unspecified osteoarthritis, unspecified site: M19.90

## 2019-09-04 HISTORY — DX: Polyneuropathy, unspecified: G62.9

## 2019-09-04 HISTORY — DX: Epistaxis: R04.0

## 2019-09-04 LAB — BASIC METABOLIC PANEL
Anion gap: 9 (ref 5–15)
BUN: 19 mg/dL (ref 8–23)
CO2: 29 mmol/L (ref 22–32)
Calcium: 9.1 mg/dL (ref 8.9–10.3)
Chloride: 99 mmol/L (ref 98–111)
Creatinine, Ser: 0.86 mg/dL (ref 0.44–1.00)
GFR calc Af Amer: >60 mL/min (ref >60)
GFR calc non Af Amer: 59 mL/min — ABNORMAL LOW (ref >60)
Glucose, Bld: 92 mg/dL (ref 70–99)
Potassium: 4.3 mmol/L (ref 3.5–5.1)
Sodium: 137 mmol/L (ref 135–145)

## 2019-09-04 LAB — PROTIME-INR
INR: 1.1 (ref 0.8–1.2)
Prothrombin Time: 13.5 seconds (ref 11.4–15.2)

## 2019-09-04 LAB — CBC
HCT: 41.6 % (ref 36.0–46.0)
Hemoglobin: 13.2 g/dL (ref 12.0–15.0)
MCH: 29.3 pg (ref 26.0–34.0)
MCHC: 31.7 g/dL (ref 30.0–36.0)
MCV: 92.2 fL (ref 80.0–100.0)
Platelets: 317 10*3/uL (ref 150–400)
RBC: 4.51 MIL/uL (ref 3.87–5.11)
RDW: 13.5 % (ref 11.5–15.5)
WBC: 8.3 10*3/uL (ref 4.0–10.5)
nRBC: 0 % (ref 0.0–0.2)

## 2019-09-04 LAB — APTT: aPTT: 27 seconds (ref 24–36)

## 2019-09-04 LAB — SURGICAL PCR SCREEN
MRSA, PCR: NEGATIVE
Staphylococcus aureus: POSITIVE — AB

## 2019-09-04 LAB — SARS CORONAVIRUS 2 (TAT 6-24 HRS): SARS Coronavirus 2: NEGATIVE

## 2019-09-04 NOTE — Progress Notes (Signed)
PCP - Gar Ponto, MD at [facility] in [place] - Denies  PPM/ICD - Denies  Chest x-ray - N/A EKG - 09/04/19 Stress Test - Denies ECHO - 2016 (Care Everywhere); patient doesn't remember Cardiac Cath - Denies  Sleep Study -  CPAP -   Patient denies being a diabetic.  Blood Thinner Instructions: Xarelto last dose 09/01/19 Aspirin Instructions: N/A  ERAS Protcol - N/A  COVID TEST- 09/04/19   Anesthesia review: Yes  Patient denies shortness of breath, fever, cough and chest pain at PAT appointment   All instructions explained to the patient, with a verbal understanding of the material. Patient agrees to go over the instructions while at home for a better understanding. Patient also instructed to self quarantine after being tested for COVID-19. The opportunity to ask questions was provided.

## 2019-09-04 NOTE — Progress Notes (Signed)
Called Dr. Lacy Duverney office, requesting orders to be signed in epic.

## 2019-09-04 NOTE — Progress Notes (Signed)
Patient reports she has nosebleeds once a week. PCP aware, has been going on 2 years; PCP decreased Xarelto dose in the past. The bleeds are controlled at home. Pt states if she blows her nose it stops. States it can happen randomly: while eating; laying in bed, and lasts about 1 minute. Last occurrence was last week; pt has not had to go to ED for this.

## 2019-09-05 ENCOUNTER — Encounter (HOSPITAL_COMMUNITY): Payer: Self-pay | Admitting: Neurosurgery

## 2019-09-05 ENCOUNTER — Ambulatory Visit (HOSPITAL_COMMUNITY): Payer: Medicare Other | Admitting: Emergency Medicine

## 2019-09-05 ENCOUNTER — Ambulatory Visit (HOSPITAL_COMMUNITY): Payer: Medicare Other

## 2019-09-05 ENCOUNTER — Observation Stay (HOSPITAL_COMMUNITY)
Admission: RE | Admit: 2019-09-05 | Discharge: 2019-09-06 | Disposition: A | Discharge status: Home / Self Care | Payer: Medicare Other | Attending: Neurosurgery | Admitting: Neurosurgery

## 2019-09-05 ENCOUNTER — Ambulatory Visit (HOSPITAL_COMMUNITY): Payer: Medicare Other | Admitting: Certified Registered"

## 2019-09-05 ENCOUNTER — Encounter (HOSPITAL_COMMUNITY)
Admission: RE | Disposition: A | Discharge status: Home / Self Care | Payer: Self-pay | Source: Home / Self Care | Attending: Neurosurgery

## 2019-09-05 DIAGNOSIS — S32010A Wedge compression fracture of first lumbar vertebra, initial encounter for closed fracture: Secondary | ICD-10-CM | POA: Diagnosis present

## 2019-09-05 DIAGNOSIS — M069 Rheumatoid arthritis, unspecified: Secondary | ICD-10-CM | POA: Insufficient documentation

## 2019-09-05 DIAGNOSIS — Z86718 Personal history of other venous thrombosis and embolism: Secondary | ICD-10-CM | POA: Insufficient documentation

## 2019-09-05 DIAGNOSIS — Z419 Encounter for procedure for purposes other than remedying health state, unspecified: Secondary | ICD-10-CM

## 2019-09-05 DIAGNOSIS — W19XXXA Unspecified fall, initial encounter: Secondary | ICD-10-CM | POA: Insufficient documentation

## 2019-09-05 DIAGNOSIS — S32019A Unspecified fracture of first lumbar vertebra, initial encounter for closed fracture: Principal | ICD-10-CM | POA: Insufficient documentation

## 2019-09-05 DIAGNOSIS — F419 Anxiety disorder, unspecified: Secondary | ICD-10-CM | POA: Diagnosis not present

## 2019-09-05 DIAGNOSIS — Z96642 Presence of left artificial hip joint: Secondary | ICD-10-CM | POA: Insufficient documentation

## 2019-09-05 DIAGNOSIS — Z881 Allergy status to other antibiotic agents status: Secondary | ICD-10-CM | POA: Diagnosis not present

## 2019-09-05 DIAGNOSIS — Z981 Arthrodesis status: Secondary | ICD-10-CM | POA: Diagnosis not present

## 2019-09-05 DIAGNOSIS — Z880 Allergy status to penicillin: Secondary | ICD-10-CM | POA: Insufficient documentation

## 2019-09-05 DIAGNOSIS — Z79899 Other long term (current) drug therapy: Secondary | ICD-10-CM | POA: Insufficient documentation

## 2019-09-05 DIAGNOSIS — Z7901 Long term (current) use of anticoagulants: Secondary | ICD-10-CM | POA: Diagnosis not present

## 2019-09-05 HISTORY — PX: KYPHOPLASTY: SHX5884

## 2019-09-05 SURGERY — KYPHOPLASTY
Anesthesia: General

## 2019-09-05 MED ORDER — VANCOMYCIN HCL IN DEXTROSE 1-5 GM/200ML-% IV SOLN
INTRAVENOUS | Status: AC
  Administered 2019-09-05: 1000 mg via INTRAVENOUS
  Filled 2019-09-05: qty 200

## 2019-09-05 MED ORDER — ACETAMINOPHEN 500 MG PO TABS
1000.0000 mg | ORAL_TABLET | Freq: Four times a day (QID) | ORAL | Status: DC | PRN
  Administered 2019-09-05 – 2019-09-06 (×2): 1000 mg via ORAL
  Filled 2019-09-05 (×2): qty 2

## 2019-09-05 MED ORDER — CITALOPRAM HYDROBROMIDE 10 MG PO TABS
10.0000 mg | ORAL_TABLET | Freq: Every day | ORAL | Status: DC
  Administered 2019-09-05: 10 mg via ORAL
  Filled 2019-09-05: qty 1

## 2019-09-05 MED ORDER — VITAMIN D 25 MCG (1000 UNIT) PO TABS: 2000.0000 [IU] | ORAL_TABLET | Freq: Every day | ORAL | Status: DC

## 2019-09-05 MED ORDER — CITALOPRAM HYDROBROMIDE 20 MG PO TABS: 10.0000 mg | ORAL_TABLET | Freq: Every day | ORAL | Status: DC

## 2019-09-05 MED ORDER — CHLORHEXIDINE GLUCONATE 0.12 % MT SOLN
15.0000 mL | Freq: Once | OROMUCOSAL | Status: AC
  Administered 2019-09-05: 15 mL via OROMUCOSAL
  Filled 2019-09-05: qty 15

## 2019-09-05 MED ORDER — LACTATED RINGERS IV SOLN: INTRAVENOUS | Status: DC

## 2019-09-05 MED ORDER — VANCOMYCIN HCL 1000 MG IV SOLR
INTRAVENOUS | Status: DC | PRN
  Administered 2019-09-05: 1000 mg via INTRAVENOUS

## 2019-09-05 MED ORDER — ORAL CARE MOUTH RINSE: 15.0000 mL | Freq: Once | OROMUCOSAL | Status: AC

## 2019-09-05 MED ORDER — HYDROCODONE-ACETAMINOPHEN 5-325 MG PO TABS: 1.0000 | ORAL_TABLET | Freq: Four times a day (QID) | ORAL | 0 refills | Status: DC | PRN

## 2019-09-05 MED ORDER — LIDOCAINE-EPINEPHRINE 0.5 %-1:200000 IJ SOLN
INTRAMUSCULAR | Status: DC | PRN
  Administered 2019-09-05: 9 mL

## 2019-09-05 MED ORDER — VANCOMYCIN HCL IN DEXTROSE 1-5 GM/200ML-% IV SOLN: 1000.0000 mg | INTRAVENOUS | Status: AC

## 2019-09-05 MED ORDER — FENTANYL CITRATE (PF) 250 MCG/5ML IJ SOLN
INTRAMUSCULAR | Status: AC
  Filled 2019-09-05: qty 5

## 2019-09-05 MED ORDER — METHOTREXATE 2.5 MG PO TABS: 15.0000 mg | ORAL_TABLET | ORAL | Status: DC

## 2019-09-05 MED ORDER — ONDANSETRON HCL 4 MG/2ML IJ SOLN: 4.0000 mg | Freq: Once | INTRAMUSCULAR | Status: DC | PRN

## 2019-09-05 MED ORDER — POTASSIUM CHLORIDE IN NACL 20-0.9 MEQ/L-% IV SOLN: INTRAVENOUS | Status: DC

## 2019-09-05 MED ORDER — IOPAMIDOL (ISOVUE-300) INJECTION 61%
INTRAVENOUS | Status: DC | PRN
  Administered 2019-09-05: 50 mL

## 2019-09-05 MED ORDER — CHLORHEXIDINE GLUCONATE CLOTH 2 % EX PADS: 6.0000 | MEDICATED_PAD | Freq: Once | CUTANEOUS | Status: DC

## 2019-09-05 MED ORDER — FENTANYL CITRATE (PF) 100 MCG/2ML IJ SOLN: 25.0000 ug | INTRAMUSCULAR | Status: DC | PRN

## 2019-09-05 MED ORDER — SUGAMMADEX SODIUM 200 MG/2ML IV SOLN
INTRAVENOUS | Status: DC | PRN
  Administered 2019-09-05: 200 mg via INTRAVENOUS

## 2019-09-05 MED ORDER — FOLIC ACID 1 MG PO TABS: 1.0000 mg | ORAL_TABLET | Freq: Every day | ORAL | Status: DC

## 2019-09-05 MED ORDER — LIDOCAINE 2% (20 MG/ML) 5 ML SYRINGE
INTRAMUSCULAR | Status: DC | PRN
  Administered 2019-09-05: 40 mg via INTRAVENOUS

## 2019-09-05 MED ORDER — ONDANSETRON HCL 4 MG/2ML IJ SOLN
INTRAMUSCULAR | Status: AC
  Filled 2019-09-05: qty 2

## 2019-09-05 MED ORDER — LATANOPROST 0.005 % OP SOLN
1.0000 [drp] | Freq: Every day | OPHTHALMIC | Status: DC
  Administered 2019-09-05: 1 [drp] via OPHTHALMIC
  Filled 2019-09-05: qty 2.5

## 2019-09-05 MED ORDER — FENTANYL CITRATE (PF) 250 MCG/5ML IJ SOLN
INTRAMUSCULAR | Status: DC | PRN
  Administered 2019-09-05 (×2): 50 ug via INTRAVENOUS

## 2019-09-05 MED ORDER — PROPOFOL 10 MG/ML IV BOLUS
INTRAVENOUS | Status: DC | PRN
  Administered 2019-09-05: 100 mg via INTRAVENOUS

## 2019-09-05 MED ORDER — ACETAMINOPHEN 500 MG PO TABS: 1000.0000 mg | ORAL_TABLET | Freq: Four times a day (QID) | ORAL | Status: DC | PRN

## 2019-09-05 MED ORDER — PRESERVISION AREDS PO CAPS: ORAL_CAPSULE | Freq: Every day | ORAL | Status: DC

## 2019-09-05 MED ORDER — 0.9 % SODIUM CHLORIDE (POUR BTL) OPTIME
TOPICAL | Status: DC | PRN
  Administered 2019-09-05: 1000 mL

## 2019-09-05 MED ORDER — HYDROCODONE-ACETAMINOPHEN 5-325 MG PO TABS
1.0000 | ORAL_TABLET | ORAL | Status: DC | PRN
  Administered 2019-09-06: 1 via ORAL
  Filled 2019-09-05: qty 1

## 2019-09-05 MED ORDER — DEXAMETHASONE SODIUM PHOSPHATE 10 MG/ML IJ SOLN
INTRAMUSCULAR | Status: DC | PRN
  Administered 2019-09-05: 10 mg via INTRAVENOUS

## 2019-09-05 MED ORDER — PHENYLEPHRINE HCL-NACL 10-0.9 MG/250ML-% IV SOLN
INTRAVENOUS | Status: DC | PRN
Start: 2019-09-05 — End: 2019-09-05
  Administered 2019-09-05: 20 ug/min via INTRAVENOUS

## 2019-09-05 MED ORDER — ONDANSETRON HCL 4 MG/2ML IJ SOLN: 4.0000 mg | Freq: Four times a day (QID) | INTRAMUSCULAR | Status: DC | PRN

## 2019-09-05 MED ORDER — LIDOCAINE-EPINEPHRINE 0.5 %-1:200000 IJ SOLN
INTRAMUSCULAR | Status: AC
  Filled 2019-09-05: qty 1

## 2019-09-05 MED ORDER — ONDANSETRON HCL 4 MG/2ML IJ SOLN
4.0000 mg | Freq: Four times a day (QID) | INTRAMUSCULAR | Status: DC
  Administered 2019-09-05: 4 mg via INTRAVENOUS

## 2019-09-05 MED ORDER — PROSIGHT PO TABS
1.0000 | ORAL_TABLET | Freq: Every day | ORAL | Status: DC
  Filled 2019-09-05: qty 1

## 2019-09-05 MED ORDER — ROCURONIUM BROMIDE 10 MG/ML (PF) SYRINGE
PREFILLED_SYRINGE | INTRAVENOUS | Status: DC | PRN
  Administered 2019-09-05: 50 mg via INTRAVENOUS

## 2019-09-05 SURGICAL SUPPLY — 42 items
BLADE CLIPPER SURG (BLADE) IMPLANT
BLADE SURG 15 STRL LF DISP TIS (BLADE) ×1 IMPLANT
BLADE SURG 15 STRL SS (BLADE) ×2
CARTRIDGE OIL MAESTRO DRILL (MISCELLANEOUS) IMPLANT
CEMENT KYPHON C01A KIT/MIXER (Cement) ×3 IMPLANT
CNTNR URN SCR LID CUP LEK RST (MISCELLANEOUS) ×1 IMPLANT
CONT SPEC 4OZ STRL OR WHT (MISCELLANEOUS) ×2
COVER WAND RF STERILE (DRAPES) IMPLANT
CURETTE EXPRESS SZ2 7MM (INSTRUMENTS) ×1 IMPLANT
CURRETTE EXPRESS SZ2 7MM (INSTRUMENTS) ×3
DERMABOND ADVANCED (GAUZE/BANDAGES/DRESSINGS) ×2
DERMABOND ADVANCED .7 DNX12 (GAUZE/BANDAGES/DRESSINGS) ×1 IMPLANT
DIFFUSER DRILL AIR PNEUMATIC (MISCELLANEOUS) IMPLANT
DRAPE C-ARM 42X72 X-RAY (DRAPES) ×3 IMPLANT
DRAPE HALF SHEET 40X57 (DRAPES) ×3 IMPLANT
DRAPE INCISE IOBAN 66X45 STRL (DRAPES) ×3 IMPLANT
DRAPE LAPAROTOMY 100X72X124 (DRAPES) ×3 IMPLANT
DRAPE SURG 17X23 STRL (DRAPES) ×3 IMPLANT
DRAPE WARM FLUID 44X44 (DRAPES) ×3 IMPLANT
DURAPREP 26ML APPLICATOR (WOUND CARE) ×3 IMPLANT
GAUZE 4X4 16PLY RFD (DISPOSABLE) ×3 IMPLANT
GLOVE ECLIPSE 6.5 STRL STRAW (GLOVE) ×3 IMPLANT
GLOVE EXAM NITRILE XL STR (GLOVE) IMPLANT
GOWN STRL REUS W/ TWL LRG LVL3 (GOWN DISPOSABLE) ×2 IMPLANT
GOWN STRL REUS W/ TWL XL LVL3 (GOWN DISPOSABLE) IMPLANT
GOWN STRL REUS W/TWL 2XL LVL3 (GOWN DISPOSABLE) IMPLANT
GOWN STRL REUS W/TWL LRG LVL3 (GOWN DISPOSABLE) ×4
GOWN STRL REUS W/TWL XL LVL3 (GOWN DISPOSABLE)
KIT BASIN OR (CUSTOM PROCEDURE TRAY) ×3 IMPLANT
KIT TURNOVER KIT B (KITS) ×3 IMPLANT
NEEDLE HYPO 25X1 1.5 SAFETY (NEEDLE) ×3 IMPLANT
NS IRRIG 1000ML POUR BTL (IV SOLUTION) ×3 IMPLANT
OIL CARTRIDGE MAESTRO DRILL (MISCELLANEOUS)
PACK SURGICAL SETUP 50X90 (CUSTOM PROCEDURE TRAY) ×3 IMPLANT
PAD ARMBOARD 7.5X6 YLW CONV (MISCELLANEOUS) ×9 IMPLANT
SPECIMEN JAR SMALL (MISCELLANEOUS) IMPLANT
STAPLER SKIN PROX WIDE 3.9 (STAPLE) ×3 IMPLANT
SUT VIC AB 3-0 SH 8-18 (SUTURE) ×3 IMPLANT
SYR CONTROL 10ML LL (SYRINGE) ×6 IMPLANT
TOWEL GREEN STERILE (TOWEL DISPOSABLE) ×3 IMPLANT
TOWEL GREEN STERILE FF (TOWEL DISPOSABLE) ×3 IMPLANT
TRAY KYPHOPAK 20/3 ONESTEP 1ST (MISCELLANEOUS) ×3 IMPLANT

## 2019-09-05 NOTE — Op Note (Signed)
09/05/2019  4:22 PM  PATIENT:  Alexandria Weaver  86 y.o. female  PRE-OPERATIVE DIAGNOSIS:  Compression fracture of lumbar vertebra 1 POST-OPERATIVE DIAGNOSIS:  Compression fracture of lumbar vertebra 1 PROCEDURE:  Procedure(s): Lumbar One Kyphoplasty  SURGEON:  Surgeon(s): Ashok Pall, MD  ANESTHESIA:   general  EBL:  Total I/O In: 200 [I.V.:200] Out: -   BLOOD ADMINISTERED:none  COUNT:per nursing  SPECIMEN:  No Specimen  DICTATION: [name] was taken to the operating room, intubated and placed under a general anesthetic without difficulty. She was positioned prone on the operating room table with all pressure points properly padded. Her back was prepped and draped in a sterile manner. With fluoroscopy I localized the L1 pedicles bilaterally. I injected lidocaine into the entry sites on both the left and right sides. I started by making a stab incision on the left side and entering the left pedicle with fluoroscopic guidance. Once good position was obtained, I drilled into the vertebral body. I then placed the kyphoplasty balloon into the L1 vertebra and inflated the balloon. I then inserted 3cc of methylmethacrylate into the vertebral body under fluoroscopic guidance. The same procedure was performed on the right side.  I achieved a good fill of the cavity, staying within the confines of the vertebral body. I removed the instrumentation from the vertebral body, and the final films looked good. I closed the stab incision with vicryl suture and used Dermabond for a sterile dressing. Marland Kitchen    PLAN OF CARE: Admit to inpatient   PATIENT DISPOSITION:  PACU - hemodynamically stable.   Delay start of Pharmacological VTE agent (>24hrs) due to surgical blood loss or risk of bleeding:  yes

## 2019-09-05 NOTE — Transfer of Care (Signed)
Immediate Anesthesia Transfer of Care Note  Patient: Alexandria Weaver  Procedure(s) Performed: Lumbar One Kyphoplasty (N/A )  Patient Location: PACU  Anesthesia Type:General  Level of Consciousness: awake, alert  and oriented  Airway & Oxygen Therapy: Patient Spontanous Breathing and Patient connected to nasal cannula oxygen  Post-op Assessment: Report given to RN and Post -op Vital signs reviewed and stable  Post vital signs: Reviewed and stable  Last Vitals:  Vitals Value Taken Time  BP 179/78 09/05/19 1619  Temp 36.3 C 09/05/19 1615  Pulse 74 09/05/19 1625  Resp 16 09/05/19 1625  SpO2 100 % 09/05/19 1625  Vitals shown include unvalidated device data.  Last Pain:  Vitals:   09/05/19 1615  TempSrc:   PainSc: 0-No pain      Patients Stated Pain Goal: 3 ([id number])  Complications: No apparent anesthesia complications

## 2019-09-05 NOTE — Anesthesia Preprocedure Evaluation (Addendum)
Anesthesia Evaluation  Patient identified by MRN, date of birth, ID band Patient awake    Reviewed: Allergy & Precautions, NPO status , Patient's Chart, lab work & pertinent test results  Airway Mallampati: II  TM Distance: >3 FB Neck ROM: Full    Dental  (+) Edentulous Upper, Edentulous Lower   Pulmonary neg pulmonary ROS,    breath sounds clear to auscultation       Cardiovascular negative cardio ROS   Rhythm:Regular Rate:Normal     Neuro/Psych Anxiety negative neurological ROS     GI/Hepatic negative GI ROS, Neg liver ROS,   Endo/Other  negative endocrine ROS  Renal/GU negative Renal ROS     Musculoskeletal  (+) Arthritis , Rheumatoid disorders,    Abdominal   Peds  Hematology negative hematology ROS (+)   Anesthesia Other Findings   Reproductive/Obstetrics                            Anesthesia Physical Anesthesia Plan  ASA: III  Anesthesia Plan: General   Post-op Pain Management:    Induction: Intravenous  PONV Risk Score and Plan: 3 and Ondansetron and Treatment may vary due to age or medical condition  Airway Management Planned: Oral ETT  Additional Equipment: None  Intra-op Plan:   Post-operative Plan: Extubation in OR  Informed Consent:   Plan Discussed with: CRNA  Anesthesia Plan Comments: (TTE 06/08/2014 (copy on chart): Conclusions: 1.  Normal LV chamber dimension, wall thickness and contractility.  Normal LV ejection fraction. 2.  Normal for age diastolic function. 3.  Normal RV size and function. 4.  Normal mitral valve.  Trivial to mild MR. 5.  Normal tricuspid valve.  Mild TR. 6.  Aortic valve is trileaflet with no evidence of stenosis.  Mild aortic regurgitation. 7.  No pericardial effusion.)       Anesthesia Quick Evaluation

## 2019-09-05 NOTE — Anesthesia Procedure Notes (Signed)
Procedure Name: Intubation Date/Time: 09/05/2019 3:12 PM Performed by: Clearnce Sorrel, CRNA Pre-anesthesia Checklist: Patient identified, Emergency Drugs available, Suction available, Patient being monitored and Timeout performed Patient Re-evaluated:Patient Re-evaluated prior to induction Oxygen Delivery Method: Circle system utilized Preoxygenation: Pre-oxygenation with 100% oxygen Induction Type: IV induction Ventilation: Mask ventilation without difficulty and Oral airway inserted - appropriate to patient size Laryngoscope Size: Mac and 3 Grade View: Grade I Tube type: Oral Tube size: 7.0 mm Number of attempts: 1 Placement Confirmation: ETT inserted through vocal cords under direct vision,  positive ETCO2 and breath sounds checked- equal and bilateral Secured at: 22 cm Tube secured with: Tape Dental Injury: Teeth and Oropharynx as per pre-operative assessment

## 2019-09-05 NOTE — Discharge Instructions (Addendum)
  Wound Care Leave incision open to air. You may shower. Do not scrub directly on incision.  Do not put any creams, lotions, or ointments on incision. Activity Walk each and every day, increasing distance each day. No lifting greater than 5 lbs.  Avoid bending, arching, and twisting. No driving for 2 weeks; may ride as a passenger locally. If provided with back brace, wear when out of bed.  It is not necessary to wear in bed. Diet Resume your normal diet.  Return to Work Will be discussed at you follow up appointment. Call Your Doctor If Any of These Occur Redness, drainage, or swelling at the wound.  Temperature greater than 101 degrees. Severe pain not relieved by pain medication. Incision starts to come apart. Follow Up Appt. Call today for appointment in 4 weeks [id number]) or for problems.  If you have any hardware placed in your spine, you will need an x-ray before your appointment.         Care After These instructions give you information on caring for yourself after your procedure. Your doctor may also give you more specific instructions. Call your doctor if you have any problems or questions after your procedure. HOME CARE  Take medicine as told by your doctor.  Keep your wound covered for 24 hours or as told by your doctor.  Ask your doctor when you can bathe or shower.  Put ice on your wound if it helps your pain.  Put ice in a plastic bag.  Place a towel between your skin and the bag.  Leave the ice on for 15 to 20 minutes, 3 to 4 times a day.    Return to normal activities as told by your doctor.  Ask your doctor what stretches and exercises you can do.  Do not bend or lift anything heavy as told by your doctor. GET HELP RIGHT AWAY IF:   You have bad back pain that comes on suddenly.  You cannot control when you pee (urinate) or poop (bowel movement).  You lose feeling (numbness) in your legs or feet, or they become weak.  You have shooting  pain down your legs.  You have a fever.  Your wound becomes red, puffy (swollen), or tender to the touch.  You are bleeding or leaking fluid from the wound.  You are sick to your stomach (nauseous) or throw up (vomit) for more than 24 hours after the procedure.  Your back pain does not get better. MAKE SURE YOU:  Understand these instructions.  Will watch your condition.  Will get help right away if you are not doing well or get worse. Document Released: 06/21/2009 Document Revised: 06/19/2011 Document Reviewed: 08/31/2010 [facility] Patient Information 2013 [place].

## 2019-09-05 NOTE — H&P (Signed)
BP (!) 179/65   Pulse 76   Temp 98 F (36.7 C) (Oral)   Resp 20   Ht 5\' 4"  (1.626 m)   Wt 65.5 kg   SpO2 100%   BMI 24.79 kg/m    Alexandria Weaver comes in today after taking a fall on May 7th.  After that, approximately a day later when she went to see her physician, she started to complain of low back pain.  She said it went midway up the back and became more persistent afterwards.  She eventually underwent an MRI of the lumbar spine which, showed an acute compression fracture at L1 with edematous bone, and she was sent to me for further evaluation.  She comes today without wearing a brace.   Allergies  Allergen Reactions  . Doxycycline Nausea Only  . Penicillins Rash   Past Medical History:  Diagnosis Date  . Acute deep vein thrombosis (DVT) of distal end of left lower extremity ([place])   . Anxiety   . Arthritis    RA  . Frequent nosebleeds    once a week  . Neuropathy    Past Surgical History:  Procedure Laterality Date  . ABDOMINAL HYSTERECTOMY    . CATARACT EXTRACTION, BILATERAL    . TOTAL HIP ARTHROPLASTY Left 2002   Family History  Problem Relation Age of Onset  . Asthma Son   . Allergic rhinitis Son   . Factor V Leiden deficiency Daughter   . Lymphoma Daughter   . Angioedema Neg Hx   . Eczema Neg Hx   . Urticaria Neg Hx   . Immunodeficiency Neg Hx    Social History   Socioeconomic History  . Marital status: Widowed    Spouse name: Not on file  . Number of children: Not on file  . Years of education: Not on file  . Highest education level: Not on file  Occupational History  . Not on file  Tobacco Use  . Smoking status: Never Smoker  . Smokeless tobacco: Never Used  Substance and Sexual Activity  . Alcohol use: Never  . Drug use: Never  . Sexual activity: Not on file  Other Topics Concern  . Not on file  Social History Narrative  . Not on file   Social Determinants of Health   Financial Resource Strain:   . Difficulty of Paying Living Expenses:    Food Insecurity:   . Worried About Charity fundraiser in the Last Year:   . Arboriculturist in the Last Year:   Transportation Needs:   . Film/video editor (Medical):   Marland Kitchen Lack of Transportation (Non-Medical):   Physical Activity:   . Days of Exercise per Week:   . Minutes of Exercise per Session:   Stress:   . Feeling of Stress :   Social Connections:   . Frequency of Communication with Friends and Family:   . Frequency of Social Gatherings with Friends and Family:   . Attends Religious Services:   . Active Member of Clubs or Organizations:   . Attends Archivist Meetings:   Marland Kitchen Marital Status:   Intimate Partner Violence:   . Fear of Current or Ex-Partner:   . Emotionally Abused:   Marland Kitchen Physically Abused:   . Sexually Abused:    Prior to Admission medications   Medication Sig Start Date End Date Taking? Authorizing Provider  acetaminophen (TYLENOL) 500 MG tablet Take 1,000 mg by mouth every 6 (six) hours as needed  for moderate pain.    Yes [provider]  Cholecalciferol (VITAMIN D) 2000 units tablet Take 2,000 Units by mouth daily.   Yes [provider]  citalopram (CELEXA) 10 MG tablet Take 10 mg by mouth at bedtime.    Yes [provider]  folic acid (FOLVITE) 1 MG tablet Take 1 mg by mouth daily.   Yes [provider]  latanoprost (XALATAN) 0.005 % ophthalmic solution Place 1 drop into both eyes at bedtime.  10/11/15  Yes [provider]  methotrexate 2.5 MG tablet Take 15 mg by mouth once a week. 08/24/19  Yes [provider]  Multiple Vitamins-Minerals (PRESERVISION AREDS PO) Take 1 capsule by mouth daily.   Yes [provider]  XARELTO 15 MG TABS tablet Take 15 mg by mouth daily with supper.  08/03/15  Yes [provider]       On exam, Alexandria Weaver who is 17, about to turn 81, has full strength in the lower extremities.  She moves slowly with her walker, slightly stooped in posture.   Pupils equal, round, and reactive to light.  She has full extraocular movements.  She has full visual fields.  Hearing is intact to voice, though diminished significantly.  She has 2+ reflexes at the biceps, triceps, brachioradialis, knees, and ankles.  Proprioception is intact.  Romberg is negative.     MRI is reviewed and it does show an edematous bone with very little in the way of height loss, but obviously fractured.  There is nothing about it that looks pathologic.       She would, after we discussed possible treatment options, undergo the kyphoplasty.  She is active and does not want to have to wait the 3 months that bracing and conservative treatment would entail, which I think works perfectly fine, but she would like to go ahead with a kyphoplasty.  I told her this also would mean a brace, but for a much shorter period of time, maybe 2-3 weeks.  The risks and benefits were explained.  She understands and wishes to proceed.

## 2019-09-05 NOTE — Discharge Summary (Signed)
Physician Discharge Summary  Patient ID: Alexandria Weaver MRN: [id number] DOB/AGE: 84-Aug-1926 84 y.o.  Admit date: 09/05/2019 Discharge date: 09/05/2019  Admission Diagnoses:L1 compression fracture  Discharge Diagnoses: same Active Problems:   Closed compression fracture of body of L1 vertebra [facility])   Discharged Condition: good  Hospital Course: Alexandria Weaver was admitted and taken to the operating room for an uncomplicated kyphoplasty at L1. She is neurologically doing well, moving both lower extremities  Treatments: surgery: as above  Discharge Exam: Blood pressure (!) 158/82, pulse 79, temperature 97.7 F (36.5 C), temperature source Oral, resp. rate 17, height 5\' 4"  (1.626 m), weight 65.5 kg, SpO2 98 %. General appearance: alert, cooperative, appears stated age and no distress  Disposition: Discharge disposition: 01-Home or Self Care      Compression fracture of lumbar vertebra  Allergies as of 09/05/2019      Reactions   Doxycycline Nausea Only   Penicillins Rash      Medication List    TAKE these medications   acetaminophen 500 MG tablet Commonly known as: TYLENOL Take 1,000 mg by mouth every 6 (six) hours as needed for moderate pain.   citalopram 10 MG tablet Commonly known as: CELEXA Take 10 mg by mouth at bedtime.   folic acid 1 MG tablet Commonly known as: FOLVITE Take 1 mg by mouth daily.   HYDROcodone-acetaminophen 5-325 MG tablet Commonly known as: NORCO/VICODIN Take 1 tablet by mouth every 6 (six) hours as needed for moderate pain.   latanoprost 0.005 % ophthalmic solution Commonly known as: XALATAN Place 1 drop into both eyes at bedtime.   methotrexate 2.5 MG tablet Take 15 mg by mouth once a week.   PRESERVISION AREDS PO Take 1 capsule by mouth daily.   Vitamin D 50 MCG (2000 UT) tablet Take 2,000 Units by mouth daily.   Xarelto 15 MG Tabs tablet Generic drug: Rivaroxaban Take 15 mg by mouth daily with supper.      Follow-up  Information    Ashok Pall, MD Follow up in 3 week(s).   Specialty: Neurosurgery Why: please call to make an appointment Contact information: [address] [place] [address] [phone]           Signed: Ashok Pall 09/05/2019, 6:19 PM

## 2019-09-05 NOTE — Progress Notes (Signed)
Orthopedic Tech Progress Note Patient Details:  Alexandria Weaver 06/22/1924 [id number] Fitted patient for BACK BRACE Patient ID: Alexandria Weaver, female   DOB: [birth year], 86 y.o.   MRN: [id number]   Janit Pagan 09/05/2019, 6:52 PM

## 2019-09-06 DIAGNOSIS — M069 Rheumatoid arthritis, unspecified: Secondary | ICD-10-CM | POA: Diagnosis not present

## 2019-09-06 DIAGNOSIS — Z7901 Long term (current) use of anticoagulants: Secondary | ICD-10-CM | POA: Diagnosis not present

## 2019-09-06 DIAGNOSIS — Z79899 Other long term (current) drug therapy: Secondary | ICD-10-CM | POA: Diagnosis not present

## 2019-09-06 DIAGNOSIS — Z96642 Presence of left artificial hip joint: Secondary | ICD-10-CM | POA: Diagnosis not present

## 2019-09-06 DIAGNOSIS — S32019A Unspecified fracture of first lumbar vertebra, initial encounter for closed fracture: Secondary | ICD-10-CM | POA: Diagnosis not present

## 2019-09-06 DIAGNOSIS — Z86718 Personal history of other venous thrombosis and embolism: Secondary | ICD-10-CM | POA: Diagnosis not present

## 2019-09-06 NOTE — Evaluation (Signed)
Occupational Therapy Evaluation Patient Details Name: Alexandria Weaver MRN: [id number] DOB: [birth year] Today's Date: 09/06/2019    History of Present Illness This 86 y.o. female admitted for kyphoplasty after sustaining a compression fx at L1 on 08/15/2019.  PMH includes: neuropathy, anxiety, h/o DVT, s/p Lt THA    Clinical Impression   Patient evaluated by Occupational Therapy with no further acute OT needs identified. All education has been completed and the patient has no further questions.All education completed.  See below for any follow-up Occupational Therapy or equipment needs. OT is signing off. Thank you for this referral.      Follow Up Recommendations  No OT follow up;Supervision/Assistance - 24 hour;Supervision - Intermittent(24 hour initially, then intermittent )    Equipment Recommendations  None recommended by OT    Recommendations for Other Services       Precautions / Restrictions Precautions Precautions: Other (comment);None(s/p kyphoplasty ) Required Braces or Orthoses: Spinal Brace Spinal Brace: Thoracolumbosacral orthotic      Mobility Bed Mobility               General bed mobility comments: sitting on EOB   Transfers Overall transfer level: Needs assistance   Transfers: Sit to/from Stand;Stand Pivot Transfers Sit to Stand: Supervision;Min assist Stand pivot transfers: Supervision       General transfer comment: Pt demonstrates difficulty moving sit to stand from her bed.  She was able to perform with supervision x 1, but as she fatigued required min A     Balance Overall balance assessment: Needs assistance Sitting-balance support: Feet supported Sitting balance-Leahy Scale: Good     Standing balance support: No upper extremity supported;During functional activity Standing balance-Leahy Scale: Fair Standing balance comment: able to maintain static standing with supervision                            ADL either performed or  assessed with clinical judgement   ADL Overall ADL's : Needs assistance/impaired Eating/Feeding: Independent   Grooming: Wash/dry hands;Wash/dry face;Oral care;Brushing hair;Supervision/safety;Standing   Upper Body Bathing: Set up;Sitting   Lower Body Bathing: Supervison/ safety;Sit to/from stand   Upper Body Dressing : Set up;Sitting   Lower Body Dressing: Sit to/from stand;Minimal assistance Lower Body Dressing Details (indicate cue type and reason): requires assist to tie shoes.  Tried use of shoe buttons, but pt demonstrated difficulty using them.  Suggested that pt try elastic shoe laces, and daughter verbalized understanding  Toilet Transfer: Supervision/safety;Ambulation;Comfort height toilet;Grab bars;RW Armed forces technical officer Details (indicate cue type and reason): problem solved with pt and daughter re: least restrictive clothing to increase ease of toileting with back brace  Toileting- Clothing Manipulation and Hygiene: Supervision/safety;Sit to/from stand       Functional mobility during ADLs: Supervision/safety;Rolling walker       Vision Baseline Vision/History: Wears glasses;Glaucoma;Macular Degeneration Wears Glasses: At all times Patient Visual Report: No change from baseline       Perception     Praxis      Pertinent Vitals/Pain Pain Assessment: Faces Faces Pain Scale: Hurts a little bit Pain Location: low back pain  Pain Descriptors / Indicators: Aching Pain Intervention(s): Monitored during session     Hand Dominance Right   Extremity/Trunk Assessment Upper Extremity Assessment Upper Extremity Assessment: Generalized weakness;RUE deficits/detail;LUE deficits/detail RUE Deficits / Details: arthritic deformity bil. hands  RUE Coordination: decreased fine motor LUE Deficits / Details: arthritic deformity bil. hands  LUE Coordination: decreased fine motor  Lower Extremity Assessment Lower Extremity Assessment: Defer to PT evaluation   Cervical / Trunk  Assessment Cervical / Trunk Assessment: Kyphotic   Communication Communication Communication: No difficulties   Cognition Arousal/Alertness: Awake/alert Behavior During Therapy: WFL for tasks assessed/performed Overall Cognitive Status: Within Functional Limits for tasks assessed                                     General Comments  Daughter present and supportive.  Pt instructed how to don/doff brace and requires min A     Exercises     Shoulder Instructions      Home Living Family/patient expects to be discharged to:: Private residence Living Arrangements: Alone Available Help at Discharge: Family;Available PRN/intermittently Type of Home: Apartment             Bathroom Shower/Tub: Walk-in Psychologist, prison and probation services: Handicapped height Bathroom Accessibility: Yes How Accessible: Accessible via walker Home Equipment: Walker - 4 wheels;Grab bars - toilet;Grab bars - tub/shower;Hand held shower head;Shower seat;Adaptive equipment;Other (comment)(lift chair ) Adaptive Equipment: Reacher        Prior Functioning/Environment Level of Independence: Independent with assistive device(s)        Comments: uses Rollator, but is mod I with ADLs.  She has not driven since her fall         OT Problem List: Decreased strength;Decreased activity tolerance;Impaired balance (sitting and/or standing);Decreased knowledge of use of DME or AE;Pain      OT Treatment/Interventions: Self-care/ADL training;DME and/or AE instruction;Therapeutic activities;Patient/family education;Balance training    OT Goals(Current goals can be found in the care plan section) Acute Rehab OT Goals Patient Stated Goal: to go home and have less pain  OT Goal Formulation: All assessment and education complete, DC therapy  OT Frequency: Min 2X/week   Barriers to D/C:            Co-evaluation              AM-PAC OT "6 Clicks" Daily Activity     Outcome Measure Help from another  person eating meals?: None Help from another person taking care of personal grooming?: A Little Help from another person toileting, which includes using toliet, bedpan, or urinal?: A Little Help from another person bathing (including washing, rinsing, drying)?: A Little Help from another person to put on and taking off regular upper body clothing?: A Little Help from another person to put on and taking off regular lower body clothing?: A Little 6 Click Score: 19   End of Session Equipment Utilized During Treatment: Rolling walker;Back brace Nurse Communication: Mobility status  Activity Tolerance: Patient tolerated treatment well Patient left: in bed;with call bell/phone within reach;with family/visitor present(sitting EOB )  OT Visit Diagnosis: Unsteadiness on feet (R26.81);Pain Pain - part of body: (back )                Time: [id number] OT Time Calculation (min): 48 min Charges:  OT General Charges $OT Visit: 1 Visit OT Evaluation $OT Eval Moderate Complexity: 1 Mod OT Treatments $Self Care/Home Management : 23-37 mins  Nilsa Nutting., OTR/L Acute Rehabilitation Services Pager [phone] Office [place], [place] 09/06/2019, 10:23 AM

## 2019-09-06 NOTE — Progress Notes (Signed)
  NEUROSURGERY PROGRESS NOTE   No issues overnight.  No concerns this am  EXAM:  BP (!) 107/42 (BP Location: Right Arm)   Pulse 64   Temp 97.7 F (36.5 C) (Oral)   Resp 18   Ht 5\' 4"  (1.626 m)   Wt 65.5 kg   SpO2 97%   BMI 24.79 kg/m   Awake, alert, oriented  Speech fluent, appropriate  CN grossly intact  5/5 BUE/BLE   IMPRESSION/PLAN 86 y.o. female POD1 L1 kyphoplasty. Doing well. - d/c home

## 2019-09-06 NOTE — Plan of Care (Signed)
Patient alert and oriented, mae's well, voiding adequate amount of urine, swallowing without difficulty, no c/o pain at time of discharge. Patient discharged home with family. Script and discharged instructions given to patient. Patient and family stated understanding of instructions given. Patient has an appointment with Dr. Cabbell   

## 2019-09-08 DIAGNOSIS — M170 Bilateral primary osteoarthritis of knee: Secondary | ICD-10-CM | POA: Diagnosis not present

## 2019-09-08 DIAGNOSIS — E7849 Other hyperlipidemia: Secondary | ICD-10-CM | POA: Diagnosis not present

## 2019-09-08 DIAGNOSIS — I5031 Acute diastolic (congestive) heart failure: Secondary | ICD-10-CM | POA: Diagnosis not present

## 2019-09-08 NOTE — Anesthesia Postprocedure Evaluation (Signed)
Anesthesia Post Note  Patient: Walterine [place]  Procedure(s) Performed: Lumbar One Kyphoplasty (N/A )     Patient location during evaluation: PACU Anesthesia Type: General Level of consciousness: awake and alert Pain management: pain level controlled Vital Signs Assessment: post-procedure vital signs reviewed and stable Respiratory status: spontaneous breathing, nonlabored ventilation, respiratory function stable and patient connected to nasal cannula oxygen Cardiovascular status: blood pressure returned to baseline and stable Postop Assessment: no apparent nausea or vomiting Anesthetic complications: no    Last Vitals:  Vitals:   09/06/19 0445 09/06/19 0741  BP: (!) 155/61 (!) 107/42  Pulse: 73 64  Resp: 18 18  Temp: 36.7 C 36.5 C  SpO2: 94% 97%    Last Pain:  Vitals:   09/06/19 0741  TempSrc: Oral  PainSc:                  JOSLIN,DAVID COKER

## 2019-09-09 ENCOUNTER — Encounter: Payer: Self-pay | Admitting: *Deleted

## 2019-09-16 DIAGNOSIS — M150 Primary generalized (osteo)arthritis: Secondary | ICD-10-CM | POA: Diagnosis not present

## 2019-09-16 DIAGNOSIS — R768 Other specified abnormal immunological findings in serum: Secondary | ICD-10-CM | POA: Diagnosis not present

## 2019-09-16 DIAGNOSIS — E559 Vitamin D deficiency, unspecified: Secondary | ICD-10-CM | POA: Diagnosis not present

## 2019-09-16 DIAGNOSIS — M8000XD Age-related osteoporosis with current pathological fracture, unspecified site, subsequent encounter for fracture with routine healing: Secondary | ICD-10-CM | POA: Diagnosis not present

## 2019-09-16 DIAGNOSIS — M0600 Rheumatoid arthritis without rheumatoid factor, unspecified site: Secondary | ICD-10-CM | POA: Diagnosis not present

## 2019-09-16 DIAGNOSIS — M79672 Pain in left foot: Secondary | ICD-10-CM | POA: Diagnosis not present

## 2019-09-16 DIAGNOSIS — Z6824 Body mass index (BMI) 24.0-24.9, adult: Secondary | ICD-10-CM | POA: Diagnosis not present

## 2019-09-16 DIAGNOSIS — M7989 Other specified soft tissue disorders: Secondary | ICD-10-CM | POA: Diagnosis not present

## 2019-09-16 DIAGNOSIS — M79671 Pain in right foot: Secondary | ICD-10-CM | POA: Diagnosis not present

## 2019-09-19 DIAGNOSIS — H353211 Exudative age-related macular degeneration, right eye, with active choroidal neovascularization: Secondary | ICD-10-CM | POA: Diagnosis not present

## 2019-09-19 DIAGNOSIS — H353231 Exudative age-related macular degeneration, bilateral, with active choroidal neovascularization: Secondary | ICD-10-CM | POA: Diagnosis not present

## 2019-10-27 DIAGNOSIS — E538 Deficiency of other specified B group vitamins: Secondary | ICD-10-CM | POA: Diagnosis not present

## 2019-10-29 DIAGNOSIS — H401131 Primary open-angle glaucoma, bilateral, mild stage: Secondary | ICD-10-CM | POA: Diagnosis not present

## 2019-10-29 DIAGNOSIS — H353231 Exudative age-related macular degeneration, bilateral, with active choroidal neovascularization: Secondary | ICD-10-CM | POA: Diagnosis not present

## 2019-10-29 DIAGNOSIS — Z961 Presence of intraocular lens: Secondary | ICD-10-CM | POA: Diagnosis not present

## 2019-10-30 DIAGNOSIS — I739 Peripheral vascular disease, unspecified: Secondary | ICD-10-CM | POA: Diagnosis not present

## 2019-11-13 DIAGNOSIS — H353231 Exudative age-related macular degeneration, bilateral, with active choroidal neovascularization: Secondary | ICD-10-CM | POA: Diagnosis not present

## 2019-11-13 DIAGNOSIS — H353211 Exudative age-related macular degeneration, right eye, with active choroidal neovascularization: Secondary | ICD-10-CM | POA: Diagnosis not present

## 2019-11-13 DIAGNOSIS — H43812 Vitreous degeneration, left eye: Secondary | ICD-10-CM | POA: Diagnosis not present

## 2019-11-13 DIAGNOSIS — H35033 Hypertensive retinopathy, bilateral: Secondary | ICD-10-CM | POA: Diagnosis not present

## 2019-11-29 DIAGNOSIS — L03114 Cellulitis of left upper limb: Secondary | ICD-10-CM | POA: Diagnosis not present

## 2019-11-29 DIAGNOSIS — S41152A Open bite of left upper arm, initial encounter: Secondary | ICD-10-CM | POA: Diagnosis not present

## 2019-12-02 DIAGNOSIS — Z1331 Encounter for screening for depression: Secondary | ICD-10-CM | POA: Diagnosis not present

## 2019-12-02 DIAGNOSIS — W5501XD Bitten by cat, subsequent encounter: Secondary | ICD-10-CM | POA: Diagnosis not present

## 2019-12-02 DIAGNOSIS — L03114 Cellulitis of left upper limb: Secondary | ICD-10-CM | POA: Diagnosis not present

## 2019-12-02 DIAGNOSIS — D519 Vitamin B12 deficiency anemia, unspecified: Secondary | ICD-10-CM | POA: Diagnosis not present

## 2019-12-02 DIAGNOSIS — Z1389 Encounter for screening for other disorder: Secondary | ICD-10-CM | POA: Diagnosis not present

## 2019-12-09 DIAGNOSIS — I5031 Acute diastolic (congestive) heart failure: Secondary | ICD-10-CM | POA: Diagnosis not present

## 2019-12-09 DIAGNOSIS — M170 Bilateral primary osteoarthritis of knee: Secondary | ICD-10-CM | POA: Diagnosis not present

## 2019-12-09 DIAGNOSIS — E7849 Other hyperlipidemia: Secondary | ICD-10-CM | POA: Diagnosis not present

## 2019-12-12 ENCOUNTER — Other Ambulatory Visit: Payer: Self-pay

## 2019-12-12 ENCOUNTER — Emergency Department (HOSPITAL_COMMUNITY)
Admission: EM | Admit: 2019-12-12 | Discharge: 2019-12-12 | Disposition: A | Discharge status: Home / Self Care | Payer: Medicare Other | Attending: Emergency Medicine | Admitting: Emergency Medicine

## 2019-12-12 ENCOUNTER — Encounter (HOSPITAL_COMMUNITY): Payer: Self-pay

## 2019-12-12 DIAGNOSIS — Y929 Unspecified place or not applicable: Secondary | ICD-10-CM | POA: Diagnosis not present

## 2019-12-12 DIAGNOSIS — S72009A Fracture of unspecified part of neck of unspecified femur, initial encounter for closed fracture: Secondary | ICD-10-CM | POA: Diagnosis not present

## 2019-12-12 DIAGNOSIS — X58XXXA Exposure to other specified factors, initial encounter: Secondary | ICD-10-CM | POA: Diagnosis not present

## 2019-12-12 DIAGNOSIS — M25551 Pain in right hip: Secondary | ICD-10-CM | POA: Diagnosis not present

## 2019-12-12 DIAGNOSIS — Y999 Unspecified external cause status: Secondary | ICD-10-CM | POA: Diagnosis not present

## 2019-12-12 DIAGNOSIS — Z5321 Procedure and treatment not carried out due to patient leaving prior to being seen by health care provider: Secondary | ICD-10-CM | POA: Insufficient documentation

## 2019-12-12 DIAGNOSIS — Y939 Activity, unspecified: Secondary | ICD-10-CM | POA: Diagnosis not present

## 2019-12-12 NOTE — ED Triage Notes (Signed)
Pt to er, pt states that her md called her and told her that she has a femoral neck fracture, pt ambulatory to triage.  MD Quillian Quince called earlier and said that pt has a femoral neck fracture and we was going to send her to [facility], but they didn't have ortho so she came here.  Pt reports some pain when she puts weight on it.

## 2019-12-15 DIAGNOSIS — Z90710 Acquired absence of both cervix and uterus: Secondary | ICD-10-CM | POA: Diagnosis not present

## 2019-12-15 DIAGNOSIS — W1830XA Fall on same level, unspecified, initial encounter: Secondary | ICD-10-CM | POA: Diagnosis not present

## 2019-12-15 DIAGNOSIS — Z9889 Other specified postprocedural states: Secondary | ICD-10-CM | POA: Diagnosis not present

## 2019-12-15 DIAGNOSIS — S300XXA Contusion of lower back and pelvis, initial encounter: Secondary | ICD-10-CM | POA: Diagnosis not present

## 2019-12-15 DIAGNOSIS — Y92009 Unspecified place in unspecified non-institutional (private) residence as the place of occurrence of the external cause: Secondary | ICD-10-CM | POA: Diagnosis not present

## 2019-12-15 DIAGNOSIS — Y9301 Activity, walking, marching and hiking: Secondary | ICD-10-CM | POA: Diagnosis not present

## 2019-12-15 DIAGNOSIS — W19XXXA Unspecified fall, initial encounter: Secondary | ICD-10-CM | POA: Diagnosis not present

## 2019-12-15 DIAGNOSIS — Z96649 Presence of unspecified artificial hip joint: Secondary | ICD-10-CM | POA: Diagnosis not present

## 2019-12-21 DIAGNOSIS — Z7901 Long term (current) use of anticoagulants: Secondary | ICD-10-CM | POA: Diagnosis not present

## 2019-12-21 DIAGNOSIS — Z96642 Presence of left artificial hip joint: Secondary | ICD-10-CM | POA: Diagnosis not present

## 2019-12-21 DIAGNOSIS — Z9181 History of falling: Secondary | ICD-10-CM | POA: Diagnosis not present

## 2019-12-21 DIAGNOSIS — M1611 Unilateral primary osteoarthritis, right hip: Secondary | ICD-10-CM | POA: Diagnosis not present

## 2019-12-21 DIAGNOSIS — S72001D Fracture of unspecified part of neck of right femur, subsequent encounter for closed fracture with routine healing: Secondary | ICD-10-CM | POA: Diagnosis not present

## 2019-12-21 DIAGNOSIS — M479 Spondylosis, unspecified: Secondary | ICD-10-CM | POA: Diagnosis not present

## 2019-12-21 DIAGNOSIS — M545 Low back pain: Secondary | ICD-10-CM | POA: Diagnosis not present

## 2019-12-21 DIAGNOSIS — F419 Anxiety disorder, unspecified: Secondary | ICD-10-CM | POA: Diagnosis not present

## 2019-12-25 DIAGNOSIS — S72001D Fracture of unspecified part of neck of right femur, subsequent encounter for closed fracture with routine healing: Secondary | ICD-10-CM | POA: Diagnosis not present

## 2019-12-25 DIAGNOSIS — M479 Spondylosis, unspecified: Secondary | ICD-10-CM | POA: Diagnosis not present

## 2019-12-25 DIAGNOSIS — Z9181 History of falling: Secondary | ICD-10-CM | POA: Diagnosis not present

## 2019-12-25 DIAGNOSIS — F419 Anxiety disorder, unspecified: Secondary | ICD-10-CM | POA: Diagnosis not present

## 2019-12-25 DIAGNOSIS — M1611 Unilateral primary osteoarthritis, right hip: Secondary | ICD-10-CM | POA: Diagnosis not present

## 2019-12-25 DIAGNOSIS — M545 Low back pain: Secondary | ICD-10-CM | POA: Diagnosis not present

## 2019-12-30 DIAGNOSIS — M479 Spondylosis, unspecified: Secondary | ICD-10-CM | POA: Diagnosis not present

## 2019-12-30 DIAGNOSIS — F419 Anxiety disorder, unspecified: Secondary | ICD-10-CM | POA: Diagnosis not present

## 2019-12-30 DIAGNOSIS — S72001D Fracture of unspecified part of neck of right femur, subsequent encounter for closed fracture with routine healing: Secondary | ICD-10-CM | POA: Diagnosis not present

## 2019-12-30 DIAGNOSIS — Z9181 History of falling: Secondary | ICD-10-CM | POA: Diagnosis not present

## 2019-12-30 DIAGNOSIS — M1611 Unilateral primary osteoarthritis, right hip: Secondary | ICD-10-CM | POA: Diagnosis not present

## 2019-12-30 DIAGNOSIS — M545 Low back pain: Secondary | ICD-10-CM | POA: Diagnosis not present

## 2020-01-02 DIAGNOSIS — F419 Anxiety disorder, unspecified: Secondary | ICD-10-CM | POA: Diagnosis not present

## 2020-01-02 DIAGNOSIS — M545 Low back pain: Secondary | ICD-10-CM | POA: Diagnosis not present

## 2020-01-02 DIAGNOSIS — M479 Spondylosis, unspecified: Secondary | ICD-10-CM | POA: Diagnosis not present

## 2020-01-02 DIAGNOSIS — M1611 Unilateral primary osteoarthritis, right hip: Secondary | ICD-10-CM | POA: Diagnosis not present

## 2020-01-02 DIAGNOSIS — Z9181 History of falling: Secondary | ICD-10-CM | POA: Diagnosis not present

## 2020-01-02 DIAGNOSIS — S72001D Fracture of unspecified part of neck of right femur, subsequent encounter for closed fracture with routine healing: Secondary | ICD-10-CM | POA: Diagnosis not present

## 2020-01-02 DIAGNOSIS — E538 Deficiency of other specified B group vitamins: Secondary | ICD-10-CM | POA: Diagnosis not present

## 2020-01-06 DIAGNOSIS — Z9181 History of falling: Secondary | ICD-10-CM | POA: Diagnosis not present

## 2020-01-06 DIAGNOSIS — M545 Low back pain: Secondary | ICD-10-CM | POA: Diagnosis not present

## 2020-01-06 DIAGNOSIS — S72001D Fracture of unspecified part of neck of right femur, subsequent encounter for closed fracture with routine healing: Secondary | ICD-10-CM | POA: Diagnosis not present

## 2020-01-06 DIAGNOSIS — M1611 Unilateral primary osteoarthritis, right hip: Secondary | ICD-10-CM | POA: Diagnosis not present

## 2020-01-06 DIAGNOSIS — F419 Anxiety disorder, unspecified: Secondary | ICD-10-CM | POA: Diagnosis not present

## 2020-01-06 DIAGNOSIS — M479 Spondylosis, unspecified: Secondary | ICD-10-CM | POA: Diagnosis not present

## 2020-01-08 DIAGNOSIS — M170 Bilateral primary osteoarthritis of knee: Secondary | ICD-10-CM | POA: Diagnosis not present

## 2020-01-08 DIAGNOSIS — I5031 Acute diastolic (congestive) heart failure: Secondary | ICD-10-CM | POA: Diagnosis not present

## 2020-01-08 DIAGNOSIS — E7849 Other hyperlipidemia: Secondary | ICD-10-CM | POA: Diagnosis not present

## 2020-01-08 DIAGNOSIS — H353231 Exudative age-related macular degeneration, bilateral, with active choroidal neovascularization: Secondary | ICD-10-CM | POA: Diagnosis not present

## 2020-01-09 DIAGNOSIS — M1611 Unilateral primary osteoarthritis, right hip: Secondary | ICD-10-CM | POA: Diagnosis not present

## 2020-01-09 DIAGNOSIS — S72001D Fracture of unspecified part of neck of right femur, subsequent encounter for closed fracture with routine healing: Secondary | ICD-10-CM | POA: Diagnosis not present

## 2020-01-09 DIAGNOSIS — Z9181 History of falling: Secondary | ICD-10-CM | POA: Diagnosis not present

## 2020-01-09 DIAGNOSIS — F419 Anxiety disorder, unspecified: Secondary | ICD-10-CM | POA: Diagnosis not present

## 2020-01-09 DIAGNOSIS — M479 Spondylosis, unspecified: Secondary | ICD-10-CM | POA: Diagnosis not present

## 2020-01-09 DIAGNOSIS — M545 Low back pain: Secondary | ICD-10-CM | POA: Diagnosis not present

## 2020-01-15 DIAGNOSIS — I739 Peripheral vascular disease, unspecified: Secondary | ICD-10-CM | POA: Diagnosis not present

## 2020-01-16 DIAGNOSIS — S72001D Fracture of unspecified part of neck of right femur, subsequent encounter for closed fracture with routine healing: Secondary | ICD-10-CM | POA: Diagnosis not present

## 2020-01-16 DIAGNOSIS — F419 Anxiety disorder, unspecified: Secondary | ICD-10-CM | POA: Diagnosis not present

## 2020-01-16 DIAGNOSIS — Z9181 History of falling: Secondary | ICD-10-CM | POA: Diagnosis not present

## 2020-01-16 DIAGNOSIS — M545 Low back pain: Secondary | ICD-10-CM | POA: Diagnosis not present

## 2020-01-16 DIAGNOSIS — M1611 Unilateral primary osteoarthritis, right hip: Secondary | ICD-10-CM | POA: Diagnosis not present

## 2020-01-16 DIAGNOSIS — M479 Spondylosis, unspecified: Secondary | ICD-10-CM | POA: Diagnosis not present

## 2020-01-19 DIAGNOSIS — Z9181 History of falling: Secondary | ICD-10-CM | POA: Diagnosis not present

## 2020-01-19 DIAGNOSIS — M479 Spondylosis, unspecified: Secondary | ICD-10-CM | POA: Diagnosis not present

## 2020-01-19 DIAGNOSIS — M1611 Unilateral primary osteoarthritis, right hip: Secondary | ICD-10-CM | POA: Diagnosis not present

## 2020-01-19 DIAGNOSIS — Z96642 Presence of left artificial hip joint: Secondary | ICD-10-CM | POA: Diagnosis not present

## 2020-01-19 DIAGNOSIS — M545 Low back pain, unspecified: Secondary | ICD-10-CM | POA: Diagnosis not present

## 2020-01-19 DIAGNOSIS — Z7901 Long term (current) use of anticoagulants: Secondary | ICD-10-CM | POA: Diagnosis not present

## 2020-01-19 DIAGNOSIS — S72001D Fracture of unspecified part of neck of right femur, subsequent encounter for closed fracture with routine healing: Secondary | ICD-10-CM | POA: Diagnosis not present

## 2020-01-19 DIAGNOSIS — F419 Anxiety disorder, unspecified: Secondary | ICD-10-CM | POA: Diagnosis not present

## 2020-01-20 DIAGNOSIS — M150 Primary generalized (osteo)arthritis: Secondary | ICD-10-CM | POA: Diagnosis not present

## 2020-01-20 DIAGNOSIS — F419 Anxiety disorder, unspecified: Secondary | ICD-10-CM | POA: Diagnosis not present

## 2020-01-20 DIAGNOSIS — M7989 Other specified soft tissue disorders: Secondary | ICD-10-CM | POA: Diagnosis not present

## 2020-01-20 DIAGNOSIS — Z6825 Body mass index (BMI) 25.0-25.9, adult: Secondary | ICD-10-CM | POA: Diagnosis not present

## 2020-01-20 DIAGNOSIS — M479 Spondylosis, unspecified: Secondary | ICD-10-CM | POA: Diagnosis not present

## 2020-01-20 DIAGNOSIS — M79671 Pain in right foot: Secondary | ICD-10-CM | POA: Diagnosis not present

## 2020-01-20 DIAGNOSIS — M79672 Pain in left foot: Secondary | ICD-10-CM | POA: Diagnosis not present

## 2020-01-20 DIAGNOSIS — Z9181 History of falling: Secondary | ICD-10-CM | POA: Diagnosis not present

## 2020-01-20 DIAGNOSIS — M8000XD Age-related osteoporosis with current pathological fracture, unspecified site, subsequent encounter for fracture with routine healing: Secondary | ICD-10-CM | POA: Diagnosis not present

## 2020-01-20 DIAGNOSIS — Z96642 Presence of left artificial hip joint: Secondary | ICD-10-CM | POA: Diagnosis not present

## 2020-01-20 DIAGNOSIS — M0600 Rheumatoid arthritis without rheumatoid factor, unspecified site: Secondary | ICD-10-CM | POA: Diagnosis not present

## 2020-01-20 DIAGNOSIS — Z7901 Long term (current) use of anticoagulants: Secondary | ICD-10-CM | POA: Diagnosis not present

## 2020-01-20 DIAGNOSIS — E663 Overweight: Secondary | ICD-10-CM | POA: Diagnosis not present

## 2020-01-20 DIAGNOSIS — M1611 Unilateral primary osteoarthritis, right hip: Secondary | ICD-10-CM | POA: Diagnosis not present

## 2020-01-20 DIAGNOSIS — R768 Other specified abnormal immunological findings in serum: Secondary | ICD-10-CM | POA: Diagnosis not present

## 2020-01-20 DIAGNOSIS — M545 Low back pain, unspecified: Secondary | ICD-10-CM | POA: Diagnosis not present

## 2020-01-20 DIAGNOSIS — S72001D Fracture of unspecified part of neck of right femur, subsequent encounter for closed fracture with routine healing: Secondary | ICD-10-CM | POA: Diagnosis not present

## 2020-01-26 DIAGNOSIS — M479 Spondylosis, unspecified: Secondary | ICD-10-CM | POA: Diagnosis not present

## 2020-01-26 DIAGNOSIS — M545 Low back pain, unspecified: Secondary | ICD-10-CM | POA: Diagnosis not present

## 2020-01-26 DIAGNOSIS — Z9181 History of falling: Secondary | ICD-10-CM | POA: Diagnosis not present

## 2020-01-26 DIAGNOSIS — M1611 Unilateral primary osteoarthritis, right hip: Secondary | ICD-10-CM | POA: Diagnosis not present

## 2020-01-26 DIAGNOSIS — F419 Anxiety disorder, unspecified: Secondary | ICD-10-CM | POA: Diagnosis not present

## 2020-01-26 DIAGNOSIS — S72001D Fracture of unspecified part of neck of right femur, subsequent encounter for closed fracture with routine healing: Secondary | ICD-10-CM | POA: Diagnosis not present

## 2020-01-27 DIAGNOSIS — M8000XD Age-related osteoporosis with current pathological fracture, unspecified site, subsequent encounter for fracture with routine healing: Secondary | ICD-10-CM | POA: Diagnosis not present

## 2020-02-02 DIAGNOSIS — E538 Deficiency of other specified B group vitamins: Secondary | ICD-10-CM | POA: Diagnosis not present

## 2020-02-06 DIAGNOSIS — F419 Anxiety disorder, unspecified: Secondary | ICD-10-CM | POA: Diagnosis not present

## 2020-02-06 DIAGNOSIS — S72001D Fracture of unspecified part of neck of right femur, subsequent encounter for closed fracture with routine healing: Secondary | ICD-10-CM | POA: Diagnosis not present

## 2020-02-06 DIAGNOSIS — M1611 Unilateral primary osteoarthritis, right hip: Secondary | ICD-10-CM | POA: Diagnosis not present

## 2020-02-06 DIAGNOSIS — M545 Low back pain, unspecified: Secondary | ICD-10-CM | POA: Diagnosis not present

## 2020-02-06 DIAGNOSIS — M479 Spondylosis, unspecified: Secondary | ICD-10-CM | POA: Diagnosis not present

## 2020-02-06 DIAGNOSIS — Z9181 History of falling: Secondary | ICD-10-CM | POA: Diagnosis not present

## 2020-02-07 DIAGNOSIS — E7849 Other hyperlipidemia: Secondary | ICD-10-CM | POA: Diagnosis not present

## 2020-02-07 DIAGNOSIS — M170 Bilateral primary osteoarthritis of knee: Secondary | ICD-10-CM | POA: Diagnosis not present

## 2020-02-07 DIAGNOSIS — I5031 Acute diastolic (congestive) heart failure: Secondary | ICD-10-CM | POA: Diagnosis not present

## 2020-02-12 DIAGNOSIS — I82509 Chronic embolism and thrombosis of unspecified deep veins of unspecified lower extremity: Secondary | ICD-10-CM | POA: Diagnosis not present

## 2020-02-12 DIAGNOSIS — D519 Vitamin B12 deficiency anemia, unspecified: Secondary | ICD-10-CM | POA: Diagnosis not present

## 2020-02-12 DIAGNOSIS — G629 Polyneuropathy, unspecified: Secondary | ICD-10-CM | POA: Diagnosis not present

## 2020-02-12 DIAGNOSIS — Z86718 Personal history of other venous thrombosis and embolism: Secondary | ICD-10-CM | POA: Diagnosis not present

## 2020-02-12 DIAGNOSIS — Z79899 Other long term (current) drug therapy: Secondary | ICD-10-CM | POA: Diagnosis not present

## 2020-02-12 DIAGNOSIS — W1830XD Fall on same level, unspecified, subsequent encounter: Secondary | ICD-10-CM | POA: Diagnosis not present

## 2020-02-12 DIAGNOSIS — R2681 Unsteadiness on feet: Secondary | ICD-10-CM | POA: Diagnosis not present

## 2020-02-12 DIAGNOSIS — H353290 Exudative age-related macular degeneration, unspecified eye, stage unspecified: Secondary | ICD-10-CM | POA: Diagnosis not present

## 2020-02-12 DIAGNOSIS — M0600 Rheumatoid arthritis without rheumatoid factor, unspecified site: Secondary | ICD-10-CM | POA: Diagnosis not present

## 2020-02-13 DIAGNOSIS — M1611 Unilateral primary osteoarthritis, right hip: Secondary | ICD-10-CM | POA: Diagnosis not present

## 2020-02-13 DIAGNOSIS — F419 Anxiety disorder, unspecified: Secondary | ICD-10-CM | POA: Diagnosis not present

## 2020-02-13 DIAGNOSIS — M545 Low back pain, unspecified: Secondary | ICD-10-CM | POA: Diagnosis not present

## 2020-02-13 DIAGNOSIS — S72001D Fracture of unspecified part of neck of right femur, subsequent encounter for closed fracture with routine healing: Secondary | ICD-10-CM | POA: Diagnosis not present

## 2020-02-13 DIAGNOSIS — M479 Spondylosis, unspecified: Secondary | ICD-10-CM | POA: Diagnosis not present

## 2020-02-13 DIAGNOSIS — Z9181 History of falling: Secondary | ICD-10-CM | POA: Diagnosis not present

## 2020-02-16 DIAGNOSIS — Z9181 History of falling: Secondary | ICD-10-CM | POA: Diagnosis not present

## 2020-02-16 DIAGNOSIS — M479 Spondylosis, unspecified: Secondary | ICD-10-CM | POA: Diagnosis not present

## 2020-02-16 DIAGNOSIS — M1611 Unilateral primary osteoarthritis, right hip: Secondary | ICD-10-CM | POA: Diagnosis not present

## 2020-02-16 DIAGNOSIS — F419 Anxiety disorder, unspecified: Secondary | ICD-10-CM | POA: Diagnosis not present

## 2020-02-16 DIAGNOSIS — S72001D Fracture of unspecified part of neck of right femur, subsequent encounter for closed fracture with routine healing: Secondary | ICD-10-CM | POA: Diagnosis not present

## 2020-02-16 DIAGNOSIS — M545 Low back pain, unspecified: Secondary | ICD-10-CM | POA: Diagnosis not present

## 2020-02-17 DIAGNOSIS — Z23 Encounter for immunization: Secondary | ICD-10-CM | POA: Diagnosis not present

## 2020-02-18 DIAGNOSIS — Z23 Encounter for immunization: Secondary | ICD-10-CM | POA: Diagnosis not present

## 2020-02-19 DIAGNOSIS — S72001D Fracture of unspecified part of neck of right femur, subsequent encounter for closed fracture with routine healing: Secondary | ICD-10-CM | POA: Diagnosis not present

## 2020-02-19 DIAGNOSIS — F419 Anxiety disorder, unspecified: Secondary | ICD-10-CM | POA: Diagnosis not present

## 2020-02-19 DIAGNOSIS — M479 Spondylosis, unspecified: Secondary | ICD-10-CM | POA: Diagnosis not present

## 2020-02-19 DIAGNOSIS — M545 Low back pain, unspecified: Secondary | ICD-10-CM | POA: Diagnosis not present

## 2020-02-19 DIAGNOSIS — Z7901 Long term (current) use of anticoagulants: Secondary | ICD-10-CM | POA: Diagnosis not present

## 2020-02-19 DIAGNOSIS — Z9181 History of falling: Secondary | ICD-10-CM | POA: Diagnosis not present

## 2020-02-19 DIAGNOSIS — M1611 Unilateral primary osteoarthritis, right hip: Secondary | ICD-10-CM | POA: Diagnosis not present

## 2020-02-19 DIAGNOSIS — Z96642 Presence of left artificial hip joint: Secondary | ICD-10-CM | POA: Diagnosis not present

## 2020-02-20 DIAGNOSIS — S72001D Fracture of unspecified part of neck of right femur, subsequent encounter for closed fracture with routine healing: Secondary | ICD-10-CM | POA: Diagnosis not present

## 2020-02-20 DIAGNOSIS — Z9181 History of falling: Secondary | ICD-10-CM | POA: Diagnosis not present

## 2020-02-20 DIAGNOSIS — M1611 Unilateral primary osteoarthritis, right hip: Secondary | ICD-10-CM | POA: Diagnosis not present

## 2020-02-20 DIAGNOSIS — M545 Low back pain, unspecified: Secondary | ICD-10-CM | POA: Diagnosis not present

## 2020-02-20 DIAGNOSIS — F419 Anxiety disorder, unspecified: Secondary | ICD-10-CM | POA: Diagnosis not present

## 2020-02-20 DIAGNOSIS — M479 Spondylosis, unspecified: Secondary | ICD-10-CM | POA: Diagnosis not present

## 2020-02-27 DIAGNOSIS — M479 Spondylosis, unspecified: Secondary | ICD-10-CM | POA: Diagnosis not present

## 2020-02-27 DIAGNOSIS — Z9181 History of falling: Secondary | ICD-10-CM | POA: Diagnosis not present

## 2020-02-27 DIAGNOSIS — M545 Low back pain, unspecified: Secondary | ICD-10-CM | POA: Diagnosis not present

## 2020-02-27 DIAGNOSIS — S72001D Fracture of unspecified part of neck of right femur, subsequent encounter for closed fracture with routine healing: Secondary | ICD-10-CM | POA: Diagnosis not present

## 2020-02-27 DIAGNOSIS — M1611 Unilateral primary osteoarthritis, right hip: Secondary | ICD-10-CM | POA: Diagnosis not present

## 2020-02-27 DIAGNOSIS — F419 Anxiety disorder, unspecified: Secondary | ICD-10-CM | POA: Diagnosis not present

## 2020-03-01 DIAGNOSIS — H43812 Vitreous degeneration, left eye: Secondary | ICD-10-CM | POA: Diagnosis not present

## 2020-03-01 DIAGNOSIS — H35033 Hypertensive retinopathy, bilateral: Secondary | ICD-10-CM | POA: Diagnosis not present

## 2020-03-01 DIAGNOSIS — H353211 Exudative age-related macular degeneration, right eye, with active choroidal neovascularization: Secondary | ICD-10-CM | POA: Diagnosis not present

## 2020-03-01 DIAGNOSIS — H33321 Round hole, right eye: Secondary | ICD-10-CM | POA: Diagnosis not present

## 2020-03-01 DIAGNOSIS — H353231 Exudative age-related macular degeneration, bilateral, with active choroidal neovascularization: Secondary | ICD-10-CM | POA: Diagnosis not present

## 2020-03-05 DIAGNOSIS — Z9181 History of falling: Secondary | ICD-10-CM | POA: Diagnosis not present

## 2020-03-05 DIAGNOSIS — M479 Spondylosis, unspecified: Secondary | ICD-10-CM | POA: Diagnosis not present

## 2020-03-05 DIAGNOSIS — S72001D Fracture of unspecified part of neck of right femur, subsequent encounter for closed fracture with routine healing: Secondary | ICD-10-CM | POA: Diagnosis not present

## 2020-03-05 DIAGNOSIS — M1611 Unilateral primary osteoarthritis, right hip: Secondary | ICD-10-CM | POA: Diagnosis not present

## 2020-03-05 DIAGNOSIS — F419 Anxiety disorder, unspecified: Secondary | ICD-10-CM | POA: Diagnosis not present

## 2020-03-05 DIAGNOSIS — M545 Low back pain, unspecified: Secondary | ICD-10-CM | POA: Diagnosis not present

## 2020-03-12 DIAGNOSIS — F419 Anxiety disorder, unspecified: Secondary | ICD-10-CM | POA: Diagnosis not present

## 2020-03-12 DIAGNOSIS — M479 Spondylosis, unspecified: Secondary | ICD-10-CM | POA: Diagnosis not present

## 2020-03-12 DIAGNOSIS — Z9181 History of falling: Secondary | ICD-10-CM | POA: Diagnosis not present

## 2020-03-12 DIAGNOSIS — S72001D Fracture of unspecified part of neck of right femur, subsequent encounter for closed fracture with routine healing: Secondary | ICD-10-CM | POA: Diagnosis not present

## 2020-03-12 DIAGNOSIS — M1611 Unilateral primary osteoarthritis, right hip: Secondary | ICD-10-CM | POA: Diagnosis not present

## 2020-03-12 DIAGNOSIS — M545 Low back pain, unspecified: Secondary | ICD-10-CM | POA: Diagnosis not present

## 2020-03-16 DIAGNOSIS — M545 Low back pain, unspecified: Secondary | ICD-10-CM | POA: Diagnosis not present

## 2020-03-16 DIAGNOSIS — S72001D Fracture of unspecified part of neck of right femur, subsequent encounter for closed fracture with routine healing: Secondary | ICD-10-CM | POA: Diagnosis not present

## 2020-03-16 DIAGNOSIS — M1611 Unilateral primary osteoarthritis, right hip: Secondary | ICD-10-CM | POA: Diagnosis not present

## 2020-03-16 DIAGNOSIS — Z9181 History of falling: Secondary | ICD-10-CM | POA: Diagnosis not present

## 2020-03-16 DIAGNOSIS — F419 Anxiety disorder, unspecified: Secondary | ICD-10-CM | POA: Diagnosis not present

## 2020-03-16 DIAGNOSIS — M479 Spondylosis, unspecified: Secondary | ICD-10-CM | POA: Diagnosis not present

## 2020-03-20 DIAGNOSIS — Z96642 Presence of left artificial hip joint: Secondary | ICD-10-CM | POA: Diagnosis not present

## 2020-03-20 DIAGNOSIS — Z7901 Long term (current) use of anticoagulants: Secondary | ICD-10-CM | POA: Diagnosis not present

## 2020-03-20 DIAGNOSIS — Z9181 History of falling: Secondary | ICD-10-CM | POA: Diagnosis not present

## 2020-03-20 DIAGNOSIS — F419 Anxiety disorder, unspecified: Secondary | ICD-10-CM | POA: Diagnosis not present

## 2020-03-20 DIAGNOSIS — M545 Low back pain, unspecified: Secondary | ICD-10-CM | POA: Diagnosis not present

## 2020-03-20 DIAGNOSIS — M1611 Unilateral primary osteoarthritis, right hip: Secondary | ICD-10-CM | POA: Diagnosis not present

## 2020-03-20 DIAGNOSIS — M479 Spondylosis, unspecified: Secondary | ICD-10-CM | POA: Diagnosis not present

## 2020-03-20 DIAGNOSIS — S72001D Fracture of unspecified part of neck of right femur, subsequent encounter for closed fracture with routine healing: Secondary | ICD-10-CM | POA: Diagnosis not present

## 2020-03-23 DIAGNOSIS — M79672 Pain in left foot: Secondary | ICD-10-CM | POA: Diagnosis not present

## 2020-03-23 DIAGNOSIS — M25562 Pain in left knee: Secondary | ICD-10-CM | POA: Diagnosis not present

## 2020-03-23 DIAGNOSIS — M150 Primary generalized (osteo)arthritis: Secondary | ICD-10-CM | POA: Diagnosis not present

## 2020-03-23 DIAGNOSIS — M7989 Other specified soft tissue disorders: Secondary | ICD-10-CM | POA: Diagnosis not present

## 2020-03-23 DIAGNOSIS — E663 Overweight: Secondary | ICD-10-CM | POA: Diagnosis not present

## 2020-03-23 DIAGNOSIS — M79671 Pain in right foot: Secondary | ICD-10-CM | POA: Diagnosis not present

## 2020-03-23 DIAGNOSIS — Z79899 Other long term (current) drug therapy: Secondary | ICD-10-CM | POA: Diagnosis not present

## 2020-03-23 DIAGNOSIS — M8000XD Age-related osteoporosis with current pathological fracture, unspecified site, subsequent encounter for fracture with routine healing: Secondary | ICD-10-CM | POA: Diagnosis not present

## 2020-03-23 DIAGNOSIS — M0600 Rheumatoid arthritis without rheumatoid factor, unspecified site: Secondary | ICD-10-CM | POA: Diagnosis not present

## 2020-03-23 DIAGNOSIS — R768 Other specified abnormal immunological findings in serum: Secondary | ICD-10-CM | POA: Diagnosis not present

## 2020-03-23 DIAGNOSIS — Z6825 Body mass index (BMI) 25.0-25.9, adult: Secondary | ICD-10-CM | POA: Diagnosis not present

## 2020-03-25 DIAGNOSIS — I739 Peripheral vascular disease, unspecified: Secondary | ICD-10-CM | POA: Diagnosis not present

## 2020-04-01 DIAGNOSIS — S72001D Fracture of unspecified part of neck of right femur, subsequent encounter for closed fracture with routine healing: Secondary | ICD-10-CM | POA: Diagnosis not present

## 2020-04-01 DIAGNOSIS — M479 Spondylosis, unspecified: Secondary | ICD-10-CM | POA: Diagnosis not present

## 2020-04-01 DIAGNOSIS — F419 Anxiety disorder, unspecified: Secondary | ICD-10-CM | POA: Diagnosis not present

## 2020-04-01 DIAGNOSIS — Z9181 History of falling: Secondary | ICD-10-CM | POA: Diagnosis not present

## 2020-04-01 DIAGNOSIS — M1611 Unilateral primary osteoarthritis, right hip: Secondary | ICD-10-CM | POA: Diagnosis not present

## 2020-04-01 DIAGNOSIS — M545 Low back pain, unspecified: Secondary | ICD-10-CM | POA: Diagnosis not present

## 2020-04-15 DIAGNOSIS — M479 Spondylosis, unspecified: Secondary | ICD-10-CM | POA: Diagnosis not present

## 2020-04-15 DIAGNOSIS — M1611 Unilateral primary osteoarthritis, right hip: Secondary | ICD-10-CM | POA: Diagnosis not present

## 2020-04-15 DIAGNOSIS — M545 Low back pain, unspecified: Secondary | ICD-10-CM | POA: Diagnosis not present

## 2020-04-15 DIAGNOSIS — S72001D Fracture of unspecified part of neck of right femur, subsequent encounter for closed fracture with routine healing: Secondary | ICD-10-CM | POA: Diagnosis not present

## 2020-04-15 DIAGNOSIS — Z9181 History of falling: Secondary | ICD-10-CM | POA: Diagnosis not present

## 2020-04-15 DIAGNOSIS — F419 Anxiety disorder, unspecified: Secondary | ICD-10-CM | POA: Diagnosis not present

## 2020-04-29 DIAGNOSIS — H35423 Microcystoid degeneration of retina, bilateral: Secondary | ICD-10-CM | POA: Diagnosis not present

## 2020-04-29 DIAGNOSIS — H353231 Exudative age-related macular degeneration, bilateral, with active choroidal neovascularization: Secondary | ICD-10-CM | POA: Diagnosis not present

## 2020-04-29 DIAGNOSIS — H35033 Hypertensive retinopathy, bilateral: Secondary | ICD-10-CM | POA: Diagnosis not present

## 2020-04-29 DIAGNOSIS — H43813 Vitreous degeneration, bilateral: Secondary | ICD-10-CM | POA: Diagnosis not present

## 2020-05-04 DIAGNOSIS — H401131 Primary open-angle glaucoma, bilateral, mild stage: Secondary | ICD-10-CM | POA: Diagnosis not present

## 2020-05-04 DIAGNOSIS — Z961 Presence of intraocular lens: Secondary | ICD-10-CM | POA: Diagnosis not present

## 2020-05-04 DIAGNOSIS — H16223 Keratoconjunctivitis sicca, not specified as Sjogren's, bilateral: Secondary | ICD-10-CM | POA: Diagnosis not present

## 2020-05-04 DIAGNOSIS — H353231 Exudative age-related macular degeneration, bilateral, with active choroidal neovascularization: Secondary | ICD-10-CM | POA: Diagnosis not present

## 2020-06-03 DIAGNOSIS — R2681 Unsteadiness on feet: Secondary | ICD-10-CM | POA: Diagnosis not present

## 2020-06-03 DIAGNOSIS — I82509 Chronic embolism and thrombosis of unspecified deep veins of unspecified lower extremity: Secondary | ICD-10-CM | POA: Diagnosis not present

## 2020-06-03 DIAGNOSIS — G629 Polyneuropathy, unspecified: Secondary | ICD-10-CM | POA: Diagnosis not present

## 2020-06-03 DIAGNOSIS — D519 Vitamin B12 deficiency anemia, unspecified: Secondary | ICD-10-CM | POA: Diagnosis not present

## 2020-06-03 DIAGNOSIS — Z86718 Personal history of other venous thrombosis and embolism: Secondary | ICD-10-CM | POA: Diagnosis not present

## 2020-06-03 DIAGNOSIS — L989 Disorder of the skin and subcutaneous tissue, unspecified: Secondary | ICD-10-CM | POA: Diagnosis not present

## 2020-06-03 DIAGNOSIS — W1830XD Fall on same level, unspecified, subsequent encounter: Secondary | ICD-10-CM | POA: Diagnosis not present

## 2020-06-03 DIAGNOSIS — M0600 Rheumatoid arthritis without rheumatoid factor, unspecified site: Secondary | ICD-10-CM | POA: Diagnosis not present

## 2020-06-09 DIAGNOSIS — K6289 Other specified diseases of anus and rectum: Secondary | ICD-10-CM

## 2020-06-09 HISTORY — DX: Other specified diseases of anus and rectum: K62.89

## 2020-06-17 DIAGNOSIS — I739 Peripheral vascular disease, unspecified: Secondary | ICD-10-CM | POA: Diagnosis not present

## 2020-06-22 DIAGNOSIS — M25472 Effusion, left ankle: Secondary | ICD-10-CM | POA: Diagnosis not present

## 2020-06-22 DIAGNOSIS — R768 Other specified abnormal immunological findings in serum: Secondary | ICD-10-CM | POA: Diagnosis not present

## 2020-06-22 DIAGNOSIS — M0600 Rheumatoid arthritis without rheumatoid factor, unspecified site: Secondary | ICD-10-CM | POA: Diagnosis not present

## 2020-06-22 DIAGNOSIS — M79672 Pain in left foot: Secondary | ICD-10-CM | POA: Diagnosis not present

## 2020-06-22 DIAGNOSIS — M7989 Other specified soft tissue disorders: Secondary | ICD-10-CM | POA: Diagnosis not present

## 2020-06-22 DIAGNOSIS — M8000XD Age-related osteoporosis with current pathological fracture, unspecified site, subsequent encounter for fracture with routine healing: Secondary | ICD-10-CM | POA: Diagnosis not present

## 2020-06-22 DIAGNOSIS — M150 Primary generalized (osteo)arthritis: Secondary | ICD-10-CM | POA: Diagnosis not present

## 2020-06-22 DIAGNOSIS — M79671 Pain in right foot: Secondary | ICD-10-CM | POA: Diagnosis not present

## 2020-06-22 DIAGNOSIS — M25562 Pain in left knee: Secondary | ICD-10-CM | POA: Diagnosis not present

## 2020-06-22 DIAGNOSIS — Z6826 Body mass index (BMI) 26.0-26.9, adult: Secondary | ICD-10-CM | POA: Diagnosis not present

## 2020-06-22 DIAGNOSIS — E663 Overweight: Secondary | ICD-10-CM | POA: Diagnosis not present

## 2020-06-24 DIAGNOSIS — H353211 Exudative age-related macular degeneration, right eye, with active choroidal neovascularization: Secondary | ICD-10-CM | POA: Diagnosis not present

## 2020-06-24 DIAGNOSIS — H353231 Exudative age-related macular degeneration, bilateral, with active choroidal neovascularization: Secondary | ICD-10-CM | POA: Diagnosis not present

## 2020-07-08 DIAGNOSIS — Z85828 Personal history of other malignant neoplasm of skin: Secondary | ICD-10-CM | POA: Diagnosis not present

## 2020-07-08 DIAGNOSIS — L821 Other seborrheic keratosis: Secondary | ICD-10-CM | POA: Diagnosis not present

## 2020-07-08 DIAGNOSIS — C44319 Basal cell carcinoma of skin of other parts of face: Secondary | ICD-10-CM | POA: Diagnosis not present

## 2020-07-08 DIAGNOSIS — L570 Actinic keratosis: Secondary | ICD-10-CM | POA: Diagnosis not present

## 2020-07-29 DIAGNOSIS — R238 Other skin changes: Secondary | ICD-10-CM | POA: Diagnosis not present

## 2020-07-29 DIAGNOSIS — L304 Erythema intertrigo: Secondary | ICD-10-CM | POA: Diagnosis not present

## 2020-07-29 DIAGNOSIS — M8000XD Age-related osteoporosis with current pathological fracture, unspecified site, subsequent encounter for fracture with routine healing: Secondary | ICD-10-CM | POA: Diagnosis not present

## 2020-08-03 DIAGNOSIS — H401131 Primary open-angle glaucoma, bilateral, mild stage: Secondary | ICD-10-CM | POA: Diagnosis not present

## 2020-08-03 DIAGNOSIS — Z961 Presence of intraocular lens: Secondary | ICD-10-CM | POA: Diagnosis not present

## 2020-08-03 DIAGNOSIS — H353231 Exudative age-related macular degeneration, bilateral, with active choroidal neovascularization: Secondary | ICD-10-CM | POA: Diagnosis not present

## 2020-08-20 DIAGNOSIS — R0689 Other abnormalities of breathing: Secondary | ICD-10-CM | POA: Diagnosis not present

## 2020-08-20 DIAGNOSIS — J3489 Other specified disorders of nose and nasal sinuses: Secondary | ICD-10-CM | POA: Diagnosis not present

## 2020-08-20 DIAGNOSIS — R55 Syncope and collapse: Secondary | ICD-10-CM | POA: Diagnosis not present

## 2020-08-20 DIAGNOSIS — Z7901 Long term (current) use of anticoagulants: Secondary | ICD-10-CM | POA: Diagnosis not present

## 2020-08-20 DIAGNOSIS — I6389 Other cerebral infarction: Secondary | ICD-10-CM | POA: Diagnosis not present

## 2020-08-20 DIAGNOSIS — W19XXXA Unspecified fall, initial encounter: Secondary | ICD-10-CM | POA: Diagnosis not present

## 2020-08-20 DIAGNOSIS — I959 Hypotension, unspecified: Secondary | ICD-10-CM | POA: Diagnosis not present

## 2020-08-20 DIAGNOSIS — I498 Other specified cardiac arrhythmias: Secondary | ICD-10-CM | POA: Diagnosis not present

## 2020-08-20 DIAGNOSIS — Z90710 Acquired absence of both cervix and uterus: Secondary | ICD-10-CM | POA: Diagnosis not present

## 2020-08-20 DIAGNOSIS — U071 COVID-19: Secondary | ICD-10-CM | POA: Diagnosis not present

## 2020-08-20 DIAGNOSIS — R52 Pain, unspecified: Secondary | ICD-10-CM | POA: Diagnosis not present

## 2020-08-20 DIAGNOSIS — S0990XA Unspecified injury of head, initial encounter: Secondary | ICD-10-CM | POA: Diagnosis not present

## 2020-09-01 DIAGNOSIS — H353211 Exudative age-related macular degeneration, right eye, with active choroidal neovascularization: Secondary | ICD-10-CM | POA: Diagnosis not present

## 2020-09-01 DIAGNOSIS — H353231 Exudative age-related macular degeneration, bilateral, with active choroidal neovascularization: Secondary | ICD-10-CM | POA: Diagnosis not present

## 2020-09-28 DIAGNOSIS — M25562 Pain in left knee: Secondary | ICD-10-CM | POA: Diagnosis not present

## 2020-09-28 DIAGNOSIS — M25472 Effusion, left ankle: Secondary | ICD-10-CM | POA: Diagnosis not present

## 2020-09-28 DIAGNOSIS — R5383 Other fatigue: Secondary | ICD-10-CM | POA: Diagnosis not present

## 2020-09-28 DIAGNOSIS — Z6824 Body mass index (BMI) 24.0-24.9, adult: Secondary | ICD-10-CM | POA: Diagnosis not present

## 2020-09-28 DIAGNOSIS — M0600 Rheumatoid arthritis without rheumatoid factor, unspecified site: Secondary | ICD-10-CM | POA: Diagnosis not present

## 2020-09-28 DIAGNOSIS — M8000XD Age-related osteoporosis with current pathological fracture, unspecified site, subsequent encounter for fracture with routine healing: Secondary | ICD-10-CM | POA: Diagnosis not present

## 2020-09-28 DIAGNOSIS — R768 Other specified abnormal immunological findings in serum: Secondary | ICD-10-CM | POA: Diagnosis not present

## 2020-09-28 DIAGNOSIS — M150 Primary generalized (osteo)arthritis: Secondary | ICD-10-CM | POA: Diagnosis not present

## 2020-09-28 DIAGNOSIS — Z79899 Other long term (current) drug therapy: Secondary | ICD-10-CM | POA: Diagnosis not present

## 2020-10-05 DIAGNOSIS — M79672 Pain in left foot: Secondary | ICD-10-CM | POA: Diagnosis not present

## 2020-10-05 DIAGNOSIS — S92315A Nondisplaced fracture of first metatarsal bone, left foot, initial encounter for closed fracture: Secondary | ICD-10-CM | POA: Diagnosis not present

## 2020-10-05 DIAGNOSIS — M79675 Pain in left toe(s): Secondary | ICD-10-CM | POA: Diagnosis not present

## 2020-10-07 DIAGNOSIS — I739 Peripheral vascular disease, unspecified: Secondary | ICD-10-CM | POA: Diagnosis not present

## 2020-10-28 DIAGNOSIS — R0989 Other specified symptoms and signs involving the circulatory and respiratory systems: Secondary | ICD-10-CM | POA: Diagnosis not present

## 2020-10-28 DIAGNOSIS — H35033 Hypertensive retinopathy, bilateral: Secondary | ICD-10-CM | POA: Diagnosis not present

## 2020-10-28 DIAGNOSIS — H43813 Vitreous degeneration, bilateral: Secondary | ICD-10-CM | POA: Diagnosis not present

## 2020-10-28 DIAGNOSIS — H353231 Exudative age-related macular degeneration, bilateral, with active choroidal neovascularization: Secondary | ICD-10-CM | POA: Diagnosis not present

## 2020-10-28 DIAGNOSIS — I951 Orthostatic hypotension: Secondary | ICD-10-CM | POA: Diagnosis not present

## 2020-10-28 DIAGNOSIS — D519 Vitamin B12 deficiency anemia, unspecified: Secondary | ICD-10-CM | POA: Diagnosis not present

## 2020-10-28 DIAGNOSIS — Z86718 Personal history of other venous thrombosis and embolism: Secondary | ICD-10-CM | POA: Diagnosis not present

## 2020-10-28 DIAGNOSIS — H35423 Microcystoid degeneration of retina, bilateral: Secondary | ICD-10-CM | POA: Diagnosis not present

## 2020-10-28 DIAGNOSIS — R55 Syncope and collapse: Secondary | ICD-10-CM | POA: Diagnosis not present

## 2020-11-16 DIAGNOSIS — R2681 Unsteadiness on feet: Secondary | ICD-10-CM | POA: Diagnosis not present

## 2020-11-16 DIAGNOSIS — I951 Orthostatic hypotension: Secondary | ICD-10-CM | POA: Diagnosis not present

## 2020-11-16 DIAGNOSIS — R55 Syncope and collapse: Secondary | ICD-10-CM | POA: Diagnosis not present

## 2020-11-16 DIAGNOSIS — D519 Vitamin B12 deficiency anemia, unspecified: Secondary | ICD-10-CM | POA: Diagnosis not present

## 2020-11-16 DIAGNOSIS — Z86718 Personal history of other venous thrombosis and embolism: Secondary | ICD-10-CM | POA: Diagnosis not present

## 2020-11-16 DIAGNOSIS — R0989 Other specified symptoms and signs involving the circulatory and respiratory systems: Secondary | ICD-10-CM | POA: Diagnosis not present

## 2020-11-16 DIAGNOSIS — Z7409 Other reduced mobility: Secondary | ICD-10-CM | POA: Diagnosis not present

## 2020-11-30 DIAGNOSIS — Z961 Presence of intraocular lens: Secondary | ICD-10-CM | POA: Diagnosis not present

## 2020-11-30 DIAGNOSIS — H353231 Exudative age-related macular degeneration, bilateral, with active choroidal neovascularization: Secondary | ICD-10-CM | POA: Diagnosis not present

## 2020-11-30 DIAGNOSIS — H16223 Keratoconjunctivitis sicca, not specified as Sjogren's, bilateral: Secondary | ICD-10-CM | POA: Diagnosis not present

## 2020-12-15 DIAGNOSIS — G629 Polyneuropathy, unspecified: Secondary | ICD-10-CM | POA: Diagnosis not present

## 2020-12-15 DIAGNOSIS — I951 Orthostatic hypotension: Secondary | ICD-10-CM | POA: Diagnosis not present

## 2020-12-15 DIAGNOSIS — D519 Vitamin B12 deficiency anemia, unspecified: Secondary | ICD-10-CM | POA: Diagnosis not present

## 2020-12-15 DIAGNOSIS — Z7901 Long term (current) use of anticoagulants: Secondary | ICD-10-CM | POA: Diagnosis not present

## 2020-12-15 DIAGNOSIS — H409 Unspecified glaucoma: Secondary | ICD-10-CM | POA: Diagnosis not present

## 2020-12-15 DIAGNOSIS — M069 Rheumatoid arthritis, unspecified: Secondary | ICD-10-CM | POA: Diagnosis not present

## 2020-12-15 DIAGNOSIS — M4856XD Collapsed vertebra, not elsewhere classified, lumbar region, subsequent encounter for fracture with routine healing: Secondary | ICD-10-CM | POA: Diagnosis not present

## 2020-12-15 DIAGNOSIS — Z86718 Personal history of other venous thrombosis and embolism: Secondary | ICD-10-CM | POA: Diagnosis not present

## 2020-12-15 DIAGNOSIS — Z9181 History of falling: Secondary | ICD-10-CM | POA: Diagnosis not present

## 2020-12-15 DIAGNOSIS — I5031 Acute diastolic (congestive) heart failure: Secondary | ICD-10-CM | POA: Diagnosis not present

## 2020-12-15 DIAGNOSIS — E782 Mixed hyperlipidemia: Secondary | ICD-10-CM | POA: Diagnosis not present

## 2020-12-15 DIAGNOSIS — H3530 Unspecified macular degeneration: Secondary | ICD-10-CM | POA: Diagnosis not present

## 2020-12-21 DIAGNOSIS — I739 Peripheral vascular disease, unspecified: Secondary | ICD-10-CM | POA: Diagnosis not present

## 2020-12-22 DIAGNOSIS — M4856XD Collapsed vertebra, not elsewhere classified, lumbar region, subsequent encounter for fracture with routine healing: Secondary | ICD-10-CM | POA: Diagnosis not present

## 2020-12-22 DIAGNOSIS — D519 Vitamin B12 deficiency anemia, unspecified: Secondary | ICD-10-CM | POA: Diagnosis not present

## 2020-12-22 DIAGNOSIS — M069 Rheumatoid arthritis, unspecified: Secondary | ICD-10-CM | POA: Diagnosis not present

## 2020-12-22 DIAGNOSIS — H409 Unspecified glaucoma: Secondary | ICD-10-CM | POA: Diagnosis not present

## 2020-12-22 DIAGNOSIS — I5031 Acute diastolic (congestive) heart failure: Secondary | ICD-10-CM | POA: Diagnosis not present

## 2020-12-22 DIAGNOSIS — H3530 Unspecified macular degeneration: Secondary | ICD-10-CM | POA: Diagnosis not present

## 2020-12-28 DIAGNOSIS — Z23 Encounter for immunization: Secondary | ICD-10-CM | POA: Diagnosis not present

## 2020-12-28 DIAGNOSIS — Z20828 Contact with and (suspected) exposure to other viral communicable diseases: Secondary | ICD-10-CM | POA: Diagnosis not present

## 2020-12-29 DIAGNOSIS — M4856XD Collapsed vertebra, not elsewhere classified, lumbar region, subsequent encounter for fracture with routine healing: Secondary | ICD-10-CM | POA: Diagnosis not present

## 2020-12-29 DIAGNOSIS — I5031 Acute diastolic (congestive) heart failure: Secondary | ICD-10-CM | POA: Diagnosis not present

## 2020-12-29 DIAGNOSIS — D519 Vitamin B12 deficiency anemia, unspecified: Secondary | ICD-10-CM | POA: Diagnosis not present

## 2020-12-29 DIAGNOSIS — H3530 Unspecified macular degeneration: Secondary | ICD-10-CM | POA: Diagnosis not present

## 2020-12-29 DIAGNOSIS — M069 Rheumatoid arthritis, unspecified: Secondary | ICD-10-CM | POA: Diagnosis not present

## 2020-12-29 DIAGNOSIS — H409 Unspecified glaucoma: Secondary | ICD-10-CM | POA: Diagnosis not present

## 2020-12-30 DIAGNOSIS — H353231 Exudative age-related macular degeneration, bilateral, with active choroidal neovascularization: Secondary | ICD-10-CM | POA: Diagnosis not present

## 2020-12-30 DIAGNOSIS — H353211 Exudative age-related macular degeneration, right eye, with active choroidal neovascularization: Secondary | ICD-10-CM | POA: Diagnosis not present

## 2021-01-03 DIAGNOSIS — F419 Anxiety disorder, unspecified: Secondary | ICD-10-CM

## 2021-01-03 HISTORY — DX: Anxiety disorder, unspecified: F41.9

## 2021-01-04 DIAGNOSIS — M0600 Rheumatoid arthritis without rheumatoid factor, unspecified site: Secondary | ICD-10-CM | POA: Diagnosis not present

## 2021-01-04 DIAGNOSIS — Z6823 Body mass index (BMI) 23.0-23.9, adult: Secondary | ICD-10-CM | POA: Diagnosis not present

## 2021-01-04 DIAGNOSIS — M25562 Pain in left knee: Secondary | ICD-10-CM | POA: Diagnosis not present

## 2021-01-04 DIAGNOSIS — R768 Other specified abnormal immunological findings in serum: Secondary | ICD-10-CM | POA: Diagnosis not present

## 2021-01-04 DIAGNOSIS — M150 Primary generalized (osteo)arthritis: Secondary | ICD-10-CM | POA: Diagnosis not present

## 2021-01-04 DIAGNOSIS — R5382 Chronic fatigue, unspecified: Secondary | ICD-10-CM | POA: Diagnosis not present

## 2021-01-04 DIAGNOSIS — M8000XD Age-related osteoporosis with current pathological fracture, unspecified site, subsequent encounter for fracture with routine healing: Secondary | ICD-10-CM | POA: Diagnosis not present

## 2021-01-07 DIAGNOSIS — M4856XD Collapsed vertebra, not elsewhere classified, lumbar region, subsequent encounter for fracture with routine healing: Secondary | ICD-10-CM | POA: Diagnosis not present

## 2021-01-07 DIAGNOSIS — M069 Rheumatoid arthritis, unspecified: Secondary | ICD-10-CM | POA: Diagnosis not present

## 2021-01-07 DIAGNOSIS — D519 Vitamin B12 deficiency anemia, unspecified: Secondary | ICD-10-CM | POA: Diagnosis not present

## 2021-01-07 DIAGNOSIS — I5031 Acute diastolic (congestive) heart failure: Secondary | ICD-10-CM | POA: Diagnosis not present

## 2021-01-07 DIAGNOSIS — H409 Unspecified glaucoma: Secondary | ICD-10-CM | POA: Diagnosis not present

## 2021-01-07 DIAGNOSIS — H3530 Unspecified macular degeneration: Secondary | ICD-10-CM | POA: Diagnosis not present

## 2021-01-10 DIAGNOSIS — W19XXXA Unspecified fall, initial encounter: Secondary | ICD-10-CM | POA: Diagnosis not present

## 2021-01-10 DIAGNOSIS — R42 Dizziness and giddiness: Secondary | ICD-10-CM | POA: Diagnosis not present

## 2021-01-10 DIAGNOSIS — I959 Hypotension, unspecified: Secondary | ICD-10-CM | POA: Diagnosis not present

## 2021-01-10 DIAGNOSIS — R0902 Hypoxemia: Secondary | ICD-10-CM | POA: Diagnosis not present

## 2021-01-10 DIAGNOSIS — R0689 Other abnormalities of breathing: Secondary | ICD-10-CM | POA: Diagnosis not present

## 2021-01-12 DIAGNOSIS — M069 Rheumatoid arthritis, unspecified: Secondary | ICD-10-CM | POA: Diagnosis not present

## 2021-01-12 DIAGNOSIS — I5031 Acute diastolic (congestive) heart failure: Secondary | ICD-10-CM | POA: Diagnosis not present

## 2021-01-12 DIAGNOSIS — H409 Unspecified glaucoma: Secondary | ICD-10-CM | POA: Diagnosis not present

## 2021-01-12 DIAGNOSIS — M4856XD Collapsed vertebra, not elsewhere classified, lumbar region, subsequent encounter for fracture with routine healing: Secondary | ICD-10-CM | POA: Diagnosis not present

## 2021-01-12 DIAGNOSIS — H3530 Unspecified macular degeneration: Secondary | ICD-10-CM | POA: Diagnosis not present

## 2021-01-12 DIAGNOSIS — D519 Vitamin B12 deficiency anemia, unspecified: Secondary | ICD-10-CM | POA: Diagnosis not present

## 2021-01-14 DIAGNOSIS — M069 Rheumatoid arthritis, unspecified: Secondary | ICD-10-CM | POA: Diagnosis not present

## 2021-01-14 DIAGNOSIS — Z7901 Long term (current) use of anticoagulants: Secondary | ICD-10-CM | POA: Diagnosis not present

## 2021-01-14 DIAGNOSIS — H3530 Unspecified macular degeneration: Secondary | ICD-10-CM | POA: Diagnosis not present

## 2021-01-14 DIAGNOSIS — I5031 Acute diastolic (congestive) heart failure: Secondary | ICD-10-CM | POA: Diagnosis not present

## 2021-01-14 DIAGNOSIS — I951 Orthostatic hypotension: Secondary | ICD-10-CM | POA: Diagnosis not present

## 2021-01-14 DIAGNOSIS — E782 Mixed hyperlipidemia: Secondary | ICD-10-CM | POA: Diagnosis not present

## 2021-01-14 DIAGNOSIS — M4856XD Collapsed vertebra, not elsewhere classified, lumbar region, subsequent encounter for fracture with routine healing: Secondary | ICD-10-CM | POA: Diagnosis not present

## 2021-01-14 DIAGNOSIS — H409 Unspecified glaucoma: Secondary | ICD-10-CM | POA: Diagnosis not present

## 2021-01-14 DIAGNOSIS — Z9181 History of falling: Secondary | ICD-10-CM | POA: Diagnosis not present

## 2021-01-14 DIAGNOSIS — G629 Polyneuropathy, unspecified: Secondary | ICD-10-CM | POA: Diagnosis not present

## 2021-01-14 DIAGNOSIS — D519 Vitamin B12 deficiency anemia, unspecified: Secondary | ICD-10-CM | POA: Diagnosis not present

## 2021-01-14 DIAGNOSIS — Z86718 Personal history of other venous thrombosis and embolism: Secondary | ICD-10-CM | POA: Diagnosis not present

## 2021-01-20 DIAGNOSIS — D519 Vitamin B12 deficiency anemia, unspecified: Secondary | ICD-10-CM | POA: Diagnosis not present

## 2021-01-20 DIAGNOSIS — R2681 Unsteadiness on feet: Secondary | ICD-10-CM | POA: Diagnosis not present

## 2021-01-20 DIAGNOSIS — I951 Orthostatic hypotension: Secondary | ICD-10-CM | POA: Diagnosis not present

## 2021-01-20 DIAGNOSIS — R0989 Other specified symptoms and signs involving the circulatory and respiratory systems: Secondary | ICD-10-CM | POA: Diagnosis not present

## 2021-01-20 DIAGNOSIS — Z7409 Other reduced mobility: Secondary | ICD-10-CM | POA: Diagnosis not present

## 2021-01-20 DIAGNOSIS — R55 Syncope and collapse: Secondary | ICD-10-CM | POA: Diagnosis not present

## 2021-01-20 DIAGNOSIS — Z86718 Personal history of other venous thrombosis and embolism: Secondary | ICD-10-CM | POA: Diagnosis not present

## 2021-01-21 DIAGNOSIS — H409 Unspecified glaucoma: Secondary | ICD-10-CM | POA: Diagnosis not present

## 2021-01-21 DIAGNOSIS — I5031 Acute diastolic (congestive) heart failure: Secondary | ICD-10-CM | POA: Diagnosis not present

## 2021-01-21 DIAGNOSIS — H3530 Unspecified macular degeneration: Secondary | ICD-10-CM | POA: Diagnosis not present

## 2021-01-21 DIAGNOSIS — M069 Rheumatoid arthritis, unspecified: Secondary | ICD-10-CM | POA: Diagnosis not present

## 2021-01-21 DIAGNOSIS — D519 Vitamin B12 deficiency anemia, unspecified: Secondary | ICD-10-CM | POA: Diagnosis not present

## 2021-01-21 DIAGNOSIS — M4856XD Collapsed vertebra, not elsewhere classified, lumbar region, subsequent encounter for fracture with routine healing: Secondary | ICD-10-CM | POA: Diagnosis not present

## 2021-01-24 ENCOUNTER — Other Ambulatory Visit (HOSPITAL_COMMUNITY): Payer: Self-pay | Admitting: Internal Medicine

## 2021-01-24 DIAGNOSIS — R0989 Other specified symptoms and signs involving the circulatory and respiratory systems: Secondary | ICD-10-CM

## 2021-01-26 ENCOUNTER — Ambulatory Visit (HOSPITAL_COMMUNITY)
Admission: RE | Admit: 2021-01-26 | Discharge: 2021-01-26 | Disposition: A | Discharge status: Home / Self Care | Payer: Medicare Other | Source: Ambulatory Visit | Attending: Internal Medicine | Admitting: Internal Medicine

## 2021-01-26 ENCOUNTER — Other Ambulatory Visit: Payer: Self-pay

## 2021-01-26 DIAGNOSIS — R0989 Other specified symptoms and signs involving the circulatory and respiratory systems: Secondary | ICD-10-CM | POA: Insufficient documentation

## 2021-01-28 DIAGNOSIS — H3530 Unspecified macular degeneration: Secondary | ICD-10-CM | POA: Diagnosis not present

## 2021-01-28 DIAGNOSIS — M069 Rheumatoid arthritis, unspecified: Secondary | ICD-10-CM | POA: Diagnosis not present

## 2021-01-28 DIAGNOSIS — I5031 Acute diastolic (congestive) heart failure: Secondary | ICD-10-CM | POA: Diagnosis not present

## 2021-01-28 DIAGNOSIS — H409 Unspecified glaucoma: Secondary | ICD-10-CM | POA: Diagnosis not present

## 2021-01-28 DIAGNOSIS — M4856XD Collapsed vertebra, not elsewhere classified, lumbar region, subsequent encounter for fracture with routine healing: Secondary | ICD-10-CM | POA: Diagnosis not present

## 2021-01-28 DIAGNOSIS — D519 Vitamin B12 deficiency anemia, unspecified: Secondary | ICD-10-CM | POA: Diagnosis not present

## 2021-02-03 DIAGNOSIS — M8000XD Age-related osteoporosis with current pathological fracture, unspecified site, subsequent encounter for fracture with routine healing: Secondary | ICD-10-CM | POA: Diagnosis not present

## 2021-02-13 DIAGNOSIS — E860 Dehydration: Secondary | ICD-10-CM | POA: Diagnosis not present

## 2021-02-13 DIAGNOSIS — K6289 Other specified diseases of anus and rectum: Secondary | ICD-10-CM | POA: Diagnosis not present

## 2021-02-13 DIAGNOSIS — K629 Disease of anus and rectum, unspecified: Secondary | ICD-10-CM | POA: Diagnosis not present

## 2021-02-13 DIAGNOSIS — I959 Hypotension, unspecified: Secondary | ICD-10-CM | POA: Diagnosis not present

## 2021-02-13 DIAGNOSIS — R531 Weakness: Secondary | ICD-10-CM | POA: Diagnosis not present

## 2021-02-13 DIAGNOSIS — R197 Diarrhea, unspecified: Secondary | ICD-10-CM | POA: Diagnosis not present

## 2021-02-13 DIAGNOSIS — U071 COVID-19: Secondary | ICD-10-CM | POA: Diagnosis not present

## 2021-02-13 DIAGNOSIS — R109 Unspecified abdominal pain: Secondary | ICD-10-CM | POA: Diagnosis not present

## 2021-02-15 DIAGNOSIS — D519 Vitamin B12 deficiency anemia, unspecified: Secondary | ICD-10-CM | POA: Diagnosis not present

## 2021-02-15 DIAGNOSIS — R768 Other specified abnormal immunological findings in serum: Secondary | ICD-10-CM | POA: Diagnosis not present

## 2021-02-15 DIAGNOSIS — E871 Hypo-osmolality and hyponatremia: Secondary | ICD-10-CM | POA: Diagnosis not present

## 2021-02-15 DIAGNOSIS — E46 Unspecified protein-calorie malnutrition: Secondary | ICD-10-CM | POA: Diagnosis not present

## 2021-02-15 DIAGNOSIS — Z86718 Personal history of other venous thrombosis and embolism: Secondary | ICD-10-CM | POA: Diagnosis not present

## 2021-02-15 DIAGNOSIS — R21 Rash and other nonspecific skin eruption: Secondary | ICD-10-CM | POA: Diagnosis not present

## 2021-02-15 DIAGNOSIS — K629 Disease of anus and rectum, unspecified: Secondary | ICD-10-CM | POA: Diagnosis not present

## 2021-02-15 DIAGNOSIS — R55 Syncope and collapse: Secondary | ICD-10-CM | POA: Diagnosis not present

## 2021-02-15 DIAGNOSIS — I951 Orthostatic hypotension: Secondary | ICD-10-CM | POA: Diagnosis not present

## 2021-02-15 DIAGNOSIS — M0600 Rheumatoid arthritis without rheumatoid factor, unspecified site: Secondary | ICD-10-CM | POA: Diagnosis not present

## 2021-02-15 DIAGNOSIS — Z7409 Other reduced mobility: Secondary | ICD-10-CM | POA: Diagnosis not present

## 2021-02-15 DIAGNOSIS — I700 Atherosclerosis of aorta: Secondary | ICD-10-CM | POA: Diagnosis not present

## 2021-02-17 DIAGNOSIS — H353231 Exudative age-related macular degeneration, bilateral, with active choroidal neovascularization: Secondary | ICD-10-CM | POA: Diagnosis not present

## 2021-02-17 DIAGNOSIS — H43813 Vitreous degeneration, bilateral: Secondary | ICD-10-CM | POA: Diagnosis not present

## 2021-02-17 DIAGNOSIS — H35423 Microcystoid degeneration of retina, bilateral: Secondary | ICD-10-CM | POA: Diagnosis not present

## 2021-02-17 DIAGNOSIS — H35033 Hypertensive retinopathy, bilateral: Secondary | ICD-10-CM | POA: Diagnosis not present

## 2021-03-08 DIAGNOSIS — L89892 Pressure ulcer of other site, stage 2: Secondary | ICD-10-CM | POA: Diagnosis not present

## 2021-03-08 DIAGNOSIS — M79675 Pain in left toe(s): Secondary | ICD-10-CM | POA: Diagnosis not present

## 2021-03-08 DIAGNOSIS — I739 Peripheral vascular disease, unspecified: Secondary | ICD-10-CM | POA: Diagnosis not present

## 2021-03-08 DIAGNOSIS — M79672 Pain in left foot: Secondary | ICD-10-CM | POA: Diagnosis not present

## 2021-04-02 DIAGNOSIS — Z20828 Contact with and (suspected) exposure to other viral communicable diseases: Secondary | ICD-10-CM | POA: Diagnosis not present

## 2021-04-07 ENCOUNTER — Ambulatory Visit: Payer: Self-pay | Admitting: Cardiology

## 2021-04-14 DIAGNOSIS — H353231 Exudative age-related macular degeneration, bilateral, with active choroidal neovascularization: Secondary | ICD-10-CM | POA: Diagnosis not present

## 2021-04-19 ENCOUNTER — Ambulatory Visit: Payer: Self-pay | Admitting: Cardiology

## 2021-04-19 DIAGNOSIS — M0600 Rheumatoid arthritis without rheumatoid factor, unspecified site: Secondary | ICD-10-CM | POA: Diagnosis not present

## 2021-04-19 DIAGNOSIS — R768 Other specified abnormal immunological findings in serum: Secondary | ICD-10-CM | POA: Diagnosis not present

## 2021-04-19 DIAGNOSIS — M150 Primary generalized (osteo)arthritis: Secondary | ICD-10-CM | POA: Diagnosis not present

## 2021-04-19 DIAGNOSIS — M25562 Pain in left knee: Secondary | ICD-10-CM | POA: Diagnosis not present

## 2021-04-19 DIAGNOSIS — M8000XD Age-related osteoporosis with current pathological fracture, unspecified site, subsequent encounter for fracture with routine healing: Secondary | ICD-10-CM | POA: Diagnosis not present

## 2021-04-19 DIAGNOSIS — Z6822 Body mass index (BMI) 22.0-22.9, adult: Secondary | ICD-10-CM | POA: Diagnosis not present

## 2021-05-05 ENCOUNTER — Encounter: Payer: Self-pay | Admitting: Neurology

## 2021-05-05 ENCOUNTER — Ambulatory Visit (INDEPENDENT_AMBULATORY_CARE_PROVIDER_SITE_OTHER): Payer: Medicare Other | Admitting: Neurology

## 2021-05-05 VITALS — BP 150/64 | HR 70 | Ht 64.0 in | Wt 130.8 lb

## 2021-05-05 DIAGNOSIS — R55 Syncope and collapse: Secondary | ICD-10-CM | POA: Diagnosis not present

## 2021-05-05 DIAGNOSIS — G250 Essential tremor: Secondary | ICD-10-CM | POA: Diagnosis not present

## 2021-05-05 DIAGNOSIS — R269 Unspecified abnormalities of gait and mobility: Secondary | ICD-10-CM | POA: Diagnosis not present

## 2021-05-05 MED ORDER — METOPROLOL SUCCINATE ER 25 MG PO TB24: 25.0000 mg | ORAL_TABLET | Freq: Every day | ORAL | 5 refills | Status: DC

## 2021-05-05 NOTE — Progress Notes (Signed)
[facility]  PATIENT: Alexandria Weaver DOB: 1924/11/30  REFERRING DOCTOR OR PCP:   Sueanne Margarita DO SOURCE: Patient, notes from primary care, lab results  _________________________________   HISTORICAL  CHIEF COMPLAINT:  Chief Complaint  Patient presents with   New Patient (Initial Visit)    Rm 16, w daughter Cathie Beams. Paper referral from [facility], DO for syncope w multiple episodes. Pt has not had syncope episodes for last 2 months. Pt was started on Buspar and has been doing well. Factor was anxiety. Pt would like to discuss today, bilateral hand shaking, mainly right hand. Loss of sensitivity and ice cold at times.     HISTORY OF PRESENT ILLNESS:  I had the pleasure of seeing patient, Alexandria Weaver, at [facility] for neurologic consultation regarding her tremors and history of syncope.  She is a 88 year old woman with tremor that started her hands about 6 months ago.  Additionally, she has had some episodes of syncope.   She has noted a tremor in her hands right > left.   The tremor is worse with intention.    It may also worsen with anxiety.     The tremor makes it more difficult for her to eat.  She has not tried any medication for it yet.  There is no family history of tremor.  She does not feel her gait has changed in the last 6 months.  She has no muscle rigidity.  She had several episodes of syncope preceding with lightheadedness.  These occurred 4-5 times total, all in the morning.   She does not feel lightheaded upon standing.   However, she will stand for a few seconds before she starts walking.    She has anxiety, helped by moving Celexa to the morning and taking Buspar.    She has not had any more episodes of syncope since starting these medications.   REVIEW OF SYSTEMS: Constitutional: No fevers, chills, sweats, or change in appetite Eyes: No visual changes, double vision, eye pain Ear, nose and throat: No hearing loss, ear  pain, nasal congestion, sore throat Cardiovascular: No chest pain, palpitations Respiratory:  No shortness of breath at rest or with exertion.   No wheezes GastrointestinaI: No nausea, vomiting, diarrhea, abdominal pain, fecal incontinence Genitourinary:  No dysuria, urinary retention or frequency.  No nocturia. Musculoskeletal:  No neck pain.  Some back pain.  She had kyphoplasty in 2021.  She has a diagnosis of rheumatoid arthritis and is on methotrexate. Integumentary: No rash, pruritus, skin lesions Neurological: as above Psychiatric: No depression at this time.  No anxiety Endocrine: No palpitations, diaphoresis, change in appetite, change in weigh or increased thirst Hematologic/Lymphatic:  No anemia, purpura, petechiae. Allergic/Immunologic: No itchy/runny eyes, nasal congestion, recent allergic reactions, rashes  ALLERGIES: Allergies  Allergen Reactions   Doxycycline Nausea Only   Penicillins Rash    HOME MEDICATIONS:  Current Outpatient Medications:    acetaminophen (TYLENOL) 500 MG tablet, Take 1,000 mg by mouth every 6 (six) hours as needed for moderate pain. , Disp: , Rfl:    busPIRone (BUSPAR) 5 MG tablet, Take 5 mg by mouth 2 (two) times daily. Taking 1 tablet for now, Disp: , Rfl:    Cholecalciferol (VITAMIN D) 2000 units tablet, Take 2,000 Units by mouth daily., Disp: , Rfl:    citalopram (CELEXA) 10 MG tablet, Take 10 mg by mouth at bedtime. , Disp: , Rfl:    folic acid (FOLVITE) 1 MG tablet, Take  1 mg by mouth daily., Disp: , Rfl:    methotrexate 2.5 MG tablet, Take 15 mg by mouth once a week., Disp: , Rfl:    metoprolol succinate (TOPROL XL) 25 MG 24 hr tablet, Take 1 tablet (25 mg total) by mouth daily., Disp: 30 tablet, Rfl: 5   XARELTO 15 MG TABS tablet, Take 15 mg by mouth daily with supper. , Disp: , Rfl: 0  PAST MEDICAL HISTORY: Past Medical History:  Diagnosis Date   Acute deep vein thrombosis (DVT) of distal end of left lower extremity (HCC)    Anxiety     Arthritis    RA   Frequent nosebleeds    once a week   Mass in rectum    Neuropathy     PAST SURGICAL HISTORY: Past Surgical History:  Procedure Laterality Date   ABDOMINAL HYSTERECTOMY     CATARACT EXTRACTION, BILATERAL     KYPHOPLASTY N/A 09/05/2019   Procedure: Lumbar One Kyphoplasty;  Surgeon: Ashok Pall, MD;  Location: [place];  Service: Neurosurgery;  Laterality: N/A;  3C   TOTAL HIP ARTHROPLASTY Left 2002    FAMILY HISTORY: Family History  Problem Relation Age of Onset   Pneumonia Mother    Stroke Father    Factor V Leiden deficiency Daughter    Lymphoma Daughter    Asthma Son    Allergic rhinitis Son    Angioedema Neg Hx    Eczema Neg Hx    Urticaria Neg Hx    Immunodeficiency Neg Hx     SOCIAL HISTORY:  Social History   Socioeconomic History   Marital status: Widowed    Spouse name: Not on file   Number of children: 2   Years of education: Not on file   Highest education level: High school graduate  Occupational History   Not on file  Tobacco Use   Smoking status: Never   Smokeless tobacco: Never  Substance and Sexual Activity   Alcohol use: Never   Drug use: Never   Sexual activity: Not on file  Other Topics Concern   Not on file  Social History Narrative   Lives alone   Right handed   Caffeine: occ coffee. Hot tea is decaf. Sweet tea 2 glasses a day   Social Determinants of Health   Financial Resource Strain: Not on file  Food Insecurity: Not on file  Transportation Needs: Not on file  Physical Activity: Not on file  Stress: Not on file  Social Connections: Not on file  Intimate Partner Violence: Not on file     PHYSICAL EXAM  Vitals:   05/05/21 1034  BP: (!) 150/64  Pulse: 70  Weight: 130 lb 12.8 oz (59.3 kg)  Height: 5\' 4"  (1.626 m)    Body mass index is 22.45 kg/m.   General: The patient is well-developed and well-nourished and in no acute distress  HEENT:  Head is [place]/AT.  Sclera are anicteric.  Funduscopic exam  shows normal optic discs and retinal vessels.  Neck: No carotid bruits are noted.  The neck is nontender.  Cardiovascular: The heart has a regular rate and rhythm with a normal S1 and S2. There were no murmurs, gallops or rubs.    Skin: Extremities are without rash or  edema.  Musculoskeletal:  Back is nontender  Neurologic Exam  Mental status: The patient is alert and oriented x 3 at the time of the examination. The patient has apparent normal recent and remote memory, with an apparently normal  attention span and concentration ability.   Speech is normal.  Cranial nerves: Extraocular movements are full.  Facial symmetry is present. There is good facial sensation to soft touch bilaterally.Facial strength is normal.  Trapezius and sternocleidomastoid strength is normal. No dysarthria is noted.  The tongue is midline, and the patient has symmetric elevation of the soft palate. No obvious hearing deficits are noted.  Motor: 7 Hz low amplitude tremor in the hands, right more than left.  Intention increases the tremor .  There is no tremor at rest.  No rigidity.  No bradykinesia.  Muscle bulk is normal.   Tone is normal. Strength is  5 / 5 in all 4 extremities.   Sensory: Sensory testing is intact to pinprick, soft touch and vibration sensation in all 4 extremities.  Coordination: Cerebellar testing reveals good finger-nose-finger and heel-to-shin bilaterally.  Gait and station: Station is normal.   She can walk without her walker a couple steps but is unsteady.  Due to her unsteadiness, I could not check for retropulsion.. Romberg is negative.   Reflexes: Deep tendon reflexes are symmetric and normal bilaterally.        DIAGNOSTIC DATA (LABS, IMAGING, TESTING) - I reviewed patient records, labs, notes, testing and imaging myself where available.  Lab Results  Component Value Date   WBC 8.3 09/04/2019   HGB 13.2 09/04/2019   HCT 41.6 09/04/2019   MCV 92.2 09/04/2019   PLT 317  09/04/2019      Component Value Date/Time   NA 137 09/04/2019 1230   K 4.3 09/04/2019 1230   CL 99 09/04/2019 1230   CO2 29 09/04/2019 1230   GLUCOSE 92 09/04/2019 1230   BUN 19 09/04/2019 1230   CREATININE 0.86 09/04/2019 1230   CALCIUM 9.1 09/04/2019 1230   PROT 7.2 08/20/2017 1658   ALBUMIN 3.6 08/20/2017 1658   AST 18 08/20/2017 1658   ALT 11 (L) 08/20/2017 1658   ALKPHOS 53 08/20/2017 1658   BILITOT 0.9 08/20/2017 1658   GFRNONAA 59 (L) 09/04/2019 1230   GFRAA >60 09/04/2019 1230       ASSESSMENT AND PLAN  Benign essential tremor  Gait disturbance  Syncope, unspecified syncope type  In summary, Alexandria Weaver is a 88 year old woman who has had a tremor over the last year as well as some episodes of syncope.  Tremors most consistent with benign essential tremor.  She is concerned about the possibility of Parkinson's disease.  The tremor is not consistent with PD.  Additionally, muscle tone was normal and there was no bradykinesia.  She does have a poor gait but this has been a chronic issue that has slowly worsened and likely due to other medical issues.  Trial of metoprolol.  Consider increase dose if partial effect or change to a benzodiazepine (I.e clonazepam 0.5 mg bid) if not effective.   She has not had any more syncope spells.  We will reevaluate if these worsen.   Rtc prn.   She will call if new or worsening neurologic symptoms.   If tremor is worsening rapidly then we will check MRI.    Thank you for asking me to see [name].  Please let me know if I can be of further assistance with her or other patients in the future.   Deneise Getty A. Felecia Shelling, MD, [facility] 04/18/3233, 5:73 PM Certified in Neurology, Clinical Neurophysiology, Sleep Medicine and Neuroimaging  [facility] [address] [place], [place] [address] [phone]

## 2021-05-09 DIAGNOSIS — Z5320 Procedure and treatment not carried out because of patient's decision for unspecified reasons: Secondary | ICD-10-CM | POA: Diagnosis not present

## 2021-05-09 DIAGNOSIS — Z86718 Personal history of other venous thrombosis and embolism: Secondary | ICD-10-CM | POA: Diagnosis not present

## 2021-05-09 DIAGNOSIS — K6289 Other specified diseases of anus and rectum: Secondary | ICD-10-CM | POA: Diagnosis not present

## 2021-05-09 DIAGNOSIS — M25552 Pain in left hip: Secondary | ICD-10-CM | POA: Diagnosis not present

## 2021-05-09 DIAGNOSIS — Z7901 Long term (current) use of anticoagulants: Secondary | ICD-10-CM | POA: Diagnosis not present

## 2021-05-12 ENCOUNTER — Telehealth: Payer: Self-pay | Admitting: Gastroenterology

## 2021-05-12 NOTE — Telephone Encounter (Signed)
Hey Dr. Ardis Hughs,   We received a referral from [facility] for rectal mass and requesting EUS along with colonoscopy for patient. Records are in Epic as well as paper records. I will send for review. Could you please review and advise on scheduling?  Thank you

## 2021-05-13 ENCOUNTER — Encounter: Payer: Self-pay | Admitting: Gastroenterology

## 2021-05-13 NOTE — Telephone Encounter (Signed)
Left vm for patient to return call 

## 2021-05-16 ENCOUNTER — Other Ambulatory Visit (HOSPITAL_COMMUNITY): Payer: Self-pay | Admitting: Surgery

## 2021-05-16 ENCOUNTER — Other Ambulatory Visit: Payer: Self-pay | Admitting: Surgery

## 2021-05-16 DIAGNOSIS — K6289 Other specified diseases of anus and rectum: Secondary | ICD-10-CM

## 2021-05-16 DIAGNOSIS — Z5320 Procedure and treatment not carried out because of patient's decision for unspecified reasons: Secondary | ICD-10-CM

## 2021-05-16 DIAGNOSIS — Z86718 Personal history of other venous thrombosis and embolism: Secondary | ICD-10-CM

## 2021-05-16 DIAGNOSIS — Z7901 Long term (current) use of anticoagulants: Secondary | ICD-10-CM

## 2021-05-17 DIAGNOSIS — Z961 Presence of intraocular lens: Secondary | ICD-10-CM | POA: Diagnosis not present

## 2021-05-17 DIAGNOSIS — H353231 Exudative age-related macular degeneration, bilateral, with active choroidal neovascularization: Secondary | ICD-10-CM | POA: Diagnosis not present

## 2021-05-17 DIAGNOSIS — H26493 Other secondary cataract, bilateral: Secondary | ICD-10-CM | POA: Diagnosis not present

## 2021-05-24 DIAGNOSIS — L110 Acquired keratosis follicularis: Secondary | ICD-10-CM | POA: Diagnosis not present

## 2021-05-24 DIAGNOSIS — I739 Peripheral vascular disease, unspecified: Secondary | ICD-10-CM | POA: Diagnosis not present

## 2021-05-24 DIAGNOSIS — M79674 Pain in right toe(s): Secondary | ICD-10-CM | POA: Diagnosis not present

## 2021-05-24 DIAGNOSIS — M79672 Pain in left foot: Secondary | ICD-10-CM | POA: Diagnosis not present

## 2021-05-24 DIAGNOSIS — M79675 Pain in left toe(s): Secondary | ICD-10-CM | POA: Diagnosis not present

## 2021-05-24 DIAGNOSIS — E1140 Type 2 diabetes mellitus with diabetic neuropathy, unspecified: Secondary | ICD-10-CM | POA: Diagnosis not present

## 2021-05-24 DIAGNOSIS — M79671 Pain in right foot: Secondary | ICD-10-CM | POA: Diagnosis not present

## 2021-05-26 ENCOUNTER — Other Ambulatory Visit: Payer: Self-pay

## 2021-05-26 ENCOUNTER — Encounter (HOSPITAL_COMMUNITY): Payer: Self-pay

## 2021-05-26 ENCOUNTER — Ambulatory Visit (HOSPITAL_COMMUNITY)
Admission: RE | Admit: 2021-05-26 | Discharge: 2021-05-26 | Disposition: A | Discharge status: Home / Self Care | Payer: Medicare Other | Source: Ambulatory Visit | Attending: Surgery | Admitting: Surgery

## 2021-05-26 DIAGNOSIS — Z5320 Procedure and treatment not carried out because of patient's decision for unspecified reasons: Secondary | ICD-10-CM

## 2021-05-26 DIAGNOSIS — K6289 Other specified diseases of anus and rectum: Secondary | ICD-10-CM

## 2021-05-26 DIAGNOSIS — Z86718 Personal history of other venous thrombosis and embolism: Secondary | ICD-10-CM

## 2021-05-26 DIAGNOSIS — Z7901 Long term (current) use of anticoagulants: Secondary | ICD-10-CM

## 2021-06-09 DIAGNOSIS — H353231 Exudative age-related macular degeneration, bilateral, with active choroidal neovascularization: Secondary | ICD-10-CM | POA: Diagnosis not present

## 2021-06-09 DIAGNOSIS — H35423 Microcystoid degeneration of retina, bilateral: Secondary | ICD-10-CM | POA: Diagnosis not present

## 2021-06-09 DIAGNOSIS — H35033 Hypertensive retinopathy, bilateral: Secondary | ICD-10-CM | POA: Diagnosis not present

## 2021-06-09 DIAGNOSIS — H43813 Vitreous degeneration, bilateral: Secondary | ICD-10-CM | POA: Diagnosis not present

## 2021-06-14 ENCOUNTER — Other Ambulatory Visit: Payer: Self-pay

## 2021-06-14 ENCOUNTER — Ambulatory Visit (HOSPITAL_COMMUNITY)
Admission: RE | Admit: 2021-06-14 | Discharge: 2021-06-14 | Disposition: A | Discharge status: Home / Self Care | Payer: Medicare Other | Source: Ambulatory Visit | Attending: Surgery | Admitting: Surgery

## 2021-06-14 DIAGNOSIS — K6289 Other specified diseases of anus and rectum: Secondary | ICD-10-CM | POA: Insufficient documentation

## 2021-06-14 DIAGNOSIS — Z86718 Personal history of other venous thrombosis and embolism: Secondary | ICD-10-CM | POA: Diagnosis not present

## 2021-06-14 DIAGNOSIS — Z5320 Procedure and treatment not carried out because of patient's decision for unspecified reasons: Secondary | ICD-10-CM | POA: Diagnosis not present

## 2021-06-14 DIAGNOSIS — K5730 Diverticulosis of large intestine without perforation or abscess without bleeding: Secondary | ICD-10-CM | POA: Diagnosis not present

## 2021-06-14 DIAGNOSIS — Z7901 Long term (current) use of anticoagulants: Secondary | ICD-10-CM | POA: Diagnosis not present

## 2021-06-21 ENCOUNTER — Encounter: Payer: Self-pay | Admitting: Gastroenterology

## 2021-06-21 ENCOUNTER — Ambulatory Visit (INDEPENDENT_AMBULATORY_CARE_PROVIDER_SITE_OTHER): Payer: Medicare Other | Admitting: Gastroenterology

## 2021-06-21 ENCOUNTER — Telehealth: Payer: Self-pay

## 2021-06-21 VITALS — BP 120/74 | HR 58 | Ht 62.0 in | Wt 138.0 lb

## 2021-06-21 DIAGNOSIS — K6289 Other specified diseases of anus and rectum: Secondary | ICD-10-CM | POA: Diagnosis not present

## 2021-06-21 NOTE — Progress Notes (Signed)
? ?HPI: ?This is a very pleasant 88 year old woman who was referred by Dr. Annie Main gross for evaluation of rectal mass ? ?She is here with her daughter today.  She has had no changes in her bowels recently, no constipation, no diarrhea.  Her weight is actually up 6 pounds in the past 2 months.  She has had only a single episode of bleeding.  She underwent a CT scan for unclear reasons which suggested a rectal tumor.  This was followed up by referral to general surgery Dr. Johney Maine.  He ordered a MRI of the pelvis and this suggests T3N0 circumferential rectal lesion see below. ? ?Colon cancer does not run in her family ? ?Old Data Reviewed: ?She was sent by Dr. Johney Maine to evaluate a rectal mass. ? ?PCP is Dr. Sueanne Margarita ? ?MRI March 2023 showed a 6 cm long, circumferential rectal mass laying about 2 to 3 cm from the anus MRI staging showed this to be T3N0 lesion. ? ?Blood work November 2022 hemoglobin 10.1, normal MCV, complete metabolic profile was normal except for albumin 2.9 ? ?CT abdomen pelvis with IV and oral contrast "4.7 cm low-attenuation lesion in the left lateral wall of the inferior rectum and anal canal" ? ?She is on Xarelto for a history of DVT ? ? ? ?Review of systems: ?Pertinent positive and negative review of systems were noted in the above HPI section. All other review negative. ? ? ?Past Medical History:  ?Diagnosis Date  ? Acute deep vein thrombosis (DVT) of distal end of left lower extremity ([place])   ? Anxiety   ? Arthritis   ? RA  ? Frequent nosebleeds   ? once a week  ? Mass in rectum   ? Neuropathy   ? ? ?Past Surgical History:  ?Procedure Laterality Date  ? ABDOMINAL HYSTERECTOMY    ? CATARACT EXTRACTION, BILATERAL    ? KYPHOPLASTY N/A 09/05/2019  ? Procedure: Lumbar One Kyphoplasty;  Surgeon: Ashok Pall, MD;  Location: [place];  Service: Neurosurgery;  Laterality: N/A;  3C  ? TOTAL HIP ARTHROPLASTY Left 2002  ? ? ?Current Outpatient Medications  ?Medication Instructions  ? acetaminophen  (TYLENOL) 1,000 mg, Oral, Every 6 hours PRN  ? busPIRone (BUSPAR) 5 mg, Oral, 2 times daily, Taking 1 tablet for now  ? citalopram (CELEXA) 10 mg, Oral, Daily at bedtime  ? folic acid (FOLVITE) 1 mg, Oral, Daily  ? methotrexate 15 mg, Oral, Weekly  ? metoprolol succinate (TOPROL XL) 25 mg, Oral, Daily  ? Vitamin D 2,000 Units, Oral, Daily  ? Xarelto 15 mg, Oral, Daily with supper  ? ? ?Allergies as of 06/21/2021 - Review Complete 06/21/2021  ?Allergen Reaction Noted  ? Doxycycline Nausea Only 10/26/2015  ? Penicillins Rash 10/26/2015  ? ? ?Family History  ?Problem Relation Age of Onset  ? Pneumonia Mother   ? Stroke Father   ? Factor V Leiden deficiency Daughter   ? Lymphoma Daughter   ? Asthma Son   ? Allergic rhinitis Son   ? Angioedema Neg Hx   ? Eczema Neg Hx   ? Urticaria Neg Hx   ? Immunodeficiency Neg Hx   ? Colon cancer Neg Hx   ? Stomach cancer Neg Hx   ? Esophageal cancer Neg Hx   ? Colon polyps Neg Hx   ? ? ?Social History  ? ?Socioeconomic History  ? Marital status: Widowed  ?  Spouse name: Not on file  ? Number of children: 2  ?  Years of education: Not on file  ? Highest education level: High school graduate  ?Occupational History  ? Occupation: Retired  ?Tobacco Use  ? Smoking status: Never  ? Smokeless tobacco: Never  ?Vaping Use  ? Vaping Use: Never used  ?Substance and Sexual Activity  ? Alcohol use: Never  ? Drug use: Never  ? Sexual activity: Not on file  ?Other Topics Concern  ? Not on file  ?Social History Narrative  ? Lives alone  ? Right handed  ? Caffeine: occ coffee. Hot tea is decaf. Sweet tea 2 glasses a day  ? ?Social Determinants of Health  ? ?Financial Resource Strain: Not on file  ?Food Insecurity: Not on file  ?Transportation Needs: Not on file  ?Physical Activity: Not on file  ?Stress: Not on file  ?Social Connections: Not on file  ?Intimate Partner Violence: Not on file  ? ? ? ?Physical Exam: ?Ht '5\' 2"'$  (1.575 m)   Wt 138 lb (62.6 kg)   BMI 25.24 kg/m?  ?Constitutional: generally  well-appearing ?Psychiatric: alert and oriented x3 ?Eyes: extraocular movements intact ?Mouth: oral pharynx moist, no lesions ?Neck: supple no lymphadenopathy ?Cardiovascular: heart regular rate and rhythm ?Lungs: clear to auscultation bilaterally ?Abdomen: soft, nontender, nondistended, no obvious ascites, no peritoneal signs, normal bowel sounds ?Extremities: no lower extremity edema bilaterally ?Skin: no lesions on visible extremities ?Rectal exam deferred for upcoming colonoscopy ? ?Assessment and plan: ?88 y.o. female with circumferential rectal mass, newly diagnosed ? ?I recommended colonoscopy at [facility] given her frailty, very advanced age.  I will be prepared to perform endoscopic ultrasound at the same time if needed however I doubt that will be the case based on the MRI reading of what seems like a fairly typical distal rectum cancer T3N0. ? ?I offered to do this for her 10 days from now, next Thursday however she cannot make it for that and so we will look for when next available appointment. ? ?She will need to hold her Xarelto for 2 days prior and we will make sure that it is safe with the prescribing physician who is her PCP Dr. Francesco Sor ? ? ?Please see the "Patient Instructions" section for addition details about the plan. ? ? ?Owens Loffler, MD ?[facility] ?06/21/2021, 1:42 PM ? ?Cc: Sueanne Margarita, DO ? ?Total time on date of encounter was 45  minutes (this included time spent preparing to see the patient reviewing records; obtaining and/or reviewing separately obtained history; performing a medically appropriate exam and/or evaluation; counseling and educating the patient and family if present; ordering medications, tests or procedures if applicable; and documenting clinical information in the health record). ? ? ? ?

## 2021-06-21 NOTE — Telephone Encounter (Signed)
Alexandria Weaver ?16-Aug-1924 ?[id number] ?  ?Dear Francesco Sor: ?  ?We have scheduled the above named patient for a colonoscopy procedure. Our records show that (s)he is on anticoagulation therapy. ?  ?Please advise as to whether the patient may come off their therapy of Xarelto 2 days prior to their procedure which is scheduled for 08-11-2021. ?  ?Please route your response to Leanne Chang, CMA or fax response to [phone]. ?  ?Sincerely, ?  ?  ?  ?[facility] ?  ?

## 2021-06-21 NOTE — Patient Instructions (Addendum)
If you are age 86 or older, your body mass index should be between 23-30. Your Body mass index is 25.24 kg/m?Marland Kitchen If this is out of the aforementioned range listed, please consider follow up with your Primary Care Provider. ?________________________________________________________ ? ?[facility] providers would like to encourage you to use [facility] to communicate with providers for non-urgent requests or questions.  Due to long hold times on the telephone, sending your provider a message by [facility] may be a faster and more efficient way to get a response.  Please allow 48 business hours for a response.  Please remember that this is for non-urgent requests.  ?_______________________________________________________ ? ?You have been scheduled for a colonoscopy and possible lower EUS. Please follow written instructions given to you at your visit today.  ?Please pick up your prep supplies at the pharmacy within the next 1-3 days. ?If you use inhalers (even only as needed), please bring them with you on the day of your procedure. ? ?Due to recent changes in healthcare laws, you may see the results of your imaging and laboratory studies on MyChart before your provider has had a chance to review them.  We understand that in some cases there may be results that are confusing or concerning to you. Not all laboratory results come back in the same time frame and the provider may be waiting for multiple results in order to interpret others.  Please give [place] 48 hours in order for your provider to thoroughly review all the results before contacting the office for clarification of your results.  ? ?Thank you for entrusting me with your care and choosing [facility]. ? ?Dr Ardis Hughs ? ?

## 2021-07-21 DIAGNOSIS — S61412A Laceration without foreign body of left hand, initial encounter: Secondary | ICD-10-CM | POA: Diagnosis not present

## 2021-07-26 DIAGNOSIS — Z20828 Contact with and (suspected) exposure to other viral communicable diseases: Secondary | ICD-10-CM | POA: Diagnosis not present

## 2021-08-04 DIAGNOSIS — H353211 Exudative age-related macular degeneration, right eye, with active choroidal neovascularization: Secondary | ICD-10-CM | POA: Diagnosis not present

## 2021-08-04 DIAGNOSIS — H35423 Microcystoid degeneration of retina, bilateral: Secondary | ICD-10-CM | POA: Diagnosis not present

## 2021-08-04 DIAGNOSIS — H353231 Exudative age-related macular degeneration, bilateral, with active choroidal neovascularization: Secondary | ICD-10-CM | POA: Diagnosis not present

## 2021-08-05 DIAGNOSIS — M0600 Rheumatoid arthritis without rheumatoid factor, unspecified site: Secondary | ICD-10-CM | POA: Diagnosis not present

## 2021-08-08 ENCOUNTER — Encounter (HOSPITAL_COMMUNITY): Payer: Self-pay | Admitting: Gastroenterology

## 2021-08-08 NOTE — Progress Notes (Signed)
Attempted to obtain medical history via telephone, unable to reach at this time. I left a voicemail on daughters number (listed primary) to return pre surgical testing department's phone call.  ?

## 2021-08-11 ENCOUNTER — Encounter (HOSPITAL_COMMUNITY)
Admission: RE | Disposition: A | Discharge status: Home / Self Care | Payer: Self-pay | Source: Ambulatory Visit | Attending: Gastroenterology

## 2021-08-11 ENCOUNTER — Ambulatory Visit (HOSPITAL_COMMUNITY)
Admission: RE | Admit: 2021-08-11 | Discharge: 2021-08-11 | Disposition: A | Discharge status: Home / Self Care | Payer: Medicare Other | Source: Ambulatory Visit | Attending: Gastroenterology | Admitting: Gastroenterology

## 2021-08-11 ENCOUNTER — Other Ambulatory Visit: Payer: Self-pay

## 2021-08-11 ENCOUNTER — Ambulatory Visit (HOSPITAL_COMMUNITY): Payer: Medicare Other | Admitting: Anesthesiology

## 2021-08-11 ENCOUNTER — Ambulatory Visit (HOSPITAL_BASED_OUTPATIENT_CLINIC_OR_DEPARTMENT_OTHER): Payer: Medicare Other | Admitting: Anesthesiology

## 2021-08-11 ENCOUNTER — Encounter (HOSPITAL_COMMUNITY): Payer: Self-pay | Admitting: Gastroenterology

## 2021-08-11 DIAGNOSIS — I82409 Acute embolism and thrombosis of unspecified deep veins of unspecified lower extremity: Secondary | ICD-10-CM

## 2021-08-11 DIAGNOSIS — C218 Malignant neoplasm of overlapping sites of rectum, anus and anal canal: Secondary | ICD-10-CM

## 2021-08-11 DIAGNOSIS — Z86718 Personal history of other venous thrombosis and embolism: Secondary | ICD-10-CM | POA: Diagnosis not present

## 2021-08-11 DIAGNOSIS — D490 Neoplasm of unspecified behavior of digestive system: Secondary | ICD-10-CM | POA: Diagnosis not present

## 2021-08-11 DIAGNOSIS — C20 Malignant neoplasm of rectum: Secondary | ICD-10-CM | POA: Diagnosis not present

## 2021-08-11 DIAGNOSIS — K6289 Other specified diseases of anus and rectum: Secondary | ICD-10-CM | POA: Insufficient documentation

## 2021-08-11 DIAGNOSIS — M1990 Unspecified osteoarthritis, unspecified site: Secondary | ICD-10-CM

## 2021-08-11 HISTORY — PX: COLONOSCOPY WITH PROPOFOL: SHX5780

## 2021-08-11 HISTORY — PX: FINE NEEDLE ASPIRATION: SHX5430

## 2021-08-11 HISTORY — PX: EUS: SHX5427

## 2021-08-11 SURGERY — COLONOSCOPY WITH PROPOFOL
Anesthesia: Monitor Anesthesia Care

## 2021-08-11 MED ORDER — SODIUM CHLORIDE 0.9 % IV SOLN: INTRAVENOUS | Status: DC

## 2021-08-11 MED ORDER — METRONIDAZOLE 500 MG PO TABS
500.0000 mg | ORAL_TABLET | Freq: Two times a day (BID) | ORAL | 0 refills | Status: AC
Start: 2021-08-11 — End: 2021-08-16

## 2021-08-11 MED ORDER — CIPROFLOXACIN HCL 500 MG PO TABS: 500.0000 mg | ORAL_TABLET | Freq: Two times a day (BID) | ORAL | 0 refills | Status: DC

## 2021-08-11 MED ORDER — PROPOFOL 10 MG/ML IV BOLUS
INTRAVENOUS | Status: DC | PRN
  Administered 2021-08-11: 20 mg via INTRAVENOUS

## 2021-08-11 MED ORDER — LACTATED RINGERS IV SOLN: INTRAVENOUS | Status: DC | PRN

## 2021-08-11 MED ORDER — CIPROFLOXACIN IN D5W 400 MG/200ML IV SOLN
INTRAVENOUS | Status: AC
  Filled 2021-08-11: qty 200

## 2021-08-11 MED ORDER — PROPOFOL 500 MG/50ML IV EMUL
INTRAVENOUS | Status: DC | PRN
  Administered 2021-08-11: 100 ug/kg/min via INTRAVENOUS

## 2021-08-11 MED ORDER — CIPROFLOXACIN IN D5W 400 MG/200ML IV SOLN
INTRAVENOUS | Status: DC | PRN
  Administered 2021-08-11: 400 mg via INTRAVENOUS

## 2021-08-11 MED ORDER — METRONIDAZOLE 500 MG/100ML IV SOLN
500.0000 mg | Freq: Once | INTRAVENOUS | Status: AC
  Administered 2021-08-11: 500 mg via INTRAVENOUS
  Filled 2021-08-11: qty 100

## 2021-08-11 SURGICAL SUPPLY — 22 items

## 2021-08-11 NOTE — Transfer of Care (Signed)
Immediate Anesthesia Transfer of Care Note ? ?Patient: Alexandria Weaver ? ?Procedure(s) Performed: COLONOSCOPY WITH PROPOFOL ?LOWER ENDOSCOPIC ULTRASOUND (EUS) ?FINE NEEDLE ASPIRATION (FNA) LINEAR ? ?Patient Location: PACU ? ?Anesthesia Type:General ? ?Level of Consciousness: awake, alert , oriented and patient cooperative ? ?Airway & Oxygen Therapy: Patient Spontanous Breathing and Patient connected to face mask oxygen ? ?Post-op Assessment: Report given to RN, Post -op Vital signs reviewed and stable and Patient moving all extremities X 4 ? ?Post vital signs: Reviewed and stable ? ?Last Vitals:  ?Vitals Value Taken Time  ?BP    ?Temp    ?Pulse    ?Resp    ?SpO2    ? ? ?Last Pain:  ?Vitals:  ? 08/11/21 0823  ?TempSrc: Temporal  ?PainSc: 0-No pain  ?   ? ?  ? ?Complications: No notable events documented. ?

## 2021-08-11 NOTE — Anesthesia Preprocedure Evaluation (Signed)
Anesthesia Evaluation  ?Patient identified by MRN, date of birth, ID band ?Patient awake ? ? ? ?Reviewed: ?Allergy & Precautions, NPO status , Patient's Chart, lab work & pertinent test results ? ?Airway ?Mallampati: II ? ?TM Distance: >3 FB ?Neck ROM: Limited ? ? ? Dental ?no notable dental hx. ? ?  ?Pulmonary ?neg pulmonary ROS,  ?  ?Pulmonary exam normal ?breath sounds clear to auscultation ? ? ? ? ? ? Cardiovascular ?+ DVT  ?Normal cardiovascular exam ?Rhythm:Regular Rate:Normal ? ? ?  ?Neuro/Psych ?negative neurological ROS ? negative psych ROS  ? GI/Hepatic ?negative GI ROS, Neg liver ROS,   ?Endo/Other  ?negative endocrine ROS ? Renal/GU ?negative Renal ROS  ?negative genitourinary ?  ?Musculoskeletal ? ?(+) Arthritis , Osteoarthritis,   ? Abdominal ?  ?Peds ?negative pediatric ROS ?(+)  Hematology ?negative hematology ROS ?(+)   ?Anesthesia Other Findings ? ? Reproductive/Obstetrics ?negative OB ROS ? ?  ? ? ? ? ? ? ? ? ? ? ? ? ? ?  ?  ? ? ? ? ? ? ? ? ?Anesthesia Physical ?Anesthesia Plan ? ?ASA: 3 ? ?Anesthesia Plan: MAC  ? ?Post-op Pain Management: Minimal or no pain anticipated  ? ?Induction: Intravenous ? ?PONV Risk Score and Plan: 2 and Propofol infusion and Treatment may vary due to age or medical condition ? ?Airway Management Planned: Simple Face Mask ? ?Additional Equipment:  ? ?Intra-op Plan:  ? ?Post-operative Plan:  ? ?Informed Consent: I have reviewed the patients History and Physical, chart, labs and discussed the procedure including the risks, benefits and alternatives for the proposed anesthesia with the patient or authorized representative who has indicated his/her understanding and acceptance.  ? ? ? ?Dental advisory given ? ?Plan Discussed with: CRNA and Surgeon ? ?Anesthesia Plan Comments:   ? ? ? ? ? ? ?Anesthesia Quick Evaluation ? ?

## 2021-08-11 NOTE — H&P (Signed)
?HPI: ?This is a 88 yo woman with a rectal mass ? ?Referred by Dr. Johney Maine ? ?MRI March 2023 showed a 6 cm long, circumferential rectal mass laying about 2 to 3 cm from the anus MRI staging showed this to be T3N0 lesion. ? ? ?ROS: complete GI ROS as described in HPI, all other review negative. ? ?Constitutional:  No unintentional weight loss ? ? ?Past Medical History:  ?Diagnosis Date  ? Acute deep vein thrombosis (DVT) of distal end of left lower extremity ([place])   ? Anxiety   ? Arthritis   ? RA  ? Frequent nosebleeds   ? once a week  ? Mass in rectum   ? Neuropathy   ? ? ?Past Surgical History:  ?Procedure Laterality Date  ? ABDOMINAL HYSTERECTOMY    ? CATARACT EXTRACTION, BILATERAL    ? KYPHOPLASTY N/A 09/05/2019  ? Procedure: Lumbar One Kyphoplasty;  Surgeon: Ashok Pall, MD;  Location: [place];  Service: Neurosurgery;  Laterality: N/A;  3C  ? TOTAL HIP ARTHROPLASTY Left 2002  ? ? ?Current Outpatient Medications  ?Medication Instructions  ? acetaminophen (TYLENOL) 1,000 mg, Oral, Daily  ? busPIRone (BUSPAR) 5 mg, Oral, Daily  ? cholecalciferol (VITAMIN D3) 1,000 Units, Oral, Daily  ? citalopram (CELEXA) 10 mg, Oral, Daily at bedtime  ? denosumab (PROLIA) 60 mg, Subcutaneous, Every 6 months  ? folic acid (FOLVITE) 1 mg, Oral, 2 times daily, Does not take on Tuesday  ? loratadine (CLARITIN) 10 mg, Oral, Daily  ? methotrexate 25 mg, Oral, Every Tue  ? metoprolol succinate (TOPROL XL) 25 mg, Oral, Daily  ? rivaroxaban (XARELTO) 10 mg, Oral, Daily with supper  ? ? ?Allergies as of 06/21/2021 - Review Complete 06/21/2021  ?Allergen Reaction Noted  ? Doxycycline Nausea Only 10/26/2015  ? Penicillins Rash 10/26/2015  ? ? ?Family History  ?Problem Relation Age of Onset  ? Pneumonia Mother   ? Stroke Father   ? Factor V Leiden deficiency Daughter   ? Lymphoma Daughter   ? Asthma Son   ? Allergic rhinitis Son   ? Angioedema Neg Hx   ? Eczema Neg Hx   ? Urticaria Neg Hx   ? Immunodeficiency Neg Hx   ? Colon cancer Neg Hx   ?  Stomach cancer Neg Hx   ? Esophageal cancer Neg Hx   ? Colon polyps Neg Hx   ? ? ?Social History  ? ?Socioeconomic History  ? Marital status: Widowed  ?  Spouse name: Not on file  ? Number of children: 2  ? Years of education: Not on file  ? Highest education level: High school graduate  ?Occupational History  ? Occupation: Retired  ?Tobacco Use  ? Smoking status: Never  ? Smokeless tobacco: Never  ?Vaping Use  ? Vaping Use: Never used  ?Substance and Sexual Activity  ? Alcohol use: Never  ? Drug use: Never  ? Sexual activity: Not on file  ?Other Topics Concern  ? Not on file  ?Social History Narrative  ? Lives alone  ? Right handed  ? Caffeine: occ coffee. Hot tea is decaf. Sweet tea 2 glasses a day  ? ?Social Determinants of Health  ? ?Financial Resource Strain: Not on file  ?Food Insecurity: Not on file  ?Transportation Needs: Not on file  ?Physical Activity: Not on file  ?Stress: Not on file  ?Social Connections: Not on file  ?Intimate Partner Violence: Not on file  ? ? ? ?Physical Exam: ? ?Constitutional: elderly, frail ?  Psychiatric: alert and oriented x3 ?Abdomen: soft, nontender, nondistended, no obvious ascites, no peritoneal signs, normal bowel sounds ?No peripheral edema noted in lower extremities ? ?Assessment and plan: ?88 y.o. female with a rectal mass ? ?For colnoscopy +/- EUS today ? ?Please see the "Patient Instructions" section for addition details about the plan. ? ?Owens Loffler, MD ?[facility] ?08/11/2021, 8:05 AM ? ? ? ?

## 2021-08-11 NOTE — Anesthesia Postprocedure Evaluation (Signed)
Anesthesia Post Note ? ?Patient: Secret [place] ? ?Procedure(s) Performed: COLONOSCOPY WITH PROPOFOL ?LOWER ENDOSCOPIC ULTRASOUND (EUS) ?FINE NEEDLE ASPIRATION (FNA) LINEAR ? ?  ? ?Patient location during evaluation: PACU ?Anesthesia Type: MAC ?Level of consciousness: awake and alert ?Pain management: pain level controlled ?Vital Signs Assessment: post-procedure vital signs reviewed and stable ?Respiratory status: spontaneous breathing, nonlabored ventilation, respiratory function stable and patient connected to nasal cannula oxygen ?Cardiovascular status: stable and blood pressure returned to baseline ?Postop Assessment: no apparent nausea or vomiting ?Anesthetic complications: no ? ? ?No notable events documented. ? ?Last Vitals:  ?Vitals:  ? 08/11/21 1007 08/11/21 1009  ?BP: (!) 150/68 (!) 157/70  ?Pulse: 60 (!) 59  ?Resp: 16 14  ?Temp: (!) 36.1 ?C   ?SpO2: 99% 100%  ?  ?Last Pain:  ?Vitals:  ? 08/11/21 1009  ?TempSrc:   ?PainSc: 0-No pain  ? ? ?  ?  ?  ?  ?  ?  ? ?Lieutenant Abarca S ? ? ? ? ?

## 2021-08-11 NOTE — Op Note (Addendum)
[facility] ?Patient Name: Alexandria Weaver ?Procedure Date: 08/11/2021 ?MRN: [id number] ?Attending MD: Milus Banister , MD ?Date of Birth: May 12, 1924 ?CSN: [id number] ?Age: 88 ?Admit Type: Outpatient ?Procedure:                Lower EUS ?Indications:              Exclusion of internal sphincter muscle involvement  ?                          (anorectal carcinoma) ?Providers:                Milus Banister, MD, Carmie End, RN, Jacquelin Hawking  ?                          Houle, Technician ?Referring MD:             Neysa Bonito, MD ?Medicines:                Monitored Anesthesia Care ?Complications:            No immediate complications. Estimated blood loss:  ?                          None. ?Estimated Blood Loss:     Estimated blood loss: none. ?Procedure:                Pre-Anesthesia Assessment: ?                          - Prior to the procedure, a History and Physical  ?                          was performed, and patient medications and  ?                          allergies were reviewed. The patient's tolerance of  ?                          previous anesthesia was also reviewed. The risks  ?                          and benefits of the procedure and the sedation  ?                          options and risks were discussed with the patient.  ?                          All questions were answered, and informed consent  ?                          was obtained. Prior Anticoagulants: she stopped  ?                          xarelto 3 days ago. ASA Grade Assessment: III - A  ?                          patient  with severe systemic disease. After  ?                          reviewing the risks and benefits, the patient was  ?                          deemed in satisfactory condition to undergo the  ?                          procedure. ?                          After obtaining informed consent, the endoscope was  ?                          passed under direct vision. Throughout the  ?                           procedure, the patient's blood pressure, pulse, and  ?                          oxygen saturations were monitored continuously. The  ?                          [id number] ([id number]) Olympus linear ultrasound scope  ?                          was introduced through the anus and advanced to the  ?                          the splenic flexure for ultrasound. The lower EUS  ?                          was accomplished without difficulty. The patient  ?                          tolerated the procedure well. The quality of the  ?                          bowel preparation was adequate. ?Findings: ?     Endoscopic and sonographic findings : ?     There was significant anatomic abnormality in her mid and distal rectum  ?     which precluded passage of the colonoscope. There was also some pieces  ?     of solid stool within the rectum which added to this difficulty. I  ?     exchanged the colonoscope for a adult gastroscope and was able to  ?     navigate proximal to the rectum to the splenic flexure. There was  ?     significant diverticular changes in the left colon. There was no  ?     evidence of a mucosal tumor. ?     I then proceeded with radial echoendoscope evaluation and there was a  ?     clear 6.5 cm heterogeneous somewhat lobular subepithelial mass that was  ?  communicating with the muscularis propria layer of the mid to distal  ?     rectal wall. I was concerned about introducing stool into the lesion but  ?     I felt that making the diagnosis was paramount and so I then switched to  ?     linear echoendoscope and sampled the 6.5 cm mass using 2 transrectal  ?     passes with a 22-gauge acquire FNB needle. ?     During the procedure she was given IV antibiotics, Cipro 400 mg and  ?     Flagyl 500 mg. ?Impression:               - 6.5cm heterogeneous subepithelial mass  ?                          communicating with the muscularis propria layer of  ?                          the mid rectal wall. This is causing  significant  ?                          anatomic abnormality of the rectum but surprisingly  ?                          no symptoms. I sampled the mass with EUS FNB. She  ?                          was given IV cipro and flagyl during the procedure  ?                          and will complete another 5 days of each to  ?                          decrease the chances of infection. ?                          - She does not have a mucosal, circumerential tumor  ?                          as suggested by recent MRI. ?Moderate Sedation: ?     Not Applicable - Patient had care per Anesthesia. ?Recommendation:           - Discharge patient to home. ?                          - Complete the two antibiotics as above. ?                          - Await final path ?                          - She can resume her xarelto tomorrow. ?Procedure Code(s):        --- Professional --- ?  46803, Sigmoidoscopy, flexible; with  ?                          transendoscopic ultrasound guided intramural or  ?                          transmural fine needle aspiration/biopsy(s) ?Diagnosis Code(s):        --- Professional --- ?                          K62.89, Other specified diseases of anus and rectum ?                          C21.8, Malignant neoplasm of overlapping sites of  ?                          rectum, anus and anal canal ?CPT copyright 2019 American Medical Association. All rights reserved. ?The codes documented in this report are preliminary and upon coder review may  ?be revised to meet current compliance requirements. ?Milus Banister, MD ?08/11/2021 10:16:52 AM ?This report has been signed electronically. ?Number of Addenda: 0 ?

## 2021-08-11 NOTE — Discharge Instructions (Signed)
YOU HAD AN ENDOSCOPIC PROCEDURE TODAY: Refer to the procedure report and other information in the discharge instructions given to you for any specific questions about what was found during the examination. If this information does not answer your questions, please call [place] office at [phone] to clarify.  ° °YOU SHOULD EXPECT: Some feelings of bloating in the abdomen. Passage of more gas than usual. Walking can help get rid of the air that was put into your GI tract during the procedure and reduce the bloating. If you had a lower endoscopy (such as a colonoscopy or flexible sigmoidoscopy) you may notice spotting of blood in your stool or on the toilet paper. Some abdominal soreness may be present for a day or two, also. ° °DIET: Your first meal following the procedure should be a light meal and then it is ok to progress to your normal diet. A half-sandwich or bowl of soup is an example of a good first meal. Heavy or fried foods are harder to digest and may make you feel nauseous or bloated. Drink plenty of fluids but you should avoid alcoholic beverages for 24 hours. If you had a esophageal dilation, please see attached instructions for diet.   ° °ACTIVITY: Your care partner should take you home directly after the procedure. You should plan to take it easy, moving slowly for the rest of the day. You can resume normal activity the day after the procedure however YOU SHOULD NOT DRIVE, use power tools, machinery or perform tasks that involve climbing or major physical exertion for 24 hours (because of the sedation medicines used during the test).  ° °SYMPTOMS TO REPORT IMMEDIATELY: °A gastroenterologist can be reached at any hour. Please call [phone]  for any of the following symptoms:  °Following lower endoscopy (colonoscopy, flexible sigmoidoscopy) °Excessive amounts of blood in the stool  °Significant tenderness, worsening of abdominal pains  °Swelling of the abdomen that is new, acute  °Fever of 100° or  higher  °Following upper endoscopy (EGD, EUS, ERCP, esophageal dilation) °Vomiting of blood or coffee ground material  °New, significant abdominal pain  °New, significant chest pain or pain under the shoulder blades  °Painful or persistently difficult swallowing  °New shortness of breath  °Black, tarry-looking or red, bloody stools ° °FOLLOW UP:  °If any biopsies were taken you will be contacted by phone or by letter within the next 1-3 weeks. Call [phone]  if you have not heard about the biopsies in 3 weeks.  °Please also call with any specific questions about appointments or follow up tests. ° °

## 2021-08-12 ENCOUNTER — Encounter (HOSPITAL_COMMUNITY): Payer: Self-pay | Admitting: Gastroenterology

## 2021-08-15 ENCOUNTER — Other Ambulatory Visit: Payer: Self-pay

## 2021-08-15 DIAGNOSIS — C49A0 Gastrointestinal stromal tumor, unspecified site: Secondary | ICD-10-CM

## 2021-08-15 LAB — CYTOLOGY - NON PAP

## 2021-08-16 ENCOUNTER — Encounter: Payer: Self-pay | Admitting: *Deleted

## 2021-08-16 NOTE — Progress Notes (Signed)
PATIENT NAVIGATOR PROGRESS NOTE ? ?Name: Alexandria Weaver ?Date: 08/16/2021 ?MRN: [id number]  ?DOB: 1925/01/10 ? ? ?Reason for visit:  ?New patient appt ? ?Comments:   ?Called and spoke with [name] in regard to a new patient appt with Dr Benay Spice. Scheduled for 5/24 at 1:10 pm, directions and parking reviewed as well as mask and one support person allowed policy ? ? ?Time spent counseling/coordinating care: 15-30 minutes ? ?

## 2021-08-24 ENCOUNTER — Other Ambulatory Visit: Payer: Self-pay

## 2021-08-24 NOTE — Progress Notes (Signed)
The proposed treatment discussed in conference is for discussion purpose only and is not a binding recommendation.  The patients have not been physically examined, or presented with their treatment options.  Therefore, final treatment plans cannot be decided.  

## 2021-08-29 DIAGNOSIS — I739 Peripheral vascular disease, unspecified: Secondary | ICD-10-CM | POA: Diagnosis not present

## 2021-08-31 ENCOUNTER — Inpatient Hospital Stay: Payer: Medicare Other | Attending: Oncology | Admitting: Oncology

## 2021-08-31 ENCOUNTER — Encounter: Payer: Self-pay | Admitting: *Deleted

## 2021-08-31 VITALS — BP 140/56 | HR 66 | Temp 98.2°F | Resp 18 | Ht 62.0 in | Wt 134.1 lb

## 2021-08-31 DIAGNOSIS — Z86718 Personal history of other venous thrombosis and embolism: Secondary | ICD-10-CM | POA: Diagnosis not present

## 2021-08-31 DIAGNOSIS — M069 Rheumatoid arthritis, unspecified: Secondary | ICD-10-CM

## 2021-08-31 DIAGNOSIS — H3530 Unspecified macular degeneration: Secondary | ICD-10-CM

## 2021-08-31 DIAGNOSIS — Z7901 Long term (current) use of anticoagulants: Secondary | ICD-10-CM | POA: Diagnosis not present

## 2021-08-31 DIAGNOSIS — C49A5 Gastrointestinal stromal tumor of rectum: Secondary | ICD-10-CM | POA: Diagnosis not present

## 2021-08-31 MED ORDER — IMATINIB MESYLATE 100 MG PO TABS
200.0000 mg | ORAL_TABLET | Freq: Every day | ORAL | 0 refills | Status: DC
  Filled 2021-08-31 – 2021-09-02 (×2): qty 60, 30d supply, fill #0

## 2021-08-31 NOTE — Progress Notes (Signed)
[name]   Requesting MD: Milus Banister, Md [address],  [place] [address]   Alexandria Weaver 88 y.o.  01-21-1925    Reason for Consult: Gastrointestinal stromal tumor of the rectum   HPI: Alexandria Weaver was seen in the emergency room at  [facility] on 02/13/2021 with presyncope and diarrhea.  A CT abdomen and pelvis revealed a low-attenuation lesion in the left lateral wall of the inferior rectum and anal canal measuring 4.7 x 4.4 cm.  No pathologic enlarged lymph nodes. She was referred to Dr. Johney Maine on 05/09/2021.  A large submucosal mass was noted.  The mass was have circumferential and nearly obstruct the rectum.  The mass appeared fixed to the posterior rectal wall, but not the sphincters. A pelvic MRI 06/14/2021 confirmed a tumor at 2.3 cm from the anal verge.  The tumor is circumferential and measured 5.7 x 5.3 cm.  There is mass effect upon the vagina.  There is gross involvement of the left-sided sphincters with tumor extending lateral to the levator muscles.  No enlarged lymph nodes.  She was referred to Dr. Ardis Hughs and underwent a lower EUS on 08/11/2021.  A 6.5 cm heterogenous lobular subepithelial mass was noted to communicate with the muscularis propria of the mid to distal rectum.  The mass was biopsied. The pathology confirmed a spindle cell proliferation consistent with a gastrointestinal stromal tumor.  The neoplastic cells stain positive for CD117 and CD34.  The cells are negative for CD56, chromogranin, synaptophysin, and cytokeratin AE1/AE3.  There is a high Ki-67 perforation rate consistent with a high-grade. Her case was presented at the GI tumor conference.  She is felt to not be a surgical candidate.  She is referred to consider systemic therapy. She feels well.  No rectal pain, bleeding, or constipation.   Past Medical History:  Diagnosis Date   Acute deep vein thrombosis (DVT) of distal end of left lower extremity ([facility]) 2  occasions-maintained on chronic anticoagulation    Anxiety    Arthritis    RA   Frequent nosebleeds    once a week   Gastrointestinal stromal tumor of the rectum    Neuropathy     .  Glaucoma   .  Macular degeneration   .  G6, P2, miscarriage 4   .  COVID-19 twice  Past Surgical History:  Procedure Laterality Date   ABDOMINAL HYSTERECTOMY     CATARACT EXTRACTION, BILATERAL     COLONOSCOPY WITH PROPOFOL N/A 08/11/2021   Procedure: COLONOSCOPY WITH PROPOFOL;  Surgeon: Milus Banister, MD;  Location: [facility];  Service: Gastroenterology;  Laterality: N/A;   EUS N/A 08/11/2021   Procedure: LOWER ENDOSCOPIC ULTRASOUND (EUS);  Surgeon: Milus Banister, MD;  Location: [facility];  Service: Gastroenterology;  Laterality: N/A;   FINE NEEDLE ASPIRATION N/A 08/11/2021   Procedure: FINE NEEDLE ASPIRATION (FNA) LINEAR;  Surgeon: Milus Banister, MD;  Location: [facility];  Service: Gastroenterology;  Laterality: N/A;   KYPHOPLASTY N/A 09/05/2019   Procedure: Lumbar One Kyphoplasty;  Surgeon: Ashok Pall, MD;  Location: [place];  Service: Neurosurgery;  Laterality: N/A;  3C   TOTAL HIP ARTHROPLASTY Left 2002    Medications: Reviewed  Allergies:  Allergies  Allergen Reactions   Doxycycline Nausea Only   Leflunomide Diarrhea   Penicillins Rash    Family history: No family history of cancer  Social History:   She lives alone in [place].  She does not use  cigarettes or alcohol.  No transfusion history.  No risk factor for hepatitis.  ROS:   Positives include: Head and neck sweats at night, hurts "all over "after a fall on 08/29/2021, arthralgias  A complete ROS was otherwise negative.  Physical Exam:  Blood pressure (!) 140/56, pulse 66, temperature 98.2 F (36.8 C), temperature source Oral, resp. rate 18, height 5' 2"  (1.575 m), weight 134 lb 1.6 oz (60.8 kg), SpO2 98 %.  HEENT: Neck without mass Lungs: Clear bilaterally Cardiac: Regular rate and rhythm Abdomen: No  hepatosplenomegaly, nontender, no mass Rectal: There is a smooth mass occupying the posterior rectum Vascular: No leg edema Lymph nodes: No cervical, supraclavicular, axillary, or inguinal nodes Neurologic: Alert and oriented, the motor exam appears intact in the upper and lower extremities bilaterally Skin: No rash Musculoskeletal: No spine tenderness   LAB:  CBC  Lab Results  Component Value Date   WBC 8.3 09/04/2019   HGB 13.2 09/04/2019   HCT 41.6 09/04/2019   MCV 92.2 09/04/2019   PLT 317 09/04/2019   NEUTROABS 7.0 08/20/2017        CMP  Lab Results  Component Value Date   NA 137 09/04/2019   K 4.3 09/04/2019   CL 99 09/04/2019   CO2 29 09/04/2019   GLUCOSE 92 09/04/2019   BUN 19 09/04/2019   CREATININE 0.86 09/04/2019   CALCIUM 9.1 09/04/2019   PROT 7.2 08/20/2017   ALBUMIN 3.6 08/20/2017   AST 18 08/20/2017   ALT 11 (L) 08/20/2017   ALKPHOS 53 08/20/2017   BILITOT 0.9 08/20/2017   GFRNONAA 59 (L) 09/04/2019   GFRAA >60 09/04/2019     No results found for: CEA1, OIL579, CA125  Imaging: As per HPI, MRI pelvis 06/14/2021-images reviewed with Alexandria Weaver and her daughter    Assessment/Plan:   Gastrointestinal stromal tumor of the rectum CT abdomen/pelvis 02/14/2019 22-4.7 cm low-attenuation lesion in the left lateral wall of the inferior rectum and anal canal MRI pelvis 06/15/2018 23-6 x 5.7 x 5.3 cm anorectal mass with involvement of the left-sided sphincter, no evidence of nodal metastases EUS 08/12/2018 23-6.5 cm heterogenous subepithelial mass communicating with the muscularis propria layer of the mid rectal wall, biopsy-gastrointestinal stromal tumor, high-grade History of recurrent deep vein thrombosis-maintained on chronic anticoagulation Rheumatoid arthritis-maintained on weekly methotrexate Glaucoma Macular degeneration   Disposition:   Alexandria Weaver has been diagnosed with a gastrointestinal stromal tumor of the rectum.  She does not have  significant symptoms from the large rectal mass at present.  She is not a surgical candidate.  We discussed treatment options including observation.  I am concerned she will develop symptoms soon given the large rectal mass and high-grade histology.  I recommend treatment with imatinib.  We reviewed potential toxicities associated with imatinib including the chance of hematologic toxicity, rash, diarrhea, edema, and cardiac toxicity.  She agrees to proceed with a trial of imatinib.  The plan is to begin imatinib at a dose of 200 mg daily beginning 09/12/2021.  She will return for an office and lab visit on 09/26/2021.    Alexandria Coder, MD  08/31/2021, 7:46 PM

## 2021-08-31 NOTE — Progress Notes (Unsigned)
PATIENT NAVIGATOR PROGRESS NOTE  Name: Alexandria Weaver Date: 08/31/2021 MRN: [id number]  DOB: 06-17-1924   Reason for visit:  Initial visit with Dr Benay Spice  Comments:  Met with [name] and her daughter [name] during visit with Dr Benay Spice GIST treatment discussed versus surveillance and decided to initiate Imatinib(Gleevec therapy)  Written information given after discussion with Dr Benay Spice After 3-4 months of therapy she will have repeat CT Given contact information for navigator, pharmacist and pt advocate with pharmacist Will contact for any issues or questions    Time spent counseling/coordinating care: > 60 minutes

## 2021-09-01 ENCOUNTER — Telehealth: Payer: Self-pay | Admitting: Pharmacy Technician

## 2021-09-01 ENCOUNTER — Other Ambulatory Visit (HOSPITAL_COMMUNITY): Payer: Self-pay

## 2021-09-01 NOTE — Telephone Encounter (Signed)
Oral Oncology Patient Advocate Encounter   Received notification from [employer] that prior authorization for Imatinib is required.   PA submitted on CoverMyMeds Key [id number] Status is pending   Oral Oncology Clinic will continue to follow.  [place] Patient [place] Phone [phone] Fax [phone] 09/01/2021 10:37 AM

## 2021-09-01 NOTE — Telephone Encounter (Signed)
Oral Oncology Patient Advocate Encounter  Prior Authorization for Imatinib has been approved.    PA# [id number] Effective dates: 09/01/21 through 04/09/22.  Patients co-pay is $70.34.  Oral Oncology Clinic will continue to follow.   [place] Patient [place] Phone [phone] Fax [phone] 09/01/2021 10:39 AM

## 2021-09-02 ENCOUNTER — Telehealth: Payer: Self-pay | Admitting: Pharmacist

## 2021-09-02 ENCOUNTER — Other Ambulatory Visit (HOSPITAL_COMMUNITY): Payer: Self-pay

## 2021-09-02 NOTE — Telephone Encounter (Signed)
Oral Chemotherapy Pharmacist Encounter  [facility] will deliver medication on 09/07/21. Cathie Beams knows the plan is for her mom to get started on 09/12/21.  Patient Education I spoke with patient's daughter Cathie Beams for overview of new oral chemotherapy medication: Gleevec (imatinib) for the treatment of GIST (non-surgical candidate), planned duration until disease progression or unacceptable drug toxicity. Planned start 09/12/21.   Counseled Glenna on administration, dosing, side effects, monitoring, drug-food interactions, safe handling, storage, and disposal. Patient will take 2 tablets (200 mg total) by mouth daily. Take with meals and large glass of water.  Side effects include but not limited to: edema, diarrhea, nausea, rash/itchy, fatigue, decreased wbc/hgb/plt.    Reviewed with Cathie Beams importance of keeping a medication schedule and plan for any missed doses.  After discussion with Glenna, no patient barriers to medication adherence identified.   [place] voiced understanding and appreciation. All questions answered. Medication handout provided.  Provided Glenna with Oral Chemotherapy Navigation Clinic phone number. Cathie Beams knows to call the office with questions or concerns. Oral Chemotherapy Navigation Clinic will continue to follow.  Darl Pikes, PharmD, BCPS, BCOP, CPP Hematology/Oncology Clinical Pharmacist Practitioner [place]/[facility]/AP Oral [facility] [phone]  09/02/2021 1:58 PM

## 2021-09-02 NOTE — Telephone Encounter (Signed)
Oral Oncology Pharmacist Encounter  Received new prescription for Gleevec (imatinib) for the treatment of GIST (non-surgical candidate), planned duration until disease progression or unacceptable drug toxicity. Planned start 09/12/21.  No recent CMP/CBC, lab orders ended for future labs checks. No renal of hepatic noted on 02/2021 labs in [place]. Prescription dose and frequency assessed.   Current medication list in Epic reviewed, a few relevant DDIs with imatinib identified: Buspirone: imatinib may increase the serum concentration of Buspirone. Monitor for increased buspirone adverse effects. No baseline dose adjustment needed. Rivaroxaban: imatinib may increase the serum concentration of rivaroxaban. Monitor for increased bleeding if rivaroxaban. No baseline dose adjustment needed.   Evaluated chart and no patient barriers to medication adherence identified.   Prescription has been e-scribed to the [facility] for benefits analysis and approval.  Oral Oncology Clinic will continue to follow for insurance authorization, copayment issues, initial counseling and start date.  Patient agreed to treatment on 08/31/21 per MD documentation.  Darl Pikes, PharmD, BCPS, BCOP, CPP Hematology/Oncology Clinical Pharmacist Practitioner [place]/DB/AP [facility] [phone]  09/02/2021 11:29 AM

## 2021-09-06 NOTE — Telephone Encounter (Signed)
Patients daughter is going to have the first fill processed through [facility] and will look into [email or web address] for more cost savings on Imatinib for the patient.  [place] Patient [place] Phone [phone] Fax [phone] 09/06/2021 8:43 AM

## 2021-09-07 DIAGNOSIS — S0990XA Unspecified injury of head, initial encounter: Secondary | ICD-10-CM | POA: Diagnosis not present

## 2021-09-07 DIAGNOSIS — I10 Essential (primary) hypertension: Secondary | ICD-10-CM | POA: Diagnosis not present

## 2021-09-07 DIAGNOSIS — R58 Hemorrhage, not elsewhere classified: Secondary | ICD-10-CM | POA: Diagnosis not present

## 2021-09-07 DIAGNOSIS — W19XXXA Unspecified fall, initial encounter: Secondary | ICD-10-CM | POA: Diagnosis not present

## 2021-09-08 DIAGNOSIS — C49A0 Gastrointestinal stromal tumor, unspecified site: Secondary | ICD-10-CM | POA: Diagnosis not present

## 2021-09-08 DIAGNOSIS — M5030 Other cervical disc degeneration, unspecified cervical region: Secondary | ICD-10-CM | POA: Diagnosis not present

## 2021-09-08 DIAGNOSIS — C49A5 Gastrointestinal stromal tumor of rectum: Secondary | ICD-10-CM | POA: Diagnosis not present

## 2021-09-08 DIAGNOSIS — S22039D Unspecified fracture of third thoracic vertebra, subsequent encounter for fracture with routine healing: Secondary | ICD-10-CM | POA: Diagnosis not present

## 2021-09-08 DIAGNOSIS — S0101XD Laceration without foreign body of scalp, subsequent encounter: Secondary | ICD-10-CM | POA: Diagnosis not present

## 2021-09-08 DIAGNOSIS — S06890A Other specified intracranial injury without loss of consciousness, initial encounter: Secondary | ICD-10-CM | POA: Diagnosis not present

## 2021-09-08 DIAGNOSIS — S0101XA Laceration without foreign body of scalp, initial encounter: Secondary | ICD-10-CM | POA: Diagnosis not present

## 2021-09-08 DIAGNOSIS — Z7901 Long term (current) use of anticoagulants: Secondary | ICD-10-CM | POA: Diagnosis not present

## 2021-09-08 DIAGNOSIS — Z86718 Personal history of other venous thrombosis and embolism: Secondary | ICD-10-CM | POA: Diagnosis not present

## 2021-09-08 DIAGNOSIS — Z96642 Presence of left artificial hip joint: Secondary | ICD-10-CM | POA: Diagnosis not present

## 2021-09-08 DIAGNOSIS — F32A Depression, unspecified: Secondary | ICD-10-CM | POA: Diagnosis not present

## 2021-09-08 DIAGNOSIS — R1319 Other dysphagia: Secondary | ICD-10-CM | POA: Diagnosis not present

## 2021-09-08 DIAGNOSIS — M1611 Unilateral primary osteoarthritis, right hip: Secondary | ICD-10-CM | POA: Diagnosis not present

## 2021-09-08 DIAGNOSIS — M898X8 Other specified disorders of bone, other site: Secondary | ICD-10-CM | POA: Diagnosis not present

## 2021-09-08 DIAGNOSIS — R2689 Other abnormalities of gait and mobility: Secondary | ICD-10-CM | POA: Diagnosis not present

## 2021-09-08 DIAGNOSIS — Z8673 Personal history of transient ischemic attack (TIA), and cerebral infarction without residual deficits: Secondary | ICD-10-CM | POA: Diagnosis not present

## 2021-09-08 DIAGNOSIS — W19XXXA Unspecified fall, initial encounter: Secondary | ICD-10-CM | POA: Diagnosis not present

## 2021-09-08 DIAGNOSIS — W01198A Fall on same level from slipping, tripping and stumbling with subsequent striking against other object, initial encounter: Secondary | ICD-10-CM | POA: Diagnosis not present

## 2021-09-08 DIAGNOSIS — Z66 Do not resuscitate: Secondary | ICD-10-CM | POA: Diagnosis not present

## 2021-09-08 DIAGNOSIS — M6281 Muscle weakness (generalized): Secondary | ICD-10-CM | POA: Diagnosis not present

## 2021-09-08 DIAGNOSIS — M5031 Other cervical disc degeneration,  high cervical region: Secondary | ICD-10-CM | POA: Diagnosis not present

## 2021-09-08 DIAGNOSIS — Z23 Encounter for immunization: Secondary | ICD-10-CM | POA: Diagnosis not present

## 2021-09-08 DIAGNOSIS — S065X0A Traumatic subdural hemorrhage without loss of consciousness, initial encounter: Secondary | ICD-10-CM | POA: Diagnosis not present

## 2021-09-08 DIAGNOSIS — R262 Difficulty in walking, not elsewhere classified: Secondary | ICD-10-CM | POA: Diagnosis not present

## 2021-09-08 DIAGNOSIS — Z880 Allergy status to penicillin: Secondary | ICD-10-CM | POA: Diagnosis not present

## 2021-09-08 DIAGNOSIS — I10 Essential (primary) hypertension: Secondary | ICD-10-CM | POA: Diagnosis not present

## 2021-09-08 DIAGNOSIS — Z9181 History of falling: Secondary | ICD-10-CM | POA: Diagnosis not present

## 2021-09-08 DIAGNOSIS — G629 Polyneuropathy, unspecified: Secondary | ICD-10-CM | POA: Diagnosis not present

## 2021-09-08 DIAGNOSIS — R296 Repeated falls: Secondary | ICD-10-CM | POA: Diagnosis not present

## 2021-09-08 DIAGNOSIS — S22088A Other fracture of T11-T12 vertebra, initial encounter for closed fracture: Secondary | ICD-10-CM | POA: Diagnosis not present

## 2021-09-08 DIAGNOSIS — Z79899 Other long term (current) drug therapy: Secondary | ICD-10-CM | POA: Diagnosis not present

## 2021-09-08 DIAGNOSIS — M069 Rheumatoid arthritis, unspecified: Secondary | ICD-10-CM | POA: Diagnosis not present

## 2021-09-08 DIAGNOSIS — S066X0A Traumatic subarachnoid hemorrhage without loss of consciousness, initial encounter: Secondary | ICD-10-CM | POA: Diagnosis not present

## 2021-09-08 DIAGNOSIS — H9190 Unspecified hearing loss, unspecified ear: Secondary | ICD-10-CM | POA: Diagnosis not present

## 2021-09-08 DIAGNOSIS — Z7189 Other specified counseling: Secondary | ICD-10-CM | POA: Diagnosis not present

## 2021-09-08 DIAGNOSIS — I959 Hypotension, unspecified: Secondary | ICD-10-CM | POA: Diagnosis not present

## 2021-09-08 DIAGNOSIS — R402143 Coma scale, eyes open, spontaneous, at hospital admission: Secondary | ICD-10-CM | POA: Diagnosis not present

## 2021-09-08 DIAGNOSIS — M150 Primary generalized (osteo)arthritis: Secondary | ICD-10-CM | POA: Diagnosis not present

## 2021-09-08 DIAGNOSIS — Y998 Other external cause status: Secondary | ICD-10-CM | POA: Diagnosis not present

## 2021-09-08 DIAGNOSIS — S065XAA Traumatic subdural hemorrhage with loss of consciousness status unknown, initial encounter: Secondary | ICD-10-CM | POA: Diagnosis not present

## 2021-09-08 DIAGNOSIS — E871 Hypo-osmolality and hyponatremia: Secondary | ICD-10-CM | POA: Diagnosis not present

## 2021-09-08 DIAGNOSIS — E441 Mild protein-calorie malnutrition: Secondary | ICD-10-CM | POA: Diagnosis not present

## 2021-09-08 DIAGNOSIS — W1839XA Other fall on same level, initial encounter: Secondary | ICD-10-CM | POA: Diagnosis not present

## 2021-09-08 DIAGNOSIS — F419 Anxiety disorder, unspecified: Secondary | ICD-10-CM | POA: Diagnosis not present

## 2021-09-08 DIAGNOSIS — I6200 Nontraumatic subdural hemorrhage, unspecified: Secondary | ICD-10-CM | POA: Diagnosis not present

## 2021-09-08 DIAGNOSIS — I517 Cardiomegaly: Secondary | ICD-10-CM | POA: Diagnosis not present

## 2021-09-08 DIAGNOSIS — R0689 Other abnormalities of breathing: Secondary | ICD-10-CM | POA: Diagnosis not present

## 2021-09-08 DIAGNOSIS — R531 Weakness: Secondary | ICD-10-CM | POA: Diagnosis not present

## 2021-09-08 DIAGNOSIS — R402363 Coma scale, best motor response, obeys commands, at hospital admission: Secondary | ICD-10-CM | POA: Diagnosis not present

## 2021-09-08 DIAGNOSIS — F411 Generalized anxiety disorder: Secondary | ICD-10-CM | POA: Diagnosis not present

## 2021-09-08 DIAGNOSIS — Z9189 Other specified personal risk factors, not elsewhere classified: Secondary | ICD-10-CM | POA: Diagnosis not present

## 2021-09-08 DIAGNOSIS — R402253 Coma scale, best verbal response, oriented, at hospital admission: Secondary | ICD-10-CM | POA: Diagnosis not present

## 2021-09-08 DIAGNOSIS — H3530 Unspecified macular degeneration: Secondary | ICD-10-CM | POA: Diagnosis not present

## 2021-09-08 DIAGNOSIS — Z743 Need for continuous supervision: Secondary | ICD-10-CM | POA: Diagnosis not present

## 2021-09-08 DIAGNOSIS — Z043 Encounter for examination and observation following other accident: Secondary | ICD-10-CM | POA: Diagnosis not present

## 2021-09-12 ENCOUNTER — Encounter: Payer: Self-pay | Admitting: *Deleted

## 2021-09-12 NOTE — Progress Notes (Signed)
PATIENT NAVIGATOR PROGRESS NOTE  Name: Alexandria Weaver Date: 09/12/2021 MRN: [id number]  DOB: 02-22-1925   Reason for visit:  Telephone call with Lynden Ang, daughter  Comments:  Spoke with [name] regarding [name], she had a fall at home and is currently admitted to [facility] with injuries from fall and is expected to go to rehab.  She is not starting [place] therapy at this time.  Will continue to follow     Time spent counseling/coordinating care: 30-45 minutes

## 2021-09-13 DIAGNOSIS — Z743 Need for continuous supervision: Secondary | ICD-10-CM | POA: Diagnosis not present

## 2021-09-13 DIAGNOSIS — S22039D Unspecified fracture of third thoracic vertebra, subsequent encounter for fracture with routine healing: Secondary | ICD-10-CM | POA: Diagnosis not present

## 2021-09-13 DIAGNOSIS — H9190 Unspecified hearing loss, unspecified ear: Secondary | ICD-10-CM | POA: Diagnosis not present

## 2021-09-13 DIAGNOSIS — S0101XA Laceration without foreign body of scalp, initial encounter: Secondary | ICD-10-CM | POA: Diagnosis not present

## 2021-09-13 DIAGNOSIS — E538 Deficiency of other specified B group vitamins: Secondary | ICD-10-CM | POA: Diagnosis not present

## 2021-09-13 DIAGNOSIS — Z7189 Other specified counseling: Secondary | ICD-10-CM | POA: Diagnosis not present

## 2021-09-13 DIAGNOSIS — R1319 Other dysphagia: Secondary | ICD-10-CM | POA: Diagnosis not present

## 2021-09-13 DIAGNOSIS — G629 Polyneuropathy, unspecified: Secondary | ICD-10-CM | POA: Diagnosis not present

## 2021-09-13 DIAGNOSIS — E559 Vitamin D deficiency, unspecified: Secondary | ICD-10-CM | POA: Diagnosis not present

## 2021-09-13 DIAGNOSIS — D519 Vitamin B12 deficiency anemia, unspecified: Secondary | ICD-10-CM | POA: Diagnosis not present

## 2021-09-13 DIAGNOSIS — S065XAA Traumatic subdural hemorrhage with loss of consciousness status unknown, initial encounter: Secondary | ICD-10-CM | POA: Diagnosis not present

## 2021-09-13 DIAGNOSIS — S0101XD Laceration without foreign body of scalp, subsequent encounter: Secondary | ICD-10-CM | POA: Diagnosis not present

## 2021-09-13 DIAGNOSIS — E441 Mild protein-calorie malnutrition: Secondary | ICD-10-CM | POA: Diagnosis not present

## 2021-09-13 DIAGNOSIS — R5381 Other malaise: Secondary | ICD-10-CM | POA: Diagnosis not present

## 2021-09-13 DIAGNOSIS — I959 Hypotension, unspecified: Secondary | ICD-10-CM | POA: Diagnosis not present

## 2021-09-13 DIAGNOSIS — H3530 Unspecified macular degeneration: Secondary | ICD-10-CM | POA: Diagnosis not present

## 2021-09-13 DIAGNOSIS — M6281 Muscle weakness (generalized): Secondary | ICD-10-CM | POA: Diagnosis not present

## 2021-09-13 DIAGNOSIS — R531 Weakness: Secondary | ICD-10-CM | POA: Diagnosis not present

## 2021-09-13 DIAGNOSIS — F411 Generalized anxiety disorder: Secondary | ICD-10-CM | POA: Diagnosis not present

## 2021-09-13 DIAGNOSIS — M150 Primary generalized (osteo)arthritis: Secondary | ICD-10-CM | POA: Diagnosis not present

## 2021-09-13 DIAGNOSIS — R262 Difficulty in walking, not elsewhere classified: Secondary | ICD-10-CM | POA: Diagnosis not present

## 2021-09-13 DIAGNOSIS — S065X0A Traumatic subdural hemorrhage without loss of consciousness, initial encounter: Secondary | ICD-10-CM | POA: Diagnosis not present

## 2021-09-13 DIAGNOSIS — W19XXXA Unspecified fall, initial encounter: Secondary | ICD-10-CM | POA: Diagnosis not present

## 2021-09-13 DIAGNOSIS — I6200 Nontraumatic subdural hemorrhage, unspecified: Secondary | ICD-10-CM | POA: Diagnosis not present

## 2021-09-14 DIAGNOSIS — S065XAA Traumatic subdural hemorrhage with loss of consciousness status unknown, initial encounter: Secondary | ICD-10-CM | POA: Diagnosis not present

## 2021-09-14 DIAGNOSIS — F411 Generalized anxiety disorder: Secondary | ICD-10-CM | POA: Diagnosis not present

## 2021-09-14 DIAGNOSIS — R5381 Other malaise: Secondary | ICD-10-CM | POA: Diagnosis not present

## 2021-09-14 DIAGNOSIS — S0101XD Laceration without foreign body of scalp, subsequent encounter: Secondary | ICD-10-CM | POA: Diagnosis not present

## 2021-09-19 ENCOUNTER — Other Ambulatory Visit (HOSPITAL_COMMUNITY): Payer: Self-pay

## 2021-09-19 DIAGNOSIS — E538 Deficiency of other specified B group vitamins: Secondary | ICD-10-CM | POA: Diagnosis not present

## 2021-09-19 DIAGNOSIS — R5381 Other malaise: Secondary | ICD-10-CM | POA: Diagnosis not present

## 2021-09-19 DIAGNOSIS — Z7189 Other specified counseling: Secondary | ICD-10-CM | POA: Diagnosis not present

## 2021-09-19 DIAGNOSIS — E559 Vitamin D deficiency, unspecified: Secondary | ICD-10-CM | POA: Diagnosis not present

## 2021-09-19 DIAGNOSIS — D519 Vitamin B12 deficiency anemia, unspecified: Secondary | ICD-10-CM | POA: Diagnosis not present

## 2021-09-20 ENCOUNTER — Other Ambulatory Visit (HOSPITAL_COMMUNITY): Payer: Self-pay

## 2021-09-20 DIAGNOSIS — R5381 Other malaise: Secondary | ICD-10-CM | POA: Diagnosis not present

## 2021-09-20 DIAGNOSIS — S065XAA Traumatic subdural hemorrhage with loss of consciousness status unknown, initial encounter: Secondary | ICD-10-CM | POA: Diagnosis not present

## 2021-09-26 ENCOUNTER — Other Ambulatory Visit: Payer: Medicare Other

## 2021-09-26 ENCOUNTER — Ambulatory Visit: Payer: Medicare Other | Admitting: Nurse Practitioner

## 2021-09-28 ENCOUNTER — Other Ambulatory Visit (HOSPITAL_COMMUNITY): Payer: Self-pay

## 2021-09-29 DIAGNOSIS — R5381 Other malaise: Secondary | ICD-10-CM | POA: Diagnosis not present

## 2021-09-29 DIAGNOSIS — S065XAA Traumatic subdural hemorrhage with loss of consciousness status unknown, initial encounter: Secondary | ICD-10-CM | POA: Diagnosis not present

## 2021-09-29 DIAGNOSIS — G629 Polyneuropathy, unspecified: Secondary | ICD-10-CM | POA: Diagnosis not present

## 2021-09-29 DIAGNOSIS — F411 Generalized anxiety disorder: Secondary | ICD-10-CM | POA: Diagnosis not present

## 2021-10-03 DIAGNOSIS — R0989 Other specified symptoms and signs involving the circulatory and respiratory systems: Secondary | ICD-10-CM | POA: Diagnosis not present

## 2021-10-03 DIAGNOSIS — M0600 Rheumatoid arthritis without rheumatoid factor, unspecified site: Secondary | ICD-10-CM | POA: Diagnosis not present

## 2021-10-03 DIAGNOSIS — D519 Vitamin B12 deficiency anemia, unspecified: Secondary | ICD-10-CM | POA: Diagnosis not present

## 2021-10-03 DIAGNOSIS — R55 Syncope and collapse: Secondary | ICD-10-CM | POA: Diagnosis not present

## 2021-10-03 DIAGNOSIS — I951 Orthostatic hypotension: Secondary | ICD-10-CM | POA: Diagnosis not present

## 2021-10-03 DIAGNOSIS — M810 Age-related osteoporosis without current pathological fracture: Secondary | ICD-10-CM | POA: Diagnosis not present

## 2021-10-03 DIAGNOSIS — E46 Unspecified protein-calorie malnutrition: Secondary | ICD-10-CM | POA: Diagnosis not present

## 2021-10-03 DIAGNOSIS — R7303 Prediabetes: Secondary | ICD-10-CM | POA: Diagnosis not present

## 2021-10-03 DIAGNOSIS — Z86718 Personal history of other venous thrombosis and embolism: Secondary | ICD-10-CM | POA: Diagnosis not present

## 2021-10-03 DIAGNOSIS — E782 Mixed hyperlipidemia: Secondary | ICD-10-CM | POA: Diagnosis not present

## 2021-10-03 DIAGNOSIS — Z7409 Other reduced mobility: Secondary | ICD-10-CM | POA: Diagnosis not present

## 2021-10-03 DIAGNOSIS — D6869 Other thrombophilia: Secondary | ICD-10-CM | POA: Diagnosis not present

## 2021-10-03 DIAGNOSIS — K629 Disease of anus and rectum, unspecified: Secondary | ICD-10-CM | POA: Diagnosis not present

## 2021-10-03 DIAGNOSIS — I5031 Acute diastolic (congestive) heart failure: Secondary | ICD-10-CM | POA: Diagnosis not present

## 2021-10-03 DIAGNOSIS — I700 Atherosclerosis of aorta: Secondary | ICD-10-CM | POA: Diagnosis not present

## 2021-10-03 DIAGNOSIS — F419 Anxiety disorder, unspecified: Secondary | ICD-10-CM | POA: Diagnosis not present

## 2021-10-03 DIAGNOSIS — R2681 Unsteadiness on feet: Secondary | ICD-10-CM | POA: Diagnosis not present

## 2021-10-03 DIAGNOSIS — W1830XD Fall on same level, unspecified, subsequent encounter: Secondary | ICD-10-CM | POA: Diagnosis not present

## 2021-10-05 DIAGNOSIS — Z7409 Other reduced mobility: Secondary | ICD-10-CM | POA: Diagnosis not present

## 2021-10-05 DIAGNOSIS — I951 Orthostatic hypotension: Secondary | ICD-10-CM | POA: Diagnosis not present

## 2021-10-05 DIAGNOSIS — I5031 Acute diastolic (congestive) heart failure: Secondary | ICD-10-CM | POA: Diagnosis not present

## 2021-10-05 DIAGNOSIS — W1830XD Fall on same level, unspecified, subsequent encounter: Secondary | ICD-10-CM | POA: Diagnosis not present

## 2021-10-05 DIAGNOSIS — E46 Unspecified protein-calorie malnutrition: Secondary | ICD-10-CM | POA: Diagnosis not present

## 2021-10-05 DIAGNOSIS — R2681 Unsteadiness on feet: Secondary | ICD-10-CM | POA: Diagnosis not present

## 2021-10-06 ENCOUNTER — Other Ambulatory Visit (HOSPITAL_COMMUNITY): Payer: Self-pay

## 2021-10-10 DIAGNOSIS — I951 Orthostatic hypotension: Secondary | ICD-10-CM | POA: Diagnosis not present

## 2021-10-10 DIAGNOSIS — I5031 Acute diastolic (congestive) heart failure: Secondary | ICD-10-CM | POA: Diagnosis not present

## 2021-10-10 DIAGNOSIS — E46 Unspecified protein-calorie malnutrition: Secondary | ICD-10-CM | POA: Diagnosis not present

## 2021-10-10 DIAGNOSIS — R2681 Unsteadiness on feet: Secondary | ICD-10-CM | POA: Diagnosis not present

## 2021-10-10 DIAGNOSIS — W1830XD Fall on same level, unspecified, subsequent encounter: Secondary | ICD-10-CM | POA: Diagnosis not present

## 2021-10-10 DIAGNOSIS — Z7409 Other reduced mobility: Secondary | ICD-10-CM | POA: Diagnosis not present

## 2021-10-12 DIAGNOSIS — I951 Orthostatic hypotension: Secondary | ICD-10-CM | POA: Diagnosis not present

## 2021-10-12 DIAGNOSIS — E46 Unspecified protein-calorie malnutrition: Secondary | ICD-10-CM | POA: Diagnosis not present

## 2021-10-12 DIAGNOSIS — R2681 Unsteadiness on feet: Secondary | ICD-10-CM | POA: Diagnosis not present

## 2021-10-12 DIAGNOSIS — I5031 Acute diastolic (congestive) heart failure: Secondary | ICD-10-CM | POA: Diagnosis not present

## 2021-10-12 DIAGNOSIS — W1830XD Fall on same level, unspecified, subsequent encounter: Secondary | ICD-10-CM | POA: Diagnosis not present

## 2021-10-12 DIAGNOSIS — Z7409 Other reduced mobility: Secondary | ICD-10-CM | POA: Diagnosis not present

## 2021-10-13 DIAGNOSIS — E46 Unspecified protein-calorie malnutrition: Secondary | ICD-10-CM | POA: Diagnosis not present

## 2021-10-13 DIAGNOSIS — W1830XD Fall on same level, unspecified, subsequent encounter: Secondary | ICD-10-CM | POA: Diagnosis not present

## 2021-10-13 DIAGNOSIS — R2681 Unsteadiness on feet: Secondary | ICD-10-CM | POA: Diagnosis not present

## 2021-10-13 DIAGNOSIS — I5031 Acute diastolic (congestive) heart failure: Secondary | ICD-10-CM | POA: Diagnosis not present

## 2021-10-13 DIAGNOSIS — I951 Orthostatic hypotension: Secondary | ICD-10-CM | POA: Diagnosis not present

## 2021-10-13 DIAGNOSIS — Z7409 Other reduced mobility: Secondary | ICD-10-CM | POA: Diagnosis not present

## 2021-10-14 ENCOUNTER — Other Ambulatory Visit (HOSPITAL_COMMUNITY): Payer: Self-pay

## 2021-10-17 ENCOUNTER — Other Ambulatory Visit (HOSPITAL_COMMUNITY): Payer: Self-pay

## 2021-10-18 ENCOUNTER — Encounter: Payer: Self-pay | Admitting: Nurse Practitioner

## 2021-10-18 ENCOUNTER — Inpatient Hospital Stay: Payer: Medicare Other | Attending: Oncology | Admitting: Nurse Practitioner

## 2021-10-18 VITALS — BP 118/59 | HR 62 | Temp 98.1°F | Resp 18 | Ht 62.0 in | Wt 129.8 lb

## 2021-10-18 DIAGNOSIS — Z6822 Body mass index (BMI) 22.0-22.9, adult: Secondary | ICD-10-CM | POA: Diagnosis not present

## 2021-10-18 DIAGNOSIS — R768 Other specified abnormal immunological findings in serum: Secondary | ICD-10-CM | POA: Diagnosis not present

## 2021-10-18 DIAGNOSIS — M8000XD Age-related osteoporosis with current pathological fracture, unspecified site, subsequent encounter for fracture with routine healing: Secondary | ICD-10-CM | POA: Diagnosis not present

## 2021-10-18 DIAGNOSIS — C49A5 Gastrointestinal stromal tumor of rectum: Secondary | ICD-10-CM | POA: Insufficient documentation

## 2021-10-18 DIAGNOSIS — Z7901 Long term (current) use of anticoagulants: Secondary | ICD-10-CM | POA: Diagnosis not present

## 2021-10-18 DIAGNOSIS — M1991 Primary osteoarthritis, unspecified site: Secondary | ICD-10-CM | POA: Diagnosis not present

## 2021-10-18 DIAGNOSIS — M0600 Rheumatoid arthritis without rheumatoid factor, unspecified site: Secondary | ICD-10-CM | POA: Diagnosis not present

## 2021-10-18 DIAGNOSIS — M25562 Pain in left knee: Secondary | ICD-10-CM | POA: Diagnosis not present

## 2021-10-18 NOTE — Progress Notes (Signed)
  [name] OFFICE PROGRESS NOTE   Diagnosis: Rectal GIST  INTERVAL HISTORY:   Alexandria Weaver returns for follow-up.  She was hospitalized 09/08/2021 after a fall.  She was found to have a subdural hematoma.  Repeat images were obtained, SDH remained stable.  She was discharged 09/13/2021.  She has not started Gleevec.  She spent some time in rehab following the recent hospitalization.  She feels well at present.  Bowels moving regularly.  No pain or bleeding with bowel movements.  No more falls.  Objective:  Vital signs in last 24 hours:  Blood pressure (!) 118/59, pulse 62, temperature 98.1 F (36.7 C), resp. rate 18, height '5\' 2"'$  (1.575 m), weight 129 lb 12.8 oz (58.9 kg), SpO2 99 %.    HEENT: No thrush or ulcers. Resp: Lungs clear bilaterally. Cardio: Regular rate and rhythm. GI: No hepatosplenomegaly. Vascular: Trace bilateral ankle edema.   Lab Results:  Lab Results  Component Value Date   WBC 8.3 09/04/2019   HGB 13.2 09/04/2019   HCT 41.6 09/04/2019   MCV 92.2 09/04/2019   PLT 317 09/04/2019   NEUTROABS 7.0 08/20/2017    Imaging:  No results found.  Medications: I have reviewed the patient's current medications.  Assessment/Plan: Gastrointestinal stromal tumor of the rectum CT abdomen/pelvis 02/14/2019 22-4.7 cm low-attenuation lesion in the left lateral wall of the inferior rectum and anal canal MRI pelvis 06/15/2018 23-6 x 5.7 x 5.3 cm anorectal mass with involvement of the left-sided sphincter, no evidence of nodal metastases EUS 08/12/2018 23-6.5 cm heterogenous subepithelial mass communicating with the muscularis propria layer of the mid rectal wall, biopsy-gastrointestinal stromal tumor, high-grade Gleevec 200 mg daily beginning 10/19/2021 History of recurrent deep vein thrombosis-maintained on chronic anticoagulation Rheumatoid arthritis-maintained on weekly methotrexate Glaucoma Macular degeneration  Disposition: Alexandria Weaver appears stable.   She has not started Gleevec due to the fall, subdural hematoma, rehab stay in January.  She has returned home and feels ready to begin [place].  We again reviewed potential side effects.  She agrees to proceed.  She will return for lab and follow-up in 3 to 4 weeks.  She will contact the office in the interim with any problems.  Plan reviewed with Dr. Benay Spice.    Ned Card ANP/GNP-BC   10/18/2021  1:25 PM

## 2021-10-20 ENCOUNTER — Other Ambulatory Visit (HOSPITAL_COMMUNITY): Payer: Self-pay

## 2021-10-20 DIAGNOSIS — I5031 Acute diastolic (congestive) heart failure: Secondary | ICD-10-CM | POA: Diagnosis not present

## 2021-10-20 DIAGNOSIS — E46 Unspecified protein-calorie malnutrition: Secondary | ICD-10-CM | POA: Diagnosis not present

## 2021-10-20 DIAGNOSIS — I951 Orthostatic hypotension: Secondary | ICD-10-CM | POA: Diagnosis not present

## 2021-10-20 DIAGNOSIS — Z7409 Other reduced mobility: Secondary | ICD-10-CM | POA: Diagnosis not present

## 2021-10-20 DIAGNOSIS — R2681 Unsteadiness on feet: Secondary | ICD-10-CM | POA: Diagnosis not present

## 2021-10-20 DIAGNOSIS — W1830XD Fall on same level, unspecified, subsequent encounter: Secondary | ICD-10-CM | POA: Diagnosis not present

## 2021-10-21 DIAGNOSIS — W1830XD Fall on same level, unspecified, subsequent encounter: Secondary | ICD-10-CM | POA: Diagnosis not present

## 2021-10-21 DIAGNOSIS — R2681 Unsteadiness on feet: Secondary | ICD-10-CM | POA: Diagnosis not present

## 2021-10-21 DIAGNOSIS — E46 Unspecified protein-calorie malnutrition: Secondary | ICD-10-CM | POA: Diagnosis not present

## 2021-10-21 DIAGNOSIS — I951 Orthostatic hypotension: Secondary | ICD-10-CM | POA: Diagnosis not present

## 2021-10-21 DIAGNOSIS — I5031 Acute diastolic (congestive) heart failure: Secondary | ICD-10-CM | POA: Diagnosis not present

## 2021-10-21 DIAGNOSIS — Z7409 Other reduced mobility: Secondary | ICD-10-CM | POA: Diagnosis not present

## 2021-10-24 DIAGNOSIS — I951 Orthostatic hypotension: Secondary | ICD-10-CM | POA: Diagnosis not present

## 2021-10-24 DIAGNOSIS — W1830XD Fall on same level, unspecified, subsequent encounter: Secondary | ICD-10-CM | POA: Diagnosis not present

## 2021-10-24 DIAGNOSIS — E46 Unspecified protein-calorie malnutrition: Secondary | ICD-10-CM | POA: Diagnosis not present

## 2021-10-24 DIAGNOSIS — Z7409 Other reduced mobility: Secondary | ICD-10-CM | POA: Diagnosis not present

## 2021-10-24 DIAGNOSIS — R2681 Unsteadiness on feet: Secondary | ICD-10-CM | POA: Diagnosis not present

## 2021-10-24 DIAGNOSIS — I5031 Acute diastolic (congestive) heart failure: Secondary | ICD-10-CM | POA: Diagnosis not present

## 2021-10-25 ENCOUNTER — Encounter: Payer: Self-pay | Admitting: *Deleted

## 2021-10-25 NOTE — Progress Notes (Signed)
PATIENT NAVIGATOR PROGRESS NOTE  Name: Alexandria Weaver Date: 10/25/2021 MRN: [id number]  DOB: 03-30-1925   Reason for visit:  F/U call after start of [place]  Comments:  Spoke with [name], daughter of pt and she stated that her mother is tolerating well,no issues. Taking daily with food Encouraged to call with any issues or questions    Time spent counseling/coordinating care: 30-45 minutes

## 2021-10-27 DIAGNOSIS — R55 Syncope and collapse: Secondary | ICD-10-CM | POA: Diagnosis not present

## 2021-10-27 DIAGNOSIS — I951 Orthostatic hypotension: Secondary | ICD-10-CM | POA: Diagnosis not present

## 2021-10-27 DIAGNOSIS — D519 Vitamin B12 deficiency anemia, unspecified: Secondary | ICD-10-CM | POA: Diagnosis not present

## 2021-10-27 DIAGNOSIS — Z7409 Other reduced mobility: Secondary | ICD-10-CM | POA: Diagnosis not present

## 2021-10-27 DIAGNOSIS — M0600 Rheumatoid arthritis without rheumatoid factor, unspecified site: Secondary | ICD-10-CM | POA: Diagnosis not present

## 2021-10-27 DIAGNOSIS — E46 Unspecified protein-calorie malnutrition: Secondary | ICD-10-CM | POA: Diagnosis not present

## 2021-10-27 DIAGNOSIS — D6869 Other thrombophilia: Secondary | ICD-10-CM | POA: Diagnosis not present

## 2021-10-27 DIAGNOSIS — C49A0 Gastrointestinal stromal tumor, unspecified site: Secondary | ICD-10-CM | POA: Diagnosis not present

## 2021-10-27 DIAGNOSIS — E871 Hypo-osmolality and hyponatremia: Secondary | ICD-10-CM | POA: Diagnosis not present

## 2021-10-27 DIAGNOSIS — Z86718 Personal history of other venous thrombosis and embolism: Secondary | ICD-10-CM | POA: Diagnosis not present

## 2021-10-27 DIAGNOSIS — M810 Age-related osteoporosis without current pathological fracture: Secondary | ICD-10-CM | POA: Diagnosis not present

## 2021-10-27 DIAGNOSIS — F419 Anxiety disorder, unspecified: Secondary | ICD-10-CM | POA: Diagnosis not present

## 2021-10-28 DIAGNOSIS — E46 Unspecified protein-calorie malnutrition: Secondary | ICD-10-CM | POA: Diagnosis not present

## 2021-10-28 DIAGNOSIS — Z7409 Other reduced mobility: Secondary | ICD-10-CM | POA: Diagnosis not present

## 2021-10-28 DIAGNOSIS — I951 Orthostatic hypotension: Secondary | ICD-10-CM | POA: Diagnosis not present

## 2021-10-28 DIAGNOSIS — R2681 Unsteadiness on feet: Secondary | ICD-10-CM | POA: Diagnosis not present

## 2021-10-28 DIAGNOSIS — I5031 Acute diastolic (congestive) heart failure: Secondary | ICD-10-CM | POA: Diagnosis not present

## 2021-10-28 DIAGNOSIS — W1830XD Fall on same level, unspecified, subsequent encounter: Secondary | ICD-10-CM | POA: Diagnosis not present

## 2021-10-31 DIAGNOSIS — I5031 Acute diastolic (congestive) heart failure: Secondary | ICD-10-CM | POA: Diagnosis not present

## 2021-10-31 DIAGNOSIS — Z7409 Other reduced mobility: Secondary | ICD-10-CM | POA: Diagnosis not present

## 2021-10-31 DIAGNOSIS — R2681 Unsteadiness on feet: Secondary | ICD-10-CM | POA: Diagnosis not present

## 2021-10-31 DIAGNOSIS — E46 Unspecified protein-calorie malnutrition: Secondary | ICD-10-CM | POA: Diagnosis not present

## 2021-10-31 DIAGNOSIS — W1830XD Fall on same level, unspecified, subsequent encounter: Secondary | ICD-10-CM | POA: Diagnosis not present

## 2021-10-31 DIAGNOSIS — I951 Orthostatic hypotension: Secondary | ICD-10-CM | POA: Diagnosis not present

## 2021-11-01 ENCOUNTER — Other Ambulatory Visit (HOSPITAL_COMMUNITY): Payer: Self-pay

## 2021-11-02 DIAGNOSIS — Z7409 Other reduced mobility: Secondary | ICD-10-CM | POA: Diagnosis not present

## 2021-11-02 DIAGNOSIS — Z86718 Personal history of other venous thrombosis and embolism: Secondary | ICD-10-CM | POA: Diagnosis not present

## 2021-11-02 DIAGNOSIS — K629 Disease of anus and rectum, unspecified: Secondary | ICD-10-CM | POA: Diagnosis not present

## 2021-11-02 DIAGNOSIS — D519 Vitamin B12 deficiency anemia, unspecified: Secondary | ICD-10-CM | POA: Diagnosis not present

## 2021-11-02 DIAGNOSIS — R55 Syncope and collapse: Secondary | ICD-10-CM | POA: Diagnosis not present

## 2021-11-02 DIAGNOSIS — D6869 Other thrombophilia: Secondary | ICD-10-CM | POA: Diagnosis not present

## 2021-11-02 DIAGNOSIS — R0989 Other specified symptoms and signs involving the circulatory and respiratory systems: Secondary | ICD-10-CM | POA: Diagnosis not present

## 2021-11-02 DIAGNOSIS — R7303 Prediabetes: Secondary | ICD-10-CM | POA: Diagnosis not present

## 2021-11-02 DIAGNOSIS — I700 Atherosclerosis of aorta: Secondary | ICD-10-CM | POA: Diagnosis not present

## 2021-11-02 DIAGNOSIS — E46 Unspecified protein-calorie malnutrition: Secondary | ICD-10-CM | POA: Diagnosis not present

## 2021-11-02 DIAGNOSIS — M0600 Rheumatoid arthritis without rheumatoid factor, unspecified site: Secondary | ICD-10-CM | POA: Diagnosis not present

## 2021-11-02 DIAGNOSIS — R2681 Unsteadiness on feet: Secondary | ICD-10-CM | POA: Diagnosis not present

## 2021-11-02 DIAGNOSIS — E782 Mixed hyperlipidemia: Secondary | ICD-10-CM | POA: Diagnosis not present

## 2021-11-02 DIAGNOSIS — I951 Orthostatic hypotension: Secondary | ICD-10-CM | POA: Diagnosis not present

## 2021-11-02 DIAGNOSIS — F419 Anxiety disorder, unspecified: Secondary | ICD-10-CM | POA: Diagnosis not present

## 2021-11-02 DIAGNOSIS — M810 Age-related osteoporosis without current pathological fracture: Secondary | ICD-10-CM | POA: Diagnosis not present

## 2021-11-02 DIAGNOSIS — I5031 Acute diastolic (congestive) heart failure: Secondary | ICD-10-CM | POA: Diagnosis not present

## 2021-11-02 DIAGNOSIS — W1830XD Fall on same level, unspecified, subsequent encounter: Secondary | ICD-10-CM | POA: Diagnosis not present

## 2021-11-04 ENCOUNTER — Other Ambulatory Visit (HOSPITAL_COMMUNITY): Payer: Self-pay

## 2021-11-08 DIAGNOSIS — I739 Peripheral vascular disease, unspecified: Secondary | ICD-10-CM | POA: Diagnosis not present

## 2021-11-09 ENCOUNTER — Other Ambulatory Visit (HOSPITAL_COMMUNITY): Payer: Self-pay

## 2021-11-09 ENCOUNTER — Other Ambulatory Visit: Payer: Self-pay | Admitting: Oncology

## 2021-11-09 DIAGNOSIS — Z7409 Other reduced mobility: Secondary | ICD-10-CM | POA: Diagnosis not present

## 2021-11-09 DIAGNOSIS — E46 Unspecified protein-calorie malnutrition: Secondary | ICD-10-CM | POA: Diagnosis not present

## 2021-11-09 DIAGNOSIS — I5031 Acute diastolic (congestive) heart failure: Secondary | ICD-10-CM | POA: Diagnosis not present

## 2021-11-09 DIAGNOSIS — W1830XD Fall on same level, unspecified, subsequent encounter: Secondary | ICD-10-CM | POA: Diagnosis not present

## 2021-11-09 DIAGNOSIS — R2681 Unsteadiness on feet: Secondary | ICD-10-CM | POA: Diagnosis not present

## 2021-11-09 DIAGNOSIS — I951 Orthostatic hypotension: Secondary | ICD-10-CM | POA: Diagnosis not present

## 2021-11-09 MED ORDER — IMATINIB MESYLATE 100 MG PO TABS
200.0000 mg | ORAL_TABLET | Freq: Every day | ORAL | 0 refills | Status: DC
  Filled 2021-11-09: qty 60, 30d supply, fill #0

## 2021-11-10 DIAGNOSIS — M79671 Pain in right foot: Secondary | ICD-10-CM | POA: Diagnosis not present

## 2021-11-10 DIAGNOSIS — M25579 Pain in unspecified ankle and joints of unspecified foot: Secondary | ICD-10-CM | POA: Diagnosis not present

## 2021-11-15 ENCOUNTER — Inpatient Hospital Stay: Payer: Medicare Other | Attending: Oncology

## 2021-11-15 ENCOUNTER — Inpatient Hospital Stay (HOSPITAL_BASED_OUTPATIENT_CLINIC_OR_DEPARTMENT_OTHER): Payer: Medicare Other | Admitting: Oncology

## 2021-11-15 ENCOUNTER — Other Ambulatory Visit (HOSPITAL_COMMUNITY): Payer: Self-pay

## 2021-11-15 VITALS — BP 122/48 | HR 63 | Temp 98.2°F | Resp 18 | Ht 62.0 in | Wt 133.6 lb

## 2021-11-15 DIAGNOSIS — Z86718 Personal history of other venous thrombosis and embolism: Secondary | ICD-10-CM | POA: Insufficient documentation

## 2021-11-15 DIAGNOSIS — C49A5 Gastrointestinal stromal tumor of rectum: Secondary | ICD-10-CM | POA: Insufficient documentation

## 2021-11-15 DIAGNOSIS — Z7901 Long term (current) use of anticoagulants: Secondary | ICD-10-CM | POA: Insufficient documentation

## 2021-11-15 LAB — CMP (CANCER CENTER ONLY)
ALT: 8 U/L (ref 0–44)
AST: 18 U/L (ref 15–41)
Albumin: 3.5 g/dL (ref 3.5–5.0)
Alkaline Phosphatase: 35 U/L — ABNORMAL LOW (ref 38–126)
Anion gap: 5 (ref 5–15)
BUN: 15 mg/dL (ref 8–23)
CO2: 28 mmol/L (ref 22–32)
Calcium: 8.6 mg/dL — ABNORMAL LOW (ref 8.9–10.3)
Chloride: 105 mmol/L (ref 98–111)
Creatinine: 0.96 mg/dL (ref 0.44–1.00)
GFR, Estimated: 55 mL/min — ABNORMAL LOW (ref >60)
Glucose, Bld: 78 mg/dL (ref 70–99)
Potassium: 4.8 mmol/L (ref 3.5–5.1)
Sodium: 138 mmol/L (ref 135–145)
Total Bilirubin: 0.5 mg/dL (ref 0.3–1.2)
Total Protein: 6.2 g/dL — ABNORMAL LOW (ref 6.5–8.1)

## 2021-11-15 LAB — CBC WITH DIFFERENTIAL (CANCER CENTER ONLY)
Abs Immature Granulocytes: 0.01 10*3/uL (ref 0.00–0.07)
Basophils Absolute: 0 10*3/uL (ref 0.0–0.1)
Basophils Relative: 1 %
Eosinophils Absolute: 0.4 10*3/uL (ref 0.0–0.5)
Eosinophils Relative: 10 %
HCT: 32.3 % — ABNORMAL LOW (ref 36.0–46.0)
Hemoglobin: 10.1 g/dL — ABNORMAL LOW (ref 12.0–15.0)
Immature Granulocytes: 0 %
Lymphocytes Relative: 31 %
Lymphs Abs: 1.4 10*3/uL (ref 0.7–4.0)
MCH: 28.9 pg (ref 26.0–34.0)
MCHC: 31.3 g/dL (ref 30.0–36.0)
MCV: 92.3 fL (ref 80.0–100.0)
Monocytes Absolute: 0.6 10*3/uL (ref 0.1–1.0)
Monocytes Relative: 13 %
Neutro Abs: 1.9 10*3/uL (ref 1.7–7.7)
Neutrophils Relative %: 45 %
Platelet Count: 162 10*3/uL (ref 150–400)
RBC: 3.5 MIL/uL — ABNORMAL LOW (ref 3.87–5.11)
RDW: 16.7 % — ABNORMAL HIGH (ref 11.5–15.5)
WBC Count: 4.3 10*3/uL (ref 4.0–10.5)
nRBC: 0 % (ref 0.0–0.2)

## 2021-11-15 NOTE — Progress Notes (Signed)
  Alexandria Weaver OFFICE PROGRESS NOTE   Diagnosis: Gastrointestinal stromal tumor  INTERVAL HISTORY:   Alexandria Weaver returns as scheduled.  She is taking Gleevec.  No swelling, rash, or diarrhea.  No new complaint.  She takes weekly methotrexate for rheumatoid arthritis.  No bleeding.  Objective:  Vital signs in last 24 hours:  Blood pressure (!) 122/48, pulse 63, temperature 98.2 F (36.8 C), temperature source Oral, resp. rate 18, height '5\' 2"'$  (1.575 m), weight 133 lb 9.6 oz (60.6 kg), SpO2 98 %.    HEENT: No thrush or ulcers Resp: Lungs clear bilaterally Cardio: Regular rate and rhythm GI: No hepatosplenomegaly, nontender, no mass Vascular: No leg edema    Lab Results:  Lab Results  Component Value Date   WBC 4.3 11/15/2021   HGB 10.1 (L) 11/15/2021   HCT 32.3 (L) 11/15/2021   MCV 92.3 11/15/2021   PLT 162 11/15/2021   NEUTROABS 1.9 11/15/2021    CMP  Lab Results  Component Value Date   NA 137 09/04/2019   K 4.3 09/04/2019   CL 99 09/04/2019   CO2 29 09/04/2019   GLUCOSE 92 09/04/2019   BUN 19 09/04/2019   CREATININE 0.86 09/04/2019   CALCIUM 9.1 09/04/2019   PROT 7.2 08/20/2017   ALBUMIN 3.6 08/20/2017   AST 18 08/20/2017   ALT 11 (L) 08/20/2017   ALKPHOS 53 08/20/2017   BILITOT 0.9 08/20/2017   GFRNONAA 59 (L) 09/04/2019   GFRAA >60 09/04/2019     Medications: I have reviewed the patient's current medications.   Assessment/Plan:  Gastrointestinal stromal tumor of the rectum CT abdomen/pelvis 02/14/2019 22-4.7 cm low-attenuation lesion in the left lateral wall of the inferior rectum and anal canal MRI pelvis 06/15/2018 23-6 x 5.7 x 5.3 cm anorectal mass with involvement of the left-sided sphincter, no evidence of nodal metastases EUS 08/12/2018 23-6.5 cm heterogenous subepithelial mass communicating with the muscularis propria layer of the mid rectal wall, biopsy-gastrointestinal stromal tumor, high-grade Gleevec 200 mg daily beginning  10/19/2021 History of recurrent deep vein thrombosis-maintained on chronic anticoagulation Rheumatoid arthritis-maintained on weekly methotrexate Glaucoma Macular degeneration   Disposition: Alexandria Weaver is tolerating the [place] well.  She will continue Gleevec at the current dose.  The hemoglobin is lower today compared to 13.1 on 09/09/2021.  We will follow-up on the most recent CBC from Dr. Amil Weaver.  She will have a repeat CBC in 2 weeks.  She will return for an office and lab visit in 4 weeks.  Alexandria Coder, MD  11/15/2021  12:13 PM

## 2021-11-16 ENCOUNTER — Other Ambulatory Visit: Payer: Self-pay | Admitting: *Deleted

## 2021-11-16 DIAGNOSIS — C49A5 Gastrointestinal stromal tumor of rectum: Secondary | ICD-10-CM

## 2021-11-17 DIAGNOSIS — H33321 Round hole, right eye: Secondary | ICD-10-CM | POA: Diagnosis not present

## 2021-11-17 DIAGNOSIS — H35423 Microcystoid degeneration of retina, bilateral: Secondary | ICD-10-CM | POA: Diagnosis not present

## 2021-11-17 DIAGNOSIS — H35033 Hypertensive retinopathy, bilateral: Secondary | ICD-10-CM | POA: Diagnosis not present

## 2021-11-17 DIAGNOSIS — H353231 Exudative age-related macular degeneration, bilateral, with active choroidal neovascularization: Secondary | ICD-10-CM | POA: Diagnosis not present

## 2021-11-18 ENCOUNTER — Other Ambulatory Visit (HOSPITAL_COMMUNITY): Payer: Self-pay

## 2021-11-21 DIAGNOSIS — E46 Unspecified protein-calorie malnutrition: Secondary | ICD-10-CM | POA: Diagnosis not present

## 2021-11-21 DIAGNOSIS — Z7409 Other reduced mobility: Secondary | ICD-10-CM | POA: Diagnosis not present

## 2021-11-21 DIAGNOSIS — W1830XD Fall on same level, unspecified, subsequent encounter: Secondary | ICD-10-CM | POA: Diagnosis not present

## 2021-11-21 DIAGNOSIS — I5031 Acute diastolic (congestive) heart failure: Secondary | ICD-10-CM | POA: Diagnosis not present

## 2021-11-21 DIAGNOSIS — R2681 Unsteadiness on feet: Secondary | ICD-10-CM | POA: Diagnosis not present

## 2021-11-21 DIAGNOSIS — I951 Orthostatic hypotension: Secondary | ICD-10-CM | POA: Diagnosis not present

## 2021-11-29 DIAGNOSIS — I10 Essential (primary) hypertension: Secondary | ICD-10-CM | POA: Diagnosis not present

## 2021-12-07 ENCOUNTER — Other Ambulatory Visit: Payer: Self-pay | Admitting: Neurology

## 2021-12-08 ENCOUNTER — Other Ambulatory Visit (HOSPITAL_COMMUNITY): Payer: Self-pay

## 2021-12-13 ENCOUNTER — Other Ambulatory Visit (HOSPITAL_COMMUNITY): Payer: Self-pay

## 2021-12-13 ENCOUNTER — Other Ambulatory Visit: Payer: Self-pay | Admitting: Oncology

## 2021-12-13 MED ORDER — IMATINIB MESYLATE 100 MG PO TABS
200.0000 mg | ORAL_TABLET | Freq: Every day | ORAL | 0 refills | Status: DC
Start: 2021-12-13 — End: 2022-01-12
  Filled 2021-12-13: qty 60, 30d supply, fill #0

## 2021-12-15 ENCOUNTER — Other Ambulatory Visit (HOSPITAL_COMMUNITY): Payer: Self-pay

## 2021-12-15 ENCOUNTER — Encounter: Payer: Self-pay | Admitting: Nurse Practitioner

## 2021-12-15 ENCOUNTER — Inpatient Hospital Stay (HOSPITAL_BASED_OUTPATIENT_CLINIC_OR_DEPARTMENT_OTHER): Payer: Medicare Other | Admitting: Nurse Practitioner

## 2021-12-15 ENCOUNTER — Inpatient Hospital Stay: Payer: Medicare Other | Attending: Oncology

## 2021-12-15 VITALS — BP 159/52 | HR 55 | Temp 97.8°F | Resp 18 | Ht 62.0 in | Wt 140.8 lb

## 2021-12-15 DIAGNOSIS — C49A5 Gastrointestinal stromal tumor of rectum: Secondary | ICD-10-CM | POA: Insufficient documentation

## 2021-12-15 DIAGNOSIS — R110 Nausea: Secondary | ICD-10-CM | POA: Diagnosis not present

## 2021-12-15 DIAGNOSIS — D649 Anemia, unspecified: Secondary | ICD-10-CM | POA: Insufficient documentation

## 2021-12-15 DIAGNOSIS — R197 Diarrhea, unspecified: Secondary | ICD-10-CM | POA: Insufficient documentation

## 2021-12-15 LAB — CMP (CANCER CENTER ONLY)
ALT: 71 U/L — ABNORMAL HIGH (ref 0–44)
AST: 148 U/L — ABNORMAL HIGH (ref 15–41)
Albumin: 3.5 g/dL (ref 3.5–5.0)
Alkaline Phosphatase: 73 U/L (ref 38–126)
Anion gap: 5 (ref 5–15)
BUN: 16 mg/dL (ref 8–23)
CO2: 27 mmol/L (ref 22–32)
Calcium: 8.4 mg/dL — ABNORMAL LOW (ref 8.9–10.3)
Chloride: 106 mmol/L (ref 98–111)
Creatinine: 0.89 mg/dL (ref 0.44–1.00)
GFR, Estimated: >60 mL/min (ref >60)
Glucose, Bld: 84 mg/dL (ref 70–99)
Potassium: 4.8 mmol/L (ref 3.5–5.1)
Sodium: 138 mmol/L (ref 135–145)
Total Bilirubin: 0.6 mg/dL (ref 0.3–1.2)
Total Protein: 6.1 g/dL — ABNORMAL LOW (ref 6.5–8.1)

## 2021-12-15 LAB — CBC WITH DIFFERENTIAL (CANCER CENTER ONLY)
Abs Immature Granulocytes: 0.03 10*3/uL (ref 0.00–0.07)
Basophils Absolute: 0 10*3/uL (ref 0.0–0.1)
Basophils Relative: 1 %
Eosinophils Absolute: 0.2 10*3/uL (ref 0.0–0.5)
Eosinophils Relative: 4 %
HCT: 32.1 % — ABNORMAL LOW (ref 36.0–46.0)
Hemoglobin: 10.1 g/dL — ABNORMAL LOW (ref 12.0–15.0)
Immature Granulocytes: 1 %
Lymphocytes Relative: 24 %
Lymphs Abs: 1 10*3/uL (ref 0.7–4.0)
MCH: 29.5 pg (ref 26.0–34.0)
MCHC: 31.5 g/dL (ref 30.0–36.0)
MCV: 93.9 fL (ref 80.0–100.0)
Monocytes Absolute: 0.3 10*3/uL (ref 0.1–1.0)
Monocytes Relative: 7 %
Neutro Abs: 2.6 10*3/uL (ref 1.7–7.7)
Neutrophils Relative %: 63 %
Platelet Count: 170 10*3/uL (ref 150–400)
RBC: 3.42 MIL/uL — ABNORMAL LOW (ref 3.87–5.11)
RDW: 17.3 % — ABNORMAL HIGH (ref 11.5–15.5)
WBC Count: 4.1 10*3/uL (ref 4.0–10.5)
nRBC: 0 % (ref 0.0–0.2)

## 2021-12-15 NOTE — Progress Notes (Signed)
  [place] OFFICE PROGRESS NOTE   Diagnosis: Gastrointestinal stromal tumor  INTERVAL HISTORY:   Alexandria Weaver returns as scheduled.  She continues Gleevec.  She is occasional mild nausea.  Occasional diarrhea.  No rash.  She has a good appetite.  Energy level is unchanged, she feels "tired".  She notes mild swelling around the eyes and at the lower legs.  She denies bleeding.  Objective:  Vital signs in last 24 hours:  Blood pressure (!) 159/52, pulse (!) 55, temperature 97.8 F (36.6 C), temperature source Oral, resp. rate 18, height '5\' 2"'$  (1.575 m), weight 140 lb 12.8 oz (63.9 kg), SpO2 99 %.    HEENT: Mild periorbital edema.  No thrush or ulcers. Resp: Lungs clear bilaterally. Cardio: Regular rate and rhythm. GI: No hepatosplenomegaly.  No mass. Vascular: Trace edema lower leg bilaterally. Skin: No rash.   Lab Results:  Lab Results  Component Value Date   WBC 4.1 12/15/2021   HGB 10.1 (L) 12/15/2021   HCT 32.1 (L) 12/15/2021   MCV 93.9 12/15/2021   PLT 170 12/15/2021   NEUTROABS 2.6 12/15/2021    Imaging:  No results found.  Medications: I have reviewed the patient's current medications.  Assessment/Plan: Gastrointestinal stromal tumor of the rectum CT abdomen/pelvis 02/14/2019 22-4.7 cm low-attenuation lesion in the left lateral wall of the inferior rectum and anal canal MRI pelvis 06/15/2018 23-6 x 5.7 x 5.3 cm anorectal mass with involvement of the left-sided sphincter, no evidence of nodal metastases EUS 08/12/2018 23-6.5 cm heterogenous subepithelial mass communicating with the muscularis propria layer of the mid rectal wall, biopsy-gastrointestinal stromal tumor, high-grade Gleevec 200 mg daily beginning 10/19/2021 History of recurrent deep vein thrombosis-maintained on chronic anticoagulation Rheumatoid arthritis-maintained on weekly methotrexate Glaucoma Macular degeneration  Disposition: Alexandria Weaver appears stable.  She seems to be  tolerating Gleevec well.  Plan to continue the same.  Restaging CT when she has been on Gleevec about 4 months.  CBC and chemistry panel reviewed.  She has stable anemia.  This may be related to [place].  We will check a ferritin level next office visit.  Liver enzymes are elevated.  This may be related to [place] as well.  We will continue to monitor.  Signs/symptoms suggestive of progressive anemia reviewed with Alexandria Weaver and her daughter at today's visit.  She will return for lab and follow-up in approximately 1 month.  We are available to see her sooner if needed.    Ned Card ANP/GNP-BC   12/15/2021  11:19 AM

## 2021-12-29 DIAGNOSIS — H35423 Microcystoid degeneration of retina, bilateral: Secondary | ICD-10-CM | POA: Diagnosis not present

## 2021-12-29 DIAGNOSIS — H353231 Exudative age-related macular degeneration, bilateral, with active choroidal neovascularization: Secondary | ICD-10-CM | POA: Diagnosis not present

## 2021-12-29 DIAGNOSIS — H33321 Round hole, right eye: Secondary | ICD-10-CM | POA: Diagnosis not present

## 2021-12-29 DIAGNOSIS — H35033 Hypertensive retinopathy, bilateral: Secondary | ICD-10-CM | POA: Diagnosis not present

## 2022-01-02 DIAGNOSIS — Z23 Encounter for immunization: Secondary | ICD-10-CM | POA: Diagnosis not present

## 2022-01-06 ENCOUNTER — Other Ambulatory Visit (HOSPITAL_COMMUNITY): Payer: Self-pay

## 2022-01-09 ENCOUNTER — Other Ambulatory Visit (HOSPITAL_COMMUNITY): Payer: Self-pay

## 2022-01-12 ENCOUNTER — Other Ambulatory Visit: Payer: Self-pay | Admitting: Oncology

## 2022-01-12 ENCOUNTER — Encounter: Payer: Self-pay | Admitting: Nurse Practitioner

## 2022-01-12 ENCOUNTER — Inpatient Hospital Stay: Payer: Medicare Other | Attending: Oncology

## 2022-01-12 ENCOUNTER — Inpatient Hospital Stay (HOSPITAL_BASED_OUTPATIENT_CLINIC_OR_DEPARTMENT_OTHER): Payer: Medicare Other | Admitting: Nurse Practitioner

## 2022-01-12 VITALS — BP 110/50 | HR 58 | Temp 98.1°F | Resp 18 | Ht 62.0 in | Wt 140.1 lb

## 2022-01-12 DIAGNOSIS — C49A5 Gastrointestinal stromal tumor of rectum: Secondary | ICD-10-CM | POA: Diagnosis not present

## 2022-01-12 DIAGNOSIS — Z86718 Personal history of other venous thrombosis and embolism: Secondary | ICD-10-CM | POA: Diagnosis not present

## 2022-01-12 LAB — CMP (CANCER CENTER ONLY)
ALT: 8 U/L (ref 0–44)
AST: 23 U/L (ref 15–41)
Albumin: 3.4 g/dL — ABNORMAL LOW (ref 3.5–5.0)
Alkaline Phosphatase: 44 U/L (ref 38–126)
Anion gap: 9 (ref 5–15)
BUN: 13 mg/dL (ref 8–23)
CO2: 25 mmol/L (ref 22–32)
Calcium: 8.5 mg/dL — ABNORMAL LOW (ref 8.9–10.3)
Chloride: 104 mmol/L (ref 98–111)
Creatinine: 0.98 mg/dL (ref 0.44–1.00)
GFR, Estimated: 53 mL/min — ABNORMAL LOW (ref >60)
Glucose, Bld: 83 mg/dL (ref 70–99)
Potassium: 5.1 mmol/L (ref 3.5–5.1)
Sodium: 138 mmol/L (ref 135–145)
Total Bilirubin: 0.7 mg/dL (ref 0.3–1.2)
Total Protein: 6 g/dL — ABNORMAL LOW (ref 6.5–8.1)

## 2022-01-12 LAB — CBC WITH DIFFERENTIAL (CANCER CENTER ONLY)
Abs Immature Granulocytes: 0 10*3/uL (ref 0.00–0.07)
Basophils Absolute: 0 10*3/uL (ref 0.0–0.1)
Basophils Relative: 1 %
Eosinophils Absolute: 0.2 10*3/uL (ref 0.0–0.5)
Eosinophils Relative: 6 %
HCT: 31.4 % — ABNORMAL LOW (ref 36.0–46.0)
Hemoglobin: 10.1 g/dL — ABNORMAL LOW (ref 12.0–15.0)
Immature Granulocytes: 0 %
Lymphocytes Relative: 27 %
Lymphs Abs: 1 10*3/uL (ref 0.7–4.0)
MCH: 30.9 pg (ref 26.0–34.0)
MCHC: 32.2 g/dL (ref 30.0–36.0)
MCV: 96 fL (ref 80.0–100.0)
Monocytes Absolute: 0.3 10*3/uL (ref 0.1–1.0)
Monocytes Relative: 8 %
Neutro Abs: 2.1 10*3/uL (ref 1.7–7.7)
Neutrophils Relative %: 58 %
Platelet Count: 128 10*3/uL — ABNORMAL LOW (ref 150–400)
RBC: 3.27 MIL/uL — ABNORMAL LOW (ref 3.87–5.11)
RDW: 16.9 % — ABNORMAL HIGH (ref 11.5–15.5)
WBC Count: 3.7 10*3/uL — ABNORMAL LOW (ref 4.0–10.5)
nRBC: 0 % (ref 0.0–0.2)

## 2022-01-12 LAB — FERRITIN: Ferritin: 50 ng/mL (ref 11–307)

## 2022-01-12 NOTE — Progress Notes (Signed)
  [place] OFFICE PROGRESS NOTE   Diagnosis: Gastrointestinal stromal tumor  INTERVAL HISTORY:   Alexandria Weaver returns as scheduled.  She continues Gleevec.  She denies nausea/vomiting.  No mouth sores.  Once a week on the day she takes methotrexate she subsequently develops loose stools for 2 to 3 hours.  She takes one half of an Imodium tablet with good results.  She does not have diarrhea for the remainder of the week.  No rash.  No significant edema.  She felt weak last week.  She is feeling better today.  Objective:  Vital signs in last 24 hours:  Blood pressure (!) 109/48, pulse (!) 58, temperature 98.1 F (36.7 C), resp. rate 18, height '5\' 2"'$  (1.575 m), weight 140 lb 1.6 oz (63.5 kg), SpO2 98 %.    HEENT: No thrush or ulcers. Resp: Lungs clear bilaterally. Cardio: Regular rate and rhythm. GI: Abdomen soft and nontender.  No hepatosplenomegaly.  No mass. Vascular: Very minimal lower leg edema bilaterally. Skin: No rash.   Lab Results:  Lab Results  Component Value Date   WBC 3.7 (L) 01/12/2022   HGB 10.1 (L) 01/12/2022   HCT 31.4 (L) 01/12/2022   MCV 96.0 01/12/2022   PLT 128 (L) 01/12/2022   NEUTROABS 2.1 01/12/2022    Imaging:  No results found.  Medications: I have reviewed the patient's current medications.  Assessment/Plan: Gastrointestinal stromal tumor of the rectum CT abdomen/pelvis 02/14/2019 22-4.7 cm low-attenuation lesion in the left lateral wall of the inferior rectum and anal canal MRI pelvis 06/15/2018 23-6 x 5.7 x 5.3 cm anorectal mass with involvement of the left-sided sphincter, no evidence of nodal metastases EUS 08/12/2018 23-6.5 cm heterogenous subepithelial mass communicating with the muscularis propria layer of the mid rectal wall, biopsy-gastrointestinal stromal tumor, high-grade Gleevec 200 mg daily beginning 10/19/2021 History of recurrent deep vein thrombosis-maintained on chronic anticoagulation Rheumatoid  arthritis-maintained on weekly methotrexate Glaucoma Macular degeneration  Disposition: Alexandria Weaver appears stable.  She continues Gleevec.  Overall tolerating well.  Plan for restaging CTs prior to next office visit.  CBC and chemistry panel reviewed.  Labs adequate to continue [place].  She has mild leukopenia and thrombocytopenia likely related to [place].  Monitor for now.  Blood pressure today is lower than her typical baseline.  She takes Toprol XL for a tremor and has noted significant improvement.  She will adjust the dose from 25 mg to 12.5 mg.  Her daughter will check her blood pressure at home.  She will return for lab and follow-up in 1 month, restaging CTs a few days prior.  She will contact the office in the interim with any problems.    Ned Card ANP/GNP-BC   01/12/2022  10:16 AM

## 2022-01-13 ENCOUNTER — Other Ambulatory Visit (HOSPITAL_COMMUNITY): Payer: Self-pay

## 2022-01-13 MED ORDER — IMATINIB MESYLATE 100 MG PO TABS
200.0000 mg | ORAL_TABLET | Freq: Every day | ORAL | 0 refills | Status: DC
  Filled 2022-01-13 (×2): qty 60, 30d supply, fill #0

## 2022-01-23 ENCOUNTER — Encounter (HOSPITAL_COMMUNITY): Payer: Self-pay

## 2022-01-23 DIAGNOSIS — K810 Acute cholecystitis: Secondary | ICD-10-CM | POA: Diagnosis not present

## 2022-01-23 DIAGNOSIS — K449 Diaphragmatic hernia without obstruction or gangrene: Secondary | ICD-10-CM | POA: Diagnosis not present

## 2022-01-23 DIAGNOSIS — R188 Other ascites: Secondary | ICD-10-CM | POA: Diagnosis not present

## 2022-01-23 DIAGNOSIS — R079 Chest pain, unspecified: Secondary | ICD-10-CM | POA: Diagnosis not present

## 2022-01-23 DIAGNOSIS — Z20822 Contact with and (suspected) exposure to covid-19: Secondary | ICD-10-CM | POA: Diagnosis not present

## 2022-01-23 DIAGNOSIS — J90 Pleural effusion, not elsewhere classified: Secondary | ICD-10-CM | POA: Diagnosis not present

## 2022-01-23 DIAGNOSIS — R000 Tachycardia, unspecified: Secondary | ICD-10-CM | POA: Diagnosis not present

## 2022-01-23 DIAGNOSIS — Z880 Allergy status to penicillin: Secondary | ICD-10-CM | POA: Diagnosis not present

## 2022-01-23 DIAGNOSIS — I700 Atherosclerosis of aorta: Secondary | ICD-10-CM | POA: Diagnosis not present

## 2022-01-23 DIAGNOSIS — R109 Unspecified abdominal pain: Secondary | ICD-10-CM | POA: Diagnosis not present

## 2022-01-23 DIAGNOSIS — K831 Obstruction of bile duct: Secondary | ICD-10-CM | POA: Diagnosis not present

## 2022-01-23 DIAGNOSIS — A419 Sepsis, unspecified organism: Secondary | ICD-10-CM | POA: Diagnosis not present

## 2022-01-24 ENCOUNTER — Inpatient Hospital Stay (HOSPITAL_COMMUNITY)
Admit: 2022-01-24 | Discharge: 2022-01-31 | DRG: 444 | Disposition: A | Discharge status: Skilled Nursing Facility | Payer: Medicare Other | Source: Other Acute Inpatient Hospital | Attending: Internal Medicine | Admitting: Internal Medicine

## 2022-01-24 ENCOUNTER — Inpatient Hospital Stay (HOSPITAL_COMMUNITY): Payer: Medicare Other

## 2022-01-24 ENCOUNTER — Ambulatory Visit (HOSPITAL_COMMUNITY): Payer: Medicare Other

## 2022-01-24 ENCOUNTER — Encounter (HOSPITAL_COMMUNITY): Payer: Self-pay | Admitting: Internal Medicine

## 2022-01-24 DIAGNOSIS — Z880 Allergy status to penicillin: Secondary | ICD-10-CM

## 2022-01-24 DIAGNOSIS — C49A5 Gastrointestinal stromal tumor of rectum: Secondary | ICD-10-CM | POA: Diagnosis present

## 2022-01-24 DIAGNOSIS — F419 Anxiety disorder, unspecified: Secondary | ICD-10-CM | POA: Diagnosis present

## 2022-01-24 DIAGNOSIS — N179 Acute kidney failure, unspecified: Secondary | ICD-10-CM | POA: Diagnosis not present

## 2022-01-24 DIAGNOSIS — C49A0 Gastrointestinal stromal tumor, unspecified site: Secondary | ICD-10-CM

## 2022-01-24 DIAGNOSIS — R109 Unspecified abdominal pain: Principal | ICD-10-CM

## 2022-01-24 DIAGNOSIS — N1831 Chronic kidney disease, stage 3a: Secondary | ICD-10-CM | POA: Diagnosis present

## 2022-01-24 DIAGNOSIS — Z86718 Personal history of other venous thrombosis and embolism: Secondary | ICD-10-CM | POA: Diagnosis not present

## 2022-01-24 DIAGNOSIS — J90 Pleural effusion, not elsewhere classified: Secondary | ICD-10-CM | POA: Diagnosis not present

## 2022-01-24 DIAGNOSIS — Z807 Family history of other malignant neoplasms of lymphoid, hematopoietic and related tissues: Secondary | ICD-10-CM

## 2022-01-24 DIAGNOSIS — I130 Hypertensive heart and chronic kidney disease with heart failure and stage 1 through stage 4 chronic kidney disease, or unspecified chronic kidney disease: Secondary | ICD-10-CM | POA: Diagnosis present

## 2022-01-24 DIAGNOSIS — D631 Anemia in chronic kidney disease: Secondary | ICD-10-CM | POA: Diagnosis present

## 2022-01-24 DIAGNOSIS — Z823 Family history of stroke: Secondary | ICD-10-CM

## 2022-01-24 DIAGNOSIS — I252 Old myocardial infarction: Secondary | ICD-10-CM | POA: Diagnosis not present

## 2022-01-24 DIAGNOSIS — Z79620 Long term (current) use of immunosuppressive biologic: Secondary | ICD-10-CM

## 2022-01-24 DIAGNOSIS — I5041 Acute combined systolic (congestive) and diastolic (congestive) heart failure: Secondary | ICD-10-CM | POA: Diagnosis present

## 2022-01-24 DIAGNOSIS — E8809 Other disorders of plasma-protein metabolism, not elsewhere classified: Secondary | ICD-10-CM | POA: Diagnosis present

## 2022-01-24 DIAGNOSIS — H3530 Unspecified macular degeneration: Secondary | ICD-10-CM

## 2022-01-24 DIAGNOSIS — I249 Acute ischemic heart disease, unspecified: Secondary | ICD-10-CM | POA: Diagnosis present

## 2022-01-24 DIAGNOSIS — K828 Other specified diseases of gallbladder: Principal | ICD-10-CM | POA: Diagnosis present

## 2022-01-24 DIAGNOSIS — M069 Rheumatoid arthritis, unspecified: Secondary | ICD-10-CM

## 2022-01-24 DIAGNOSIS — Z79899 Other long term (current) drug therapy: Secondary | ICD-10-CM

## 2022-01-24 DIAGNOSIS — K5790 Diverticulosis of intestine, part unspecified, without perforation or abscess without bleeding: Secondary | ICD-10-CM | POA: Diagnosis present

## 2022-01-24 DIAGNOSIS — R609 Edema, unspecified: Secondary | ICD-10-CM | POA: Diagnosis not present

## 2022-01-24 DIAGNOSIS — Z91048 Other nonmedicinal substance allergy status: Secondary | ICD-10-CM

## 2022-01-24 DIAGNOSIS — Z515 Encounter for palliative care: Secondary | ICD-10-CM | POA: Diagnosis not present

## 2022-01-24 DIAGNOSIS — R41841 Cognitive communication deficit: Secondary | ICD-10-CM | POA: Diagnosis not present

## 2022-01-24 DIAGNOSIS — Z96642 Presence of left artificial hip joint: Secondary | ICD-10-CM | POA: Diagnosis present

## 2022-01-24 DIAGNOSIS — Z888 Allergy status to other drugs, medicaments and biological substances status: Secondary | ICD-10-CM

## 2022-01-24 DIAGNOSIS — K810 Acute cholecystitis: Secondary | ICD-10-CM | POA: Diagnosis not present

## 2022-01-24 DIAGNOSIS — H409 Unspecified glaucoma: Secondary | ICD-10-CM | POA: Diagnosis present

## 2022-01-24 DIAGNOSIS — Z87820 Personal history of traumatic brain injury: Secondary | ICD-10-CM

## 2022-01-24 DIAGNOSIS — J9601 Acute respiratory failure with hypoxia: Secondary | ICD-10-CM | POA: Diagnosis present

## 2022-01-24 DIAGNOSIS — I5021 Acute systolic (congestive) heart failure: Secondary | ICD-10-CM | POA: Diagnosis not present

## 2022-01-24 DIAGNOSIS — R2689 Other abnormalities of gait and mobility: Secondary | ICD-10-CM | POA: Diagnosis not present

## 2022-01-24 DIAGNOSIS — Z66 Do not resuscitate: Secondary | ICD-10-CM | POA: Diagnosis present

## 2022-01-24 DIAGNOSIS — J9811 Atelectasis: Secondary | ICD-10-CM | POA: Diagnosis present

## 2022-01-24 DIAGNOSIS — A0472 Enterocolitis due to Clostridium difficile, not specified as recurrent: Secondary | ICD-10-CM | POA: Diagnosis present

## 2022-01-24 DIAGNOSIS — Z7901 Long term (current) use of anticoagulants: Secondary | ICD-10-CM

## 2022-01-24 DIAGNOSIS — M6281 Muscle weakness (generalized): Secondary | ICD-10-CM | POA: Diagnosis not present

## 2022-01-24 DIAGNOSIS — Z7983 Long term (current) use of bisphosphonates: Secondary | ICD-10-CM

## 2022-01-24 DIAGNOSIS — K831 Obstruction of bile duct: Secondary | ICD-10-CM | POA: Diagnosis not present

## 2022-01-24 DIAGNOSIS — E876 Hypokalemia: Secondary | ICD-10-CM | POA: Diagnosis present

## 2022-01-24 DIAGNOSIS — R251 Tremor, unspecified: Secondary | ICD-10-CM

## 2022-01-24 DIAGNOSIS — Z7189 Other specified counseling: Secondary | ICD-10-CM | POA: Diagnosis not present

## 2022-01-24 DIAGNOSIS — R531 Weakness: Secondary | ICD-10-CM | POA: Diagnosis not present

## 2022-01-24 DIAGNOSIS — Z9181 History of falling: Secondary | ICD-10-CM

## 2022-01-24 DIAGNOSIS — Z79631 Long term (current) use of antimetabolite agent: Secondary | ICD-10-CM

## 2022-01-24 DIAGNOSIS — Z881 Allergy status to other antibiotic agents status: Secondary | ICD-10-CM

## 2022-01-24 DIAGNOSIS — Z90710 Acquired absence of both cervix and uterus: Secondary | ICD-10-CM

## 2022-01-24 DIAGNOSIS — A419 Sepsis, unspecified organism: Secondary | ICD-10-CM | POA: Diagnosis not present

## 2022-01-24 DIAGNOSIS — R000 Tachycardia, unspecified: Secondary | ICD-10-CM | POA: Diagnosis not present

## 2022-01-24 DIAGNOSIS — F32A Depression, unspecified: Secondary | ICD-10-CM | POA: Diagnosis not present

## 2022-01-24 DIAGNOSIS — N183 Chronic kidney disease, stage 3 unspecified: Secondary | ICD-10-CM | POA: Diagnosis not present

## 2022-01-24 DIAGNOSIS — Z825 Family history of asthma and other chronic lower respiratory diseases: Secondary | ICD-10-CM

## 2022-01-24 LAB — CBC WITH DIFFERENTIAL/PLATELET
Abs Immature Granulocytes: 0 10*3/uL (ref 0.00–0.07)
Band Neutrophils: 3 %
Basophils Absolute: 0.1 10*3/uL (ref 0.0–0.1)
Basophils Relative: 1 %
Eosinophils Absolute: 0.3 10*3/uL (ref 0.0–0.5)
Eosinophils Relative: 3 %
HCT: 30.5 % — ABNORMAL LOW (ref 36.0–46.0)
Hemoglobin: 9.2 g/dL — ABNORMAL LOW (ref 12.0–15.0)
Lymphocytes Relative: 1 %
Lymphs Abs: 0.1 10*3/uL — ABNORMAL LOW (ref 0.7–4.0)
MCH: 31 pg (ref 26.0–34.0)
MCHC: 30.2 g/dL (ref 30.0–36.0)
MCV: 102.7 fL — ABNORMAL HIGH (ref 80.0–100.0)
Monocytes Absolute: 0.3 10*3/uL (ref 0.1–1.0)
Monocytes Relative: 3 %
Neutro Abs: 9.4 10*3/uL — ABNORMAL HIGH (ref 1.7–7.7)
Neutrophils Relative %: 89 %
Platelets: 112 10*3/uL — ABNORMAL LOW (ref 150–400)
RBC: 2.97 MIL/uL — ABNORMAL LOW (ref 3.87–5.11)
RDW: 16.3 % — ABNORMAL HIGH (ref 11.5–15.5)
WBC: 10.2 10*3/uL (ref 4.0–10.5)
nRBC: 0 % (ref 0.0–0.2)

## 2022-01-24 LAB — COMPREHENSIVE METABOLIC PANEL
ALT: 16 U/L (ref 0–44)
AST: 57 U/L — ABNORMAL HIGH (ref 15–41)
Albumin: 2.8 g/dL — ABNORMAL LOW (ref 3.5–5.0)
Alkaline Phosphatase: 28 U/L — ABNORMAL LOW (ref 38–126)
Anion gap: 8 (ref 5–15)
BUN: 24 mg/dL — ABNORMAL HIGH (ref 8–23)
CO2: 20 mmol/L — ABNORMAL LOW (ref 22–32)
Calcium: 7.5 mg/dL — ABNORMAL LOW (ref 8.9–10.3)
Chloride: 108 mmol/L (ref 98–111)
Creatinine, Ser: 1.07 mg/dL — ABNORMAL HIGH (ref 0.44–1.00)
GFR, Estimated: 48 mL/min — ABNORMAL LOW (ref >60)
Glucose, Bld: 72 mg/dL (ref 70–99)
Potassium: 4.5 mmol/L (ref 3.5–5.1)
Sodium: 136 mmol/L (ref 135–145)
Total Bilirubin: 1.3 mg/dL — ABNORMAL HIGH (ref 0.3–1.2)
Total Protein: 5.3 g/dL — ABNORMAL LOW (ref 6.5–8.1)

## 2022-01-24 LAB — TROPONIN I (HIGH SENSITIVITY): Troponin I (High Sensitivity): 2513 ng/L (ref <18)

## 2022-01-24 LAB — BILIRUBIN, FRACTIONATED(TOT/DIR/INDIR)
Bilirubin, Direct: 0.5 mg/dL — ABNORMAL HIGH (ref 0.0–0.2)
Indirect Bilirubin: 0.8 mg/dL (ref 0.3–0.9)
Total Bilirubin: 1.3 mg/dL — ABNORMAL HIGH (ref 0.3–1.2)

## 2022-01-24 LAB — PHOSPHORUS: Phosphorus: 3.2 mg/dL (ref 2.5–4.6)

## 2022-01-24 LAB — LACTIC ACID, PLASMA: Lactic Acid, Venous: 3.7 mmol/L (ref 0.5–1.9)

## 2022-01-24 LAB — PROTIME-INR
INR: 3.1 — ABNORMAL HIGH (ref 0.8–1.2)
Prothrombin Time: 31.8 seconds — ABNORMAL HIGH (ref 11.4–15.2)

## 2022-01-24 LAB — BRAIN NATRIURETIC PEPTIDE: B Natriuretic Peptide: 573.1 pg/mL — ABNORMAL HIGH (ref 0.0–100.0)

## 2022-01-24 LAB — MAGNESIUM: Magnesium: 1.5 mg/dL — ABNORMAL LOW (ref 1.7–2.4)

## 2022-01-24 MED ORDER — CITALOPRAM HYDROBROMIDE 20 MG PO TABS: 10.0000 mg | ORAL_TABLET | Freq: Every morning | ORAL | Status: DC

## 2022-01-24 MED ORDER — ONDANSETRON HCL 4 MG/2ML IJ SOLN: 4.0000 mg | Freq: Four times a day (QID) | INTRAMUSCULAR | Status: DC | PRN

## 2022-01-24 MED ORDER — FUROSEMIDE 10 MG/ML IJ SOLN
40.0000 mg | Freq: Two times a day (BID) | INTRAMUSCULAR | Status: AC
  Administered 2022-01-24 – 2022-01-25 (×2): 40 mg via INTRAVENOUS
  Filled 2022-01-24 (×2): qty 4

## 2022-01-24 MED ORDER — METOPROLOL TARTRATE 25 MG PO TABS
25.0000 mg | ORAL_TABLET | Freq: Two times a day (BID) | ORAL | Status: DC
  Administered 2022-01-24 – 2022-01-31 (×14): 25 mg via ORAL
  Filled 2022-01-24 (×14): qty 1

## 2022-01-24 MED ORDER — CALCIUM GLUCONATE-NACL 2-0.675 GM/100ML-% IV SOLN
2.0000 g | Freq: Once | INTRAVENOUS | Status: AC
  Administered 2022-01-24: 2000 mg via INTRAVENOUS
  Filled 2022-01-24: qty 100

## 2022-01-24 MED ORDER — MAGNESIUM SULFATE 4 GM/100ML IV SOLN
4.0000 g | Freq: Once | INTRAVENOUS | Status: AC
  Administered 2022-01-24: 4 g via INTRAVENOUS
  Filled 2022-01-24: qty 100

## 2022-01-24 MED ORDER — BUSPIRONE HCL 5 MG PO TABS
5.0000 mg | ORAL_TABLET | Freq: Every day | ORAL | Status: DC
  Administered 2022-01-25 – 2022-01-31 (×7): 5 mg via ORAL
  Filled 2022-01-24 (×7): qty 1

## 2022-01-24 MED ORDER — ALBUMIN HUMAN 25 % IV SOLN
25.0000 g | Freq: Four times a day (QID) | INTRAVENOUS | Status: AC
  Administered 2022-01-24 – 2022-01-25 (×3): 25 g via INTRAVENOUS
  Filled 2022-01-24 (×3): qty 100

## 2022-01-24 MED ORDER — OXYCODONE HCL 5 MG PO TABS: 5.0000 mg | ORAL_TABLET | ORAL | Status: DC | PRN

## 2022-01-24 MED ORDER — ACETAMINOPHEN 325 MG PO TABS
650.0000 mg | ORAL_TABLET | ORAL | Status: DC | PRN
  Administered 2022-01-24 – 2022-01-30 (×4): 650 mg via ORAL
  Filled 2022-01-24 (×4): qty 2

## 2022-01-24 MED ORDER — SODIUM CHLORIDE 0.9% FLUSH
3.0000 mL | Freq: Two times a day (BID) | INTRAVENOUS | Status: DC
  Administered 2022-01-25 – 2022-01-29 (×9): 3 mL via INTRAVENOUS

## 2022-01-24 MED ORDER — ACETAMINOPHEN 650 MG RE SUPP: 650.0000 mg | RECTAL | Status: DC | PRN

## 2022-01-24 MED ORDER — ONDANSETRON HCL 4 MG PO TABS: 4.0000 mg | ORAL_TABLET | Freq: Four times a day (QID) | ORAL | Status: DC | PRN

## 2022-01-24 MED ORDER — CITALOPRAM HYDROBROMIDE 20 MG PO TABS
10.0000 mg | ORAL_TABLET | Freq: Every morning | ORAL | Status: DC
  Administered 2022-01-25 – 2022-01-31 (×7): 10 mg via ORAL
  Filled 2022-01-24 (×7): qty 1

## 2022-01-24 NOTE — Assessment & Plan Note (Signed)
-   On chronic Xarelto - Hold anticoagulation in case of need for procedures -Patient states last dose was on 01/22/2022

## 2022-01-24 NOTE — H&P (Signed)
History and Physical    Alexandria Weaver  ZOX:[id number]  DOB: June 08, 1924  DOA: 01/24/2022  PCP: Alexandria Margarita, DO Patient coming from: [facility] transfer  Chief Complaint: abdominal pain  HPI:  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 She presented to [facility] ER due to N/V, abd pain.  On workup: Febrile with rectal temp 102 F, SPO2 70-90% on RA.  ABG showed pH 7.41, PCO2 39, PO2 43.  UA negative for UTI.  Initial lactic 2.9.  WBC 3.2, hemoglobin 9.7, platelets 108k.   She underwent work-up with CTA chest and CT abdomen/pelvis with contrast.  Findings were negative for PE.  Showed cardiomegaly with bilateral moderate pleural effusions and probable atelectasis bibasilar.  Enlarged mediastinal lymph nodes.  Small amount of gallbladder sludge, enlarged CBD up to 9 mm.  Left posterior rectal mass measuring 3.7 cm appearing slightly smaller than prior.  Underlying diverticulosis and small volume ascites.  She was initially placed on nonrebreather due to oxygen requirement and was weaned down to 5 L. She was treated with vancomycin and cefepime due to concern for sepsis/cholecystitis. [facility] surgeon felt ERCP may be warranted and patient was therefore recommended for transfer.  I have personally briefly reviewed patient's old medical records in [facility] and discussed patient with the ER provider when appropriate/indicated.  Assessment and Plan: * Abdominal pain - CT at [facility] concerning for GB sludge with CBD 18m - she was treated with vanc/cefepime due to concern for sepsis/acute cholecystitis and/or possible biliary obstruction - repeat labs now - obtain MRCP - hold off on further IVF as patient grossly edematous  Edema - generalized but worse in LE and abdomen; suspect from hypoalbuminemia in setting of malignancy and poor nutritional status, but also considering cardiac as well -[facility] -Start with EKG and BNP - if abnormal, will consider echo - [facility] off on further [facility]F until repeat labs are back; does appear volume overloaded in extremities and is on O2  Acute respiratory failure with hypoxia ([place] - not on O2 at home; non-smoker - check CXR - wean O2 as able - hold off on lasix until labs return   History of DVT (deep vein thrombosis) - On chronic Xarelto - Hold anticoagulation in case of need for procedures -Patient states last dose was on 01/22/2022  Tremor - hold toprol for now  Anxiety - continue buspar and celexa  Glaucoma - No eyedrops noted on med rec but await formal reconciliation  Macular degeneration - Patient follows outpatient with ophthalmology and undergoes routine injections  RA (rheumatoid arthritis) ([place] - On weekly methotrexate - Hold during hospitalization  [place]rectum ([place] - follows with cancer center (diagnosed 2020) - currently on Gleevac - last seen 01/12/22    Code Status:     Code Status: Full Code  DVT Prophylaxis:   SCDs Start: 01/24/22 1630   Anticipated disposition is to: pending clinical course and PT eval  History: Past Medical History:  Diagnosis Date   Acute deep vein thrombosis (DVT) of distal end of left lower extremity (HCC)    Anxiety    Arthritis    RA   Frequent nosebleeds    once a week   Mass in rectum    Neuropathy     Past Surgical History:  Procedure Laterality Date  ABDOMINAL HYSTERECTOMY     CATARACT EXTRACTION, BILATERAL     COLONOSCOPY WITH PROPOFOL N/A 08/11/2021   Procedure: COLONOSCOPY WITH PROPOFOL;  Surgeon: Milus Banister, MD;  Location: [facility] ENDOSCOPY;  Service: Gastroenterology;  Laterality: N/A;   EUS N/A 08/11/2021   Procedure: LOWER ENDOSCOPIC ULTRASOUND (EUS);  Surgeon: Milus Banister, MD;  Location: [facility];  Service: Gastroenterology;  Laterality: N/A;   FINE NEEDLE ASPIRATION N/A 08/11/2021    Procedure: FINE NEEDLE ASPIRATION (FNA) LINEAR;  Surgeon: Milus Banister, MD;  Location: [facility] ENDOSCOPY;  Service: Gastroenterology;  Laterality: N/A;   KYPHOPLASTY N/A 09/05/2019   Procedure: Lumbar One Kyphoplasty;  Surgeon: Ashok Pall, MD;  Location: [place];  Service: Neurosurgery;  Laterality: N/A;  3C   TOTAL HIP ARTHROPLASTY Left 2002     reports that she has never smoked. She has never used smokeless tobacco. She reports that she does not drink alcohol and does not use drugs.  Allergies  Allergen Reactions   Doxycycline Nausea Only   Leflunomide Diarrhea   Penicillins Rash    Family History  Problem Relation Age of Onset   Pneumonia Mother    Stroke Father    Factor V Leiden deficiency Daughter    Lymphoma Daughter    Asthma Son    Allergic rhinitis Son    Angioedema Neg Hx    Eczema Neg Hx    Urticaria Neg Hx    Immunodeficiency Neg Hx    Colon cancer Neg Hx    Stomach cancer Neg Hx    Esophageal cancer Neg Hx    Colon polyps Neg Hx    Home Medications: Prior to Admission medications   Medication Sig Start Date End Date Taking? Authorizing Provider  acetaminophen (TYLENOL) 500 MG tablet Take by mouth.    [provider]  busPIRone (BUSPAR) 5 MG tablet Take 5 mg by mouth daily. 04/14/21   [provider]  cetirizine (ZYRTEC) 10 MG tablet Take 1 tablet by mouth daily.    [provider]  cholecalciferol (VITAMIN D3) 25 MCG (1000 UNIT) tablet Take 1,000 Units by mouth daily.    [provider]  Cholecalciferol (VITAMIN D3) 50 MCG (2000 UT) CAPS 1 capsule Orally Once a day    [provider]  citalopram (CELEXA) 10 MG tablet Take 10 mg by mouth every morning.    [provider]  cyanocobalamin (VITAMIN B12) 1000 MCG/ML injection Inject 1,000 mcg into the muscle every 30 (thirty) days. 11/17/21   [provider]  denosumab (PROLIA) 60 MG/ML SOSY injection Inject 60 mg into the skin every 6 (six) months.     [provider]  folic acid (FOLVITE) 1 MG tablet Take 1 mg by mouth 2 (two) times daily. Does not take on Tuesday    [provider]  imatinib (GLEEVEC) 100 MG tablet Take 2 tablets (200 mg total) by mouth daily. Take with meals and large glass of water.Caution:Chemotherapy 01/13/22   Ladell Pier, MD  loratadine (CLARITIN) 10 MG tablet Take 10 mg by mouth daily.    [provider]  methotrexate 2.5 MG tablet Take 25 mg by mouth every Tuesday. 08/24/19   [provider]  metoprolol succinate (TOPROL XL) 25 MG 24 hr tablet Take 1 tablet (25 mg total) by mouth daily. 05/05/21   Sater, Nanine Means, MD  rivaroxaban (XARELTO) 10 MG TABS tablet Take 10 mg by mouth daily with supper. 08/03/15   [provider]  triamcinolone cream (  KENALOG) 0.1 % Apply to affected areas twice dailyi for up to 1 week to 2wks External Patient not taking: Reported on 12/15/2021    [provider]    Review of Systems:  Review of Systems  Constitutional:  Positive for chills and diaphoresis. Negative for fever.  HENT: Negative.    Eyes: Negative.   Respiratory: Negative.    Cardiovascular:  Positive for leg swelling.  Gastrointestinal:  Positive for abdominal pain and vomiting.  Genitourinary: Negative.   Musculoskeletal: Negative.   Skin: Negative.   Neurological:  Positive for tremors.  Endo/Heme/Allergies: Negative.   Psychiatric/Behavioral: Negative.      Physical Exam:  Vitals:   01/24/22 1500 01/24/22 1603  BP: (!) 127/57   Pulse: (!) 106   Resp: 18 20  Temp: 98.8 F (37.1 C)   TempSrc: Oral   SpO2: 97%   Weight:  66.5 kg  Height:  '5\' 2"'$  (1.575 m)   Physical Exam Constitutional:      Comments: Pleasant elderly woman with slight hard of hearing resting in bed comfortable and in no distress  HENT:     Head: Normocephalic and atraumatic.     Mouth/Throat:     Mouth: Mucous membranes are moist.  Eyes:     Extraocular Movements: Extraocular  movements intact.  Cardiovascular:     Rate and Rhythm: Normal rate. Rhythm irregular.  Pulmonary:     Effort: Pulmonary effort is normal. No respiratory distress.     Breath sounds: Normal breath sounds. No wheezing.  Abdominal:     Comments: Edematous abdomen with mild distention.  Tenderness in the right quadrants with no rebound or guarding.  Unable to elicit true Murphy sign.  Hypoactive bowel sounds  Musculoskeletal:        General: Swelling present.     Cervical back: Normal range of motion and neck supple.     Comments: Generalized edema throughout, worse in the lower extremities and abdomen  Skin:    General: Skin is warm and dry.  Neurological:     General: No focal deficit present.  Psychiatric:        Mood and Affect: Mood normal.      Labs on Admission:  I have personally reviewed following labs and imaging studies No results found for this or any previous visit (from the past 24 hour(s)).   Radiological Exams on Admission: No results found. MR ABDOMEN WITH MRCP W CONTRAST    (Results Pending)  DG CHEST PORT 1 VIEW    (Results Pending)    Consults called:    EKG: Pending   Dwyane Dee, MD [facility] 01/24/2022, 4:45 PM

## 2022-01-24 NOTE — Hospital Course (Addendum)
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 She presented to [facility] ER due to N/V, abd pain.   She states that she has had some "shaking" at home.  She vomited on Sunday and has chronic intermittent diarrhea.  She also endorses having felt hot/sweaty.  On workup: Febrile with rectal temp 102 F, SPO2 70-90% on RA.  ABG showed pH 7.41, PCO2 39, PO2 43.  UA negative for UTI.  Initial lactic 2.9.  WBC 3.2, hemoglobin 9.7, platelets 108k.   She underwent work-up with CTA chest and CT abdomen/pelvis with contrast.  Findings were negative for PE.  Showed cardiomegaly with bilateral moderate pleural effusions and probable atelectasis bibasilar.  Enlarged mediastinal lymph nodes.  Small amount of gallbladder sludge, enlarged CBD up to 9 mm.  Left posterior rectal mass measuring 3.7 cm appearing slightly smaller than prior.  Underlying diverticulosis and small volume ascites.  She was initially placed on nonrebreather due to oxygen requirement and was weaned down to 5 L. She was treated with vancomycin and cefepime due to concern for sepsis/cholecystitis. [facility] surgeon felt ERCP may be warranted and patient was therefore recommended for transfer.

## 2022-01-24 NOTE — Assessment & Plan Note (Addendum)
-   generalized but worse in LE and abdomen; suspect from hypoalbuminemia in setting of malignancy and poor nutritional status, but also considering cardiac as well -Only echo from system is 2016 at [facility] and unable to see report -Start with EKG and BNP - if abnormal, will consider echo - creat was up some at [facility]; hold off on further IVF until repeat labs are back; does appear volume overloaded in extremities and is on O2

## 2022-01-24 NOTE — Assessment & Plan Note (Addendum)
-   continue buspar and celexa

## 2022-01-24 NOTE — Assessment & Plan Note (Signed)
-   CT at [facility] concerning for GB sludge with CBD 55m - she was treated with vanc/cefepime due to concern for sepsis/acute cholecystitis and/or possible biliary obstruction - repeat labs now - obtain MRCP - hold off on further IVF as patient grossly edematous

## 2022-01-24 NOTE — Consult Note (Signed)
Referring Physician:James Daniels/David Girguis  S[place]ha [place] is an 88 y.o. female.                       Chief Complaint: Abdominal pain with elevated BNP and Troponin I  HPI: 88 years old white female with PMH of DVT on Xarelto, SDH post fall in June 2023, Rheumatoid Arthritis on MTX and Gastrointestinal stromal tumor (on Gleevac) has nausea and epigastric pain. EKG showed possible recent inferior wall MI, sinus tachycardia with frequent APCs, CXR showed pulmonary vascular congestion. Troponin I was 2513 ng and BNP was 573 pg.   Past Medical History:  Diagnosis Date   Acute deep vein thrombosis (DVT) of distal end of left lower extremity ([place]) 02/07/2005   Anxiety 01/03/2021   Arthritis    RA   Frequent nosebleeds    once a week   Mass in rectum 06/09/2020   Neuropathy 01/06/2009      Past Surgical History:  Procedure Laterality Date   ABDOMINAL HYSTERECTOMY     CATARACT EXTRACTION, BILATERAL     COLONOSCOPY WITH PROPOFOL N/A 08/11/2021   Procedure: COLONOSCOPY WITH PROPOFOL;  Surgeon: Milus Banister, MD;  Location: [facility];  Service: Gastroenterology;  Laterality: N/A;   EUS N/A 08/11/2021   Procedure: LOWER ENDOSCOPIC ULTRASOUND (EUS);  Surgeon: Milus Banister, MD;  Location: [facility];  Service: Gastroenterology;  Laterality: N/A;   FINE NEEDLE ASPIRATION N/A 08/11/2021   Procedure: FINE NEEDLE ASPIRATION (FNA) LINEAR;  Surgeon: Milus Banister, MD;  Location: [facility];  Service: Gastroenterology;  Laterality: N/A;   KYPHOPLASTY N/A 09/05/2019   Procedure: Lumbar One Kyphoplasty;  Surgeon: Ashok Pall, MD;  Location: [facility];  Service: Neurosurgery;  Laterality: N/A;  3C   TOTAL HIP ARTHROPLASTY Left 2002    Family History  Problem Relation Age of Onset   Pneumonia Mother    Stroke Father    Factor V Leiden deficiency Daughter    Lymphoma Daughter    Asthma Son    Allergic rhinitis Son    Angioedema Neg Hx    Eczema Neg Hx    Urticaria Neg Hx     Immunodeficiency Neg Hx    Colon cancer Neg Hx    Stomach cancer Neg Hx    Esophageal cancer Neg Hx    Colon polyps Neg Hx    Social History:  reports that she has never smoked. She has never used smokeless tobacco. She reports that she does not drink alcohol and does not use drugs.  Allergies:  Allergies  Allergen Reactions   Doxycycline Nausea Only   Leflunomide Diarrhea   Tape Other (See Comments)    Irritates the skin- SKIN TEARS AND BRUISES VERY EASILY!!   Penicillins Rash    Medications Prior to Admission  Medication Sig Dispense Refill   acetaminophen (TYLENOL) 500 MG tablet Take 1,000 mg by mouth at bedtime.     busPIRone (BUSPAR) 5 MG tablet Take 5 mg by mouth in the morning.     cholecalciferol (VITAMIN D3) 25 MCG (1000 UNIT) tablet Take 1,000 Units by mouth daily.     citalopram (CELEXA) 20 MG tablet Take 10 mg by mouth in the morning.     cyanocobalamin (VITAMIN B12) 1000 MCG/ML injection Inject 1,000 mcg into the muscle every 30 (thirty) days.     denosumab (PROLIA) 60 MG/ML SOSY injection Inject 60 mg into the skin every 6 (six) months.     folic acid (FOLVITE) 1  MG tablet Take 1 mg by mouth See admin instructions. Take 1 mg by mouth in the morning and at bedtime on Sun/Mon/Wed/Thurs/Fri/Sat- NOT on Tuesday     imatinib (GLEEVEC) 100 MG tablet Take 2 tablets (200 mg total) by mouth daily. Take with meals and large glass of water.Caution:Chemotherapy (Patient taking differently: Take 100 mg by mouth See admin instructions. Take 100 mg by mouth in the morning with breakfast and in the evening with supper, with a large glass of water.Caution:Chemotherapy) 60 tablet 0   loratadine (CLARITIN) 10 MG tablet Take 10 mg by mouth daily.     methotrexate 2.5 MG tablet Take 12.5 mg by mouth See admin instructions. Take 12.5 mg by mouth in the morning and at bedtime on Tuesday ONLY     metoprolol succinate (TOPROL XL) 25 MG 24 hr tablet Take 1 tablet (25 mg total) by mouth daily.  (Patient taking differently: Take 12.5 mg by mouth in the morning.) 30 tablet 5   rivaroxaban (XARELTO) 10 MG TABS tablet Take 10 mg by mouth daily with supper.  0   citalopram (CELEXA) 10 MG tablet Take 10 mg by mouth every morning. (Patient not taking: Reported on 01/24/2022)     triamcinolone cream (KENALOG) 0.1 % Apply to affected areas twice dailyi for up to 1 week to 2wks External (Patient not taking: Reported on 12/15/2021)      Results for orders placed or performed during the hospital encounter of 01/24/22 (from the past 48 hour(s))  Brain natriuretic peptide     Status: Abnormal   Collection Time: 01/24/22  4:40 PM  Result Value Ref Range   B Natriuretic Peptide 573.1 (H) 0.0 - 100.0 pg/mL    Comment: Performed at [facility], [place] [address] [place], [place] [address]  Bilirubin, fractionated(tot/dir/indir)     Status: Abnormal   Collection Time: 01/24/22  4:44 PM  Result Value Ref Range   Total Bilirubin 1.3 (H) 0.3 - 1.2 mg/dL   Bilirubin, Direct 0.5 (H) 0.0 - 0.2 mg/dL   Indirect Bilirubin 0.8 0.3 - 0.9 mg/dL    Comment: Performed at [facility], [place] [address] [place], [place] [address]  Comprehensive metabolic panel     Status: Abnormal   Collection Time: 01/24/22  4:46 PM  Result Value Ref Range   Sodium 136 135 - 145 mmol/L   Potassium 4.5 3.5 - 5.1 mmol/L   Chloride 108 98 - 111 mmol/L   CO2 20 (L) 22 - 32 mmol/L   Glucose, Bld 72 70 - 99 mg/dL    Comment: Glucose reference range applies only to samples taken after fasting for at least 8 hours.   BUN 24 (H) 8 - 23 mg/dL   Creatinine, Ser 1.07 (H) 0.44 - 1.00 mg/dL   Calcium 7.5 (L) 8.9 - 10.3 mg/dL   Total Protein 5.3 (L) 6.5 - 8.1 g/dL   Albumin 2.8 (L) 3.5 - 5.0 g/dL   AST 57 (H) 15 - 41 U/L   ALT 16 0 - 44 U/L   Alkaline Phosphatase 28 (L) 38 - 126 U/L   Total Bilirubin 1.3 (H) 0.3 - 1.2 mg/dL   GFR, Estimated 48 (L) >60 mL/min    Comment: (NOTE) Calculated using the  CKD-EPI Creatinine Equation (2021)    Anion gap 8 5 - 15    Comment: Performed at [facility], [place] [address], [place], [place] [address]  CBC with Differential/Platelet     Status:  Abnormal   Collection Time: 01/24/22  4:46 PM  Result Value Ref Range   WBC 10.2 4.0 - 10.5 K/uL   RBC 2.97 (L) 3.87 - 5.11 MIL/uL   Hemoglobin 9.2 (L) 12.0 - 15.0 g/dL   HCT 30.5 (L) 36.0 - 46.0 %   MCV 102.7 (H) 80.0 - 100.0 fL   MCH 31.0 26.0 - 34.0 pg   MCHC 30.2 30.0 - 36.0 g/dL   RDW 16.3 (H) 11.5 - 15.5 %   Platelets 112 (L) 150 - 400 K/uL   nRBC 0.0 0.0 - 0.2 %   Neutrophils Relative % 89 %   Neutro Abs 9.4 (H) 1.7 - 7.7 K/uL   Band Neutrophils 3 %   Lymphocytes Relative 1 %   Lymphs Abs 0.1 (L) 0.7 - 4.0 K/uL   Monocytes Relative 3 %   Monocytes Absolute 0.3 0.1 - 1.0 K/uL   Eosinophils Relative 3 %   Eosinophils Absolute 0.3 0.0 - 0.5 K/uL   Basophils Relative 1 %   Basophils Absolute 0.1 0.0 - 0.1 K/uL   WBC Morphology MILD LEFT SHIFT (1-5% METAS, OCC MYELO, OCC BANDS)    Abs Immature Granulocytes 0.00 0.00 - 0.07 K/uL   Ovalocytes PRESENT     Comment: Performed at [facility], [place] [address] [place], [place] [address]  Magnesium     Status: Abnormal   Collection Time: 01/24/22  4:46 PM  Result Value Ref Range   Magnesium 1.5 (L) 1.7 - 2.4 mg/dL    Comment: Performed at [facility], [place] [address], [place], [place] [address]  Phosphorus     Status: None   Collection Time: 01/24/22  4:46 PM  Result Value Ref Range   Phosphorus 3.2 2.5 - 4.6 mg/dL    Comment: Performed at [facility], [place] [address] [place], [place] [address]  Lactic acid, plasma     Status: Abnormal   Collection Time: 01/24/22  4:46 PM  Result Value Ref Range   Lactic Acid, Venous 3.7 (HH) 0.5 - 1.9 mmol/L    Comment: CRITICAL RESULT CALLED TO, READ BACK BY AND VERIFIED WITH M.MOLL, RN AT 1831 ON 10.17.23 BY N.THOMPSON Performed at  [facility], [place] [address] [place], [place] [address]   Protime-INR     Status: Abnormal   Collection Time: 01/24/22  4:46 PM  Result Value Ref Range   Prothrombin Time 31.8 (H) 11.4 - 15.2 seconds   INR 3.1 (H) 0.8 - 1.2    Comment: (NOTE) INR goal varies based on device and disease states. Performed at [facility], [place] [address] [place], [place] [address]   Troponin I (High Sensitivity)     Status: Abnormal   Collection Time: 01/24/22  4:46 PM  Result Value Ref Range   Troponin I (High Sensitivity) 2,513 (HH) <18 ng/L    Comment: CRITICAL RESULT CALLED TO, READ BACK BY AND VERIFIED WITH C.GORRELL, RN AT 1913 ON 10.17.23 BY N.THOMPSON (NOTE) Elevated high sensitivity troponin I (hsTnI) values and significant  changes across serial measurements may suggest ACS but many other  chronic and acute conditions are known to elevate hsTnI results.  Refer to the "Links" section for chest pain algorithms and additional  guidance. Performed at [facility], [place] [address] [place], [place] [address]    DG CHEST PORT 1 VIEW  Result Date: 01/24/2022 CLINICAL DATA:  Acute hypoxemic respiratory failure EXAM: PORTABLE CHEST 1 VIEW  COMPARISON:  Yesterday from [facility] rocking ham FINDINGS: Numerous leads and wires project over the chest. Midline trachea. Mild cardiomegaly. Small right and trace left pleural effusions, similar given differences in technique. No pneumothorax. Suspect underlying pulmonary venous congestion, accentuated by low lung volumes. Slightly worsened right greater than left base airspace disease. Upper lumbar vertebral augmentation. IMPRESSION: Slightly worsened aeration with diminished lung volumes and progressive bibasilar airspace disease. Pulmonary venous congestion with similar small bilateral pleural effusions. Electronically Signed   By: Abigail Miyamoto M.D.   On: 01/24/2022 17:40    Review Of Systems Constitutional:  No fever, chills, weight loss or gain. Eyes: No vision change, wears glasses. No discharge or pain. Ears: No hearing loss, No tinnitus. Respiratory: No asthma, COPD, pneumonias. Positive shortness of breath. No hemoptysis. Cardiovascular: Positive chest pain, palpitation, leg edema. Gastrointestinal: Positive nausea, vomiting, diarrhea, constipation. No GI bleed. No hepatitis. Genitourinary: No dysuria, hematuria, kidney stone. No incontinance. Neurological: No headache, stroke, seizures.  Psychiatry: No psych facility admission for anxiety, depression, suicide. No detox. Skin: No rash. Musculoskeletal: Positive joint pain, fibromyalgia, neck pain, back pain. Lymphadenopathy: No lymphadenopathy. Hematology: Positive anemia or easy bruising.   Blood pressure (!) 147/57, pulse 97, temperature 99.5 F (37.5 C), resp. rate 16, height '5\' 2"'$  (1.575 m), weight 66.5 kg, SpO2 96 %. Body mass index is 26.81 kg/m. General appearance: alert, cooperative, appears stated age and mild respiratory distress Head: Normocephalic, atraumatic. Eyes: Blue eyes, pale conjunctiva, corneas clear.  Neck: No adenopathy, no carotid bruit, no JVD, supple, symmetrical, trachea midline and thyroid not enlarged. Resp: Clearing to auscultation bilaterally. Cardio: Irregular rate and rhythm, S1, S2 normal, II/VI systolic murmur, no click, rub or gallop GI: Soft, non-tender; bowel sounds normal; no organomegaly. Extremities: Trace edema, no cyanosis or clubbing. Skin: Warm and dry.  Neurologic: Alert and oriented X 3, normal strength.   Assessment/Plan Acute coronary syndrome Acute systolic left heart failure HTN Rheumatoid arthritis Anemia of chronic disease CKD, II Abdominal pain S/P Rectal stromal malinancy  Plan: Supportive care. Agree with IV lasix as tolerated. Awaiting echocardiogram. Add small dose B-blocker for sinus tachycardia.  Time spent: Review of old records, Lab, x-rays, EKG, other cardiac  tests, examination, discussion with patient/Nurse/Doctor over 70 minutes.  Birdie Riddle, MD  01/24/2022, 9:04 PM

## 2022-01-24 NOTE — Assessment & Plan Note (Signed)
-   not on O2 at home; non-smoker - check CXR - wean O2 as able - hold off on lasix until labs return

## 2022-01-24 NOTE — Assessment & Plan Note (Signed)
-   follows with cancer center (diagnosed 2020) - currently on Gleevac - last seen 01/12/22

## 2022-01-24 NOTE — Assessment & Plan Note (Signed)
-   Patient follows outpatient with ophthalmology and undergoes routine injections

## 2022-01-24 NOTE — Assessment & Plan Note (Signed)
-   On weekly methotrexate - Hold during hospitalization

## 2022-01-24 NOTE — Assessment & Plan Note (Signed)
-   hold toprol for now

## 2022-01-24 NOTE — Progress Notes (Signed)
    OVERNIGHT PROGRESS REPORT  Notified by attending for concern over potential/worsening heart failure. In reference to discussion, advised to notify Cardiology for consult.  Consult completed to Dr Doylene Canard who will be in to see the patient.     Admitting HPI Patient coming from: [facility] transfer   Chief Complaint: abdominal pain   HPI:  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 She presented to [facility] ER due to N/V, abd pain.  She underwent work-up with CTA chest and CT abdomen/pelvis with contrast.  Findings were negative for PE.  Showed cardiomegaly with bilateral moderate pleural effusions and probable atelectasis bibasilar.  Enlarged mediastinal lymph nodes.  Small amount of gallbladder sludge, enlarged CBD up to 9 mm.  Left posterior rectal mass measuring 3.7 cm appearing slightly smaller than prior.  Underlying diverticulosis and Small volume ascites.   She was initially on nonrebreather and has been weaned down to 5 L. Most recent labs:    Latest Reference Range & Units 01/24/22 16:46  COMPREHENSIVE METABOLIC PANEL  Rpt !  Sodium 135 - 145 mmol/L 136  Potassium 3.5 - 5.1 mmol/L 4.5  Chloride 98 - 111 mmol/L 108  CO2 22 - 32 mmol/L 20 (L)  Glucose 70 - 99 mg/dL 72  BUN 8 - 23 mg/dL 24 (H)  Creatinine 0.44 - 1.00 mg/dL 1.07 (H)  Calcium 8.9 - 10.3 mg/dL 7.5 (L)  Anion gap 5 - 15  8  Phosphorus 2.5 - 4.6 mg/dL 3.2  Magnesium 1.7 - 2.4 mg/dL 1.5 (L)  Alkaline Phosphatase 38 - 126 U/L 28 (L)  Albumin 3.5 - 5.0 g/dL 2.8 (L)  AST 15 - 41 U/L 57 (H)  ALT 0 - 44 U/L 16  Total Protein 6.5 - 8.1 g/dL 5.3 (L)  Total Bilirubin 0.3 - 1.2 mg/dL 1.3 (H)  GFR, Estimated >60 mL/min 48 (L)  Troponin I (High Sensitivity) <18 ng/L 2,513 (HH)  Lactic Acid, Venous 0.5 - 1.9 mmol/L 3.7 (HH)  WBC 4.0 - 10.5 K/uL 10.2  RBC 3.87 - 5.11 MIL/uL 2.97 (L)  Hemoglobin 12.0 - 15.0 g/dL 9.2 (L)  HCT 36.0 - 46.0 %  30.5 (L)  MCV 80.0 - 100.0 fL 102.7 (H)  MCH 26.0 - 34.0 pg 31.0  MCHC 30.0 - 36.0 g/dL 30.2  RDW 11.5 - 15.5 % 16.3 (H)  Platelets 150 - 400 K/uL 112 (L)  nRBC 0.0 - 0.2 % 0.0  Neutrophils % 89  Lymphocytes % 1  Monocytes Relative % 3  Eosinophil % 3  Basophil % 1  NEUT# 1.7 - 7.7 K/uL 9.4 (H)  Lymphocyte # 0.7 - 4.0 K/uL 0.1 (L)  Monocyte # 0.1 - 1.0 K/uL 0.3  Eosinophils Absolute 0.0 - 0.5 K/uL 0.3  Basophils Absolute 0.0 - 0.1 K/uL 0.1  Abs Immature Granulocytes 0.00 - 0.07 K/uL 0.00  Band Neutrophils % 3  Ovalocytes  PRESENT  WBC Morphology  MILD LEFT SHIFT (1-5% METAS, OCC MYELO, OCC BANDS)  Prothrombin Time 11.4 - 15.2 seconds 31.8 (H)  INR 0.8 - 1.2  3.1 (H)  (HH): Data is critically high !: Data is abnormal (L): Data is abnormally low (H): Data is abnormally high Rpt: View report in Results Review for more information  Thanks to Dr Doylene Canard for consult.    Gershon Cull MSNA MSN ACNPC-AG Acute Care Nurse Practitioner [place]

## 2022-01-24 NOTE — Progress Notes (Signed)
Pt arrived via stretcher with EMS from [facility]. She is alert/oriented x 4. Pt presented with firm, but slightly pitting edema, most notably around buttocks & coccyx area. Pt given complete bath, peri care & covered with moisture barrier cream r/t moisture associated skin redness. Covered buttocks with allyvn & heals, where there is also some edema, but slight redness. Turned pt to left side with pillow support.  20 G IV already intact in LFA. Flushes well. While admitting pt to unit, variable critical labs were called to documenting RN. Dr. Dwyane Dee was notified of each, immediately following notification from lab. Lactic 2.7, BNP 537.1, & Troponin 2,513. During assessment, daughter (also pt's POA) & pt, both confirmed that they did not want full resuscitation in the event of cardiac arrest. Notified Dr. Sabino Gasser immediately, who requested DNR status be placed. Order executed. Pt was administered IV Ca+, Mg+ replacements, & lasix. Albumin still needed, but requested night shift to administer. Due to pt's card. Presentation, BNP, etc. Turned Ca+ infusion down to 50 ml/hr (versus 100 ml/hr) while administering Mg+ at 50 ml/hr, for total rate to be decreased from 150 ml/hr to 100 ml/hr. While daughter was still present, Marcy Siren from MRI reviewed questions for testing to expedite process. After family left, pt was given tylenol, & re-positioned to comfort, with turning on left side, from right. All updates given to oncoming nurse, Glass blower/designer.

## 2022-01-24 NOTE — Assessment & Plan Note (Signed)
-   No eyedrops noted on med rec but await formal reconciliation

## 2022-01-25 ENCOUNTER — Inpatient Hospital Stay (HOSPITAL_COMMUNITY): Payer: Medicare Other

## 2022-01-25 ENCOUNTER — Other Ambulatory Visit: Payer: Self-pay

## 2022-01-25 DIAGNOSIS — J9601 Acute respiratory failure with hypoxia: Secondary | ICD-10-CM | POA: Diagnosis not present

## 2022-01-25 DIAGNOSIS — Z86718 Personal history of other venous thrombosis and embolism: Secondary | ICD-10-CM | POA: Diagnosis not present

## 2022-01-25 DIAGNOSIS — R109 Unspecified abdominal pain: Secondary | ICD-10-CM | POA: Diagnosis not present

## 2022-01-25 DIAGNOSIS — R609 Edema, unspecified: Secondary | ICD-10-CM | POA: Diagnosis not present

## 2022-01-25 LAB — CBC WITH DIFFERENTIAL/PLATELET
Abs Immature Granulocytes: 0.2 10*3/uL — ABNORMAL HIGH (ref 0.00–0.07)
Band Neutrophils: 13 %
Basophils Absolute: 0 10*3/uL (ref 0.0–0.1)
Basophils Relative: 0 %
Eosinophils Absolute: 0.3 10*3/uL (ref 0.0–0.5)
Eosinophils Relative: 3 %
HCT: 29.3 % — ABNORMAL LOW (ref 36.0–46.0)
Hemoglobin: 9.1 g/dL — ABNORMAL LOW (ref 12.0–15.0)
Lymphocytes Relative: 5 %
Lymphs Abs: 0.5 10*3/uL — ABNORMAL LOW (ref 0.7–4.0)
MCH: 31.5 pg (ref 26.0–34.0)
MCHC: 31.1 g/dL (ref 30.0–36.0)
MCV: 101.4 fL — ABNORMAL HIGH (ref 80.0–100.0)
Monocytes Absolute: 0.3 10*3/uL (ref 0.1–1.0)
Monocytes Relative: 3 %
Myelocytes: 2 %
Neutro Abs: 8.8 10*3/uL — ABNORMAL HIGH (ref 1.7–7.7)
Neutrophils Relative %: 74 %
Platelets: 126 10*3/uL — ABNORMAL LOW (ref 150–400)
RBC: 2.89 MIL/uL — ABNORMAL LOW (ref 3.87–5.11)
RDW: 16.4 % — ABNORMAL HIGH (ref 11.5–15.5)
WBC: 10.1 10*3/uL (ref 4.0–10.5)
nRBC: 0 % (ref 0.0–0.2)

## 2022-01-25 LAB — ECHOCARDIOGRAM COMPLETE
Area-P 1/2: 2.33 cm2
Calc EF: 54 %
Height: 62 in
S' Lateral: 3.5 cm
Single Plane A2C EF: 49.1 %
Single Plane A4C EF: 56.8 %
Weight: 2345.69 oz

## 2022-01-25 LAB — COMPREHENSIVE METABOLIC PANEL
ALT: 18 U/L (ref 0–44)
AST: 60 U/L — ABNORMAL HIGH (ref 15–41)
Albumin: 3.4 g/dL — ABNORMAL LOW (ref 3.5–5.0)
Alkaline Phosphatase: 30 U/L — ABNORMAL LOW (ref 38–126)
Anion gap: 9 (ref 5–15)
BUN: 31 mg/dL — ABNORMAL HIGH (ref 8–23)
CO2: 23 mmol/L (ref 22–32)
Calcium: 8.5 mg/dL — ABNORMAL LOW (ref 8.9–10.3)
Chloride: 107 mmol/L (ref 98–111)
Creatinine, Ser: 1.12 mg/dL — ABNORMAL HIGH (ref 0.44–1.00)
GFR, Estimated: 46 mL/min — ABNORMAL LOW (ref >60)
Glucose, Bld: 94 mg/dL (ref 70–99)
Potassium: 4 mmol/L (ref 3.5–5.1)
Sodium: 139 mmol/L (ref 135–145)
Total Bilirubin: 1.2 mg/dL (ref 0.3–1.2)
Total Protein: 6 g/dL — ABNORMAL LOW (ref 6.5–8.1)

## 2022-01-25 LAB — C DIFFICILE QUICK SCREEN W PCR REFLEX
C Diff antigen: POSITIVE — AB
C Diff toxin: NEGATIVE

## 2022-01-25 LAB — LACTIC ACID, PLASMA: Lactic Acid, Venous: 2.5 mmol/L (ref 0.5–1.9)

## 2022-01-25 LAB — MAGNESIUM: Magnesium: 2.6 mg/dL — ABNORMAL HIGH (ref 1.7–2.4)

## 2022-01-25 LAB — CLOSTRIDIUM DIFFICILE BY PCR, REFLEXED: Toxigenic C. Difficile by PCR: POSITIVE — AB

## 2022-01-25 MED ORDER — FUROSEMIDE 10 MG/ML IJ SOLN: 20.0000 mg | Freq: Two times a day (BID) | INTRAMUSCULAR | Status: DC

## 2022-01-25 MED ORDER — GADOBUTROL 1 MMOL/ML IV SOLN: 7.0000 mL | Freq: Once | INTRAVENOUS | Status: DC | PRN

## 2022-01-25 MED ORDER — FUROSEMIDE 10 MG/ML IJ SOLN
20.0000 mg | Freq: Two times a day (BID) | INTRAMUSCULAR | Status: DC
  Administered 2022-01-25: 20 mg via INTRAVENOUS
  Filled 2022-01-25: qty 2

## 2022-01-25 NOTE — Progress Notes (Signed)
Pt brought down to MRI. Pt allowed MRI to scan about half of her ABD MRI. Pt began pulling coil off of her and screaming wanting out of the scanner. Images that were obtained were sent through for radiologist interpreting.

## 2022-01-25 NOTE — Progress Notes (Signed)
Patient pulled out peripheral IV and she refused a new one to be put in by both RN and IV team.

## 2022-01-25 NOTE — TOC Initial Note (Signed)
Transition of Care [facility]) - Initial/Assessment Note    Patient Details  Name: Alexandria Weaver MRN: [id number] Date of Birth: Jul 12, 1924  Transition of Care [facility] CM/SW Contact:    Leeroy Cha, RN Phone Number: 01/25/2022, 7:40 AM  Clinical Narrative:                  Transition of Care [facility]) Screening Note   Patient Details  Name: Alexandria Weaver Date of Birth: May 14, 1924   Transition of Care [facility]) CM/SW Contact:    Leeroy Cha, RN Phone Number: 01/25/2022, 7:40 AM    Transition of Care Department [facility]) has reviewed patient and no TOC needs have been identified at this time. We will continue to monitor patient advancement through interdisciplinary progression rounds. If new patient transition needs arise, please place a TOC consult.    Expected Discharge Plan: Home/Self Care Barriers to Discharge: Continued Medical Work up   Patient Goals and CMS Choice Patient states their goals for this hospitalization and ongoing recovery are:: to go home      Expected Discharge Plan and Services Expected Discharge Plan: Home/Self Care   Discharge Planning Services: CM Consult   Living arrangements for the past 2 months: Apartment                                      Prior Living Arrangements/Services Living arrangements for the past 2 months: Apartment Lives with:: Self   Do you feel safe going back to the place where you live?: Yes            Criminal Activity/Legal Involvement Pertinent to Current Situation/Hospitalization: No - Comment as needed  Activities of Daily Living Home Assistive Devices/Equipment: Walker (specify type), Grab bars in shower, Grab bars around toilet, Eyeglasses, Dentures (specify type) (Raised toilet seat) ADL Screening (condition at time of admission) Patient's cognitive ability adequate to safely complete daily activities?: Yes Is the patient deaf or have difficulty hearing?: Yes Does the patient have difficulty  seeing, even when wearing glasses/contacts?: Yes Does the patient have difficulty concentrating, remembering, or making decisions?: No Patient able to express need for assistance with ADLs?: Yes Does the patient have difficulty dressing or bathing?: Yes Independently performs ADLs?: No Communication: Independent Dressing (OT): Independent Grooming: Dependent Is this a change from baseline?: Change from baseline, expected to last >3 days Feeding: Independent Bathing: Dependent Is this a change from baseline?: Change from baseline, expected to last <3 days Toileting: Needs assistance Is this a change from baseline?: Change from baseline, expected to last >3days In/Out Bed: Needs assistance Is this a change from baseline?: Change from baseline, expected to last >3 days Walks in Home: Independent with device (comment) Does the patient have difficulty walking or climbing stairs?: Yes Weakness of Legs: Right Weakness of Arms/Hands: Left  Permission Sought/Granted                  Emotional Assessment Appearance:: Appears stated age Attitude/Demeanor/Rapport: Engaged   Orientation: : Oriented to  Time, Oriented to Situation, Oriented to Place Alcohol / Substance Use: Never Used    Admission diagnosis:  Abdominal pain [R10.9] Patient Active Problem List   Diagnosis Date Noted   Abdominal pain 01/24/2022   GIST-rectum ([place]) 01/24/2022   History of DVT (deep vein thrombosis) 01/24/2022   RA (rheumatoid arthritis) ([place]) 01/24/2022   Macular degeneration 01/24/2022   Glaucoma 01/24/2022   Edema 01/24/2022  Acute respiratory failure with hypoxia ([place]) 01/24/2022   Anxiety 01/24/2022   Tremor 01/24/2022   Benign essential tremor 05/05/2021   Gait disturbance 05/05/2021   Syncope 05/05/2021   Closed compression fracture of body of L1 vertebra ([place]) 09/05/2019   PCP:  Sueanne Margarita, DO Pharmacy:   [place], [place] [address] [place]  [place] [address] Phone: [phone] Fax: [phone]     Social Determinants of Health (SDOH) Interventions Utilities Interventions: Intervention Not Indicated Alcohol Usage Interventions: Intervention Not Indicated (Score <7) Financial Strain Interventions: Intervention Not Indicated Stress Interventions: Intervention Not Indicated Social Connections Interventions: Intervention Not Indicated  Readmission Risk Interventions   No data to display

## 2022-01-25 NOTE — Progress Notes (Signed)
Pt refused to be stuck for PIV insertion.  Bedside RN was made aware of pt's refusal.

## 2022-01-25 NOTE — Consult Note (Signed)
Ref: Sueanne Margarita, DO   Subjective:  Awake. Feeling better. No chest pain.HR near 100/min.  Objective:  Vital Signs in the last 24 hours: Temp:  [98.7 F (37.1 C)-99.5 F (37.5 C)] 98.7 F (37.1 C) (10/18 0216) Pulse Rate:  [96-106] 96 (10/18 0216) Cardiac Rhythm: Sinus tachycardia (10/18 0743) Resp:  [16-20] 16 (10/18 0216) BP: (127-147)/(50-57) 130/50 (10/18 0216) SpO2:  [87 %-99 %] 99 % (10/18 0216) Weight:  [66.5 kg] 66.5 kg (10/17 1603)  Physical Exam: BP Readings from Last 1 Encounters:  01/25/22 (!) 130/50     Wt Readings from Last 1 Encounters:  01/24/22 66.5 kg    Weight change:  Body mass index is 26.81 kg/m. HEENT: [place]/AT, Eyes-Blue, Conjunctiva-Pale, Sclera-Non-icteric Neck: No JVD, No bruit, Trachea midline. Lungs:  Clearing, Bilateral. Cardiac:  Regular rhythm, normal S1 and S2, no S3. II/VI systolic murmur. Abdomen:  Soft, non-tender. BS present. Extremities:  No edema present. No cyanosis. No clubbing. CNS: AxOx3, Cranial nerves grossly intact, moves all 4 extremities.  Skin: Warm and dry.   Intake/Output from previous day: 10/17 0701 - 10/18 0700 In: 84.6 [IV Piggyback:84.6] Out: 300 [Urine:300]    Lab Results: BMET    Component Value Date/Time   NA 139 01/25/2022 0658   NA 136 01/24/2022 1646   NA 138 01/12/2022 0941   K 4.0 01/25/2022 0658   K 4.5 01/24/2022 1646   K 5.1 01/12/2022 0941   CL 107 01/25/2022 0658   CL 108 01/24/2022 1646   CL 104 01/12/2022 0941   CO2 23 01/25/2022 0658   CO2 20 (L) 01/24/2022 1646   CO2 25 01/12/2022 0941   GLUCOSE 94 01/25/2022 0658   GLUCOSE 72 01/24/2022 1646   GLUCOSE 83 01/12/2022 0941   BUN 31 (H) 01/25/2022 0658   BUN 24 (H) 01/24/2022 1646   BUN 13 01/12/2022 0941   CREATININE 1.12 (H) 01/25/2022 0658   CREATININE 1.07 (H) 01/24/2022 1646   CREATININE 0.98 01/12/2022 0941   CREATININE 0.89 12/15/2021 1048   CREATININE 0.96 11/15/2021 1122   CALCIUM 8.5 (L) 01/25/2022 0658   CALCIUM 7.5  (L) 01/24/2022 1646   CALCIUM 8.5 (L) 01/12/2022 0941   GFRNONAA 46 (L) 01/25/2022 0658   GFRNONAA 48 (L) 01/24/2022 1646   GFRNONAA 53 (L) 01/12/2022 0941   GFRNONAA >60 12/15/2021 1048   GFRNONAA 55 (L) 11/15/2021 1122   GFRAA >60 09/04/2019 1230   GFRAA >60 08/20/2017 1658   CBC    Component Value Date/Time   WBC 10.1 01/25/2022 0658   RBC 2.89 (L) 01/25/2022 0658   HGB 9.1 (L) 01/25/2022 0658   HGB 10.1 (L) 01/12/2022 0941   HCT 29.3 (L) 01/25/2022 0658   PLT 126 (L) 01/25/2022 0658   PLT 128 (L) 01/12/2022 0941   MCV 101.4 (H) 01/25/2022 0658   MCH 31.5 01/25/2022 0658   MCHC 31.1 01/25/2022 0658   RDW 16.4 (H) 01/25/2022 0658   LYMPHSABS 0.5 (L) 01/25/2022 0658   MONOABS 0.3 01/25/2022 0658   EOSABS 0.3 01/25/2022 0658   BASOSABS 0.0 01/25/2022 0658   HEPATIC Function Panel Recent Labs    01/12/22 0941 01/24/22 1644 01/24/22 1646 01/25/22 0658  PROT 6.0*  --  5.3* 6.0*  ALBUMIN 3.4*  --  2.8* 3.4*  AST 23  --  57* 60*  ALT 8  --  16 18  ALKPHOS 44  --  28* 30*  BILIDIR  --  0.5*  --   --  IBILI  --  0.8  --   --    HEMOGLOBIN A1C No results found for: "MPG" CARDIAC ENZYMES No results found for: "CKTOTAL", "CKMB", "CKMBINDEX", "TROPONINI" BNP No results for input(s): "PROBNP" in the last 8760 hours. TSH No results for input(s): "TSH" in the last 8760 hours. CHOLESTEROL No results for input(s): "CHOL" in the last 8760 hours.  Scheduled Meds:  busPIRone  5 mg Oral Daily   citalopram  10 mg Oral q morning   furosemide  20 mg Intravenous BID   furosemide  40 mg Intravenous BID   metoprolol tartrate  25 mg Oral BID   sodium chloride flush  3 mL Intravenous Q12H   Continuous Infusions:  albumin human 25 g (01/24/22 2306)   PRN Meds:.acetaminophen **OR** acetaminophen, ondansetron **OR** ondansetron (ZOFRAN) IV, oxyCODONE  Assessment/Plan: Acute coronary syndrome Acute systolic left heart failure HTN Rheumatoid arthritis Anemia of chronic  disease CKD, II Abdominal pain S/P rectal stromal malignancy  Plan: Patient agrees to medical therapy due to advanced age and anemia. She has limited activity. She was advised to stay with a family member for a month till she is strong enough to stay with prn help at home. Awaiting echocardiogram.   LOS: 1 day   Time spent including chart review, lab review, examination, discussion with patient/Nurse : 30 min   Dixie Dials  MD  01/25/2022, 9:20 AM

## 2022-01-25 NOTE — Progress Notes (Signed)
Prior-To-Admission Oral Chemotherapy for Treatment of Oncologic Disease   Procedure Per Pharmacy & Therapeutics Committee Policy: Orders for continuation of home oral chemotherapy for treatment of an oncologic disease will be held unless approved by an oncologist during current admission.    For patients receiving oncology care at [facility], inpatient pharmacist contacts patient's oncologist during regular office hours to review. If earlier review is medically necessary, attending physician consults [facility] on-call oncologist   For patients receiving oncology care outside of [facility], attending physician consults patient's oncologist to review. If this oncologist or their coverage cannot be reached, attending physician consults [facility] on-call oncologist   Reached out to patient's oncologist, Dr. Benay Spice, this morning. Verbal order from MD to continue to [place] at this time.   Lenis Noon, PharmD 01/25/2022, 8:27 AM

## 2022-01-25 NOTE — Progress Notes (Signed)
[facility]                                                                               Alexandria Weaver, is a 88 y.o. female, DOB - [birth year], EXH:[id number] Admit date - 01/24/2022    Outpatient Primary MD for the patient is Alexandria Margarita, DO  LOS - 1  days    Brief summary   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 She presented to [facility] ER due to N/V, abd pain.   She states that she has had some "shaking" at home.  She vomited on Sunday and has chronic intermittent diarrhea.  She also endorses having felt hot/sweaty.  On workup: Febrile with rectal temp 102 F, SPO2 70-90% on RA.  ABG showed pH 7.41, PCO2 39, PO2 43.  UA negative for UTI.  Initial lactic 2.9.  WBC 3.2, hemoglobin 9.7, platelets 108k.   She underwent work-up with CTA chest and CT abdomen/pelvis with contrast.  Findings were negative for PE.  Showed cardiomegaly with bilateral moderate pleural effusions and probable atelectasis bibasilar.  Enlarged mediastinal lymph nodes.  Small amount of gallbladder sludge, enlarged CBD up to 9 mm.  Left posterior rectal mass measuring 3.7 cm appearing slightly smaller than prior.  Underlying diverticulosis and small volume ascites.  She was initially placed on nonrebreather due to oxygen requirement and was weaned down to 5 L. She was treated with vancomycin and cefepime due to concern for sepsis/cholecystitis. [facility] surgeon felt ERCP may be warranted and patient was therefore recommended for transfer.   Assessment & Plan    Assessment and Plan:  Nausea and abdominal pain: - CT at [facility] concerning for GB sludge with CBD 37m - she was treated with vanc/cefepime due to concern for sepsis/acute cholecystitis and/or possible biliary obstruction - Mildly elevated AST, normal bilirubin, AK phos is 30.  - MRCP ordered and pending.   Acute systolic CHF, and Acute Coronary  syndrome In view of her advanced age , cardiology recommends medical management.  Started the patient on lasix 20 mg BID.  Continue with metoprolol 25 mg bID.   Acute respiratory failure with hypoxia (HCC)  Improving, suspect from acute systolic CHF.   History of DVT (deep vein thrombosis)  On xarelto, which is on  hold for possible acute cholecystitis  -Patient states last dose was on 01/22/2022   Anxiety Continue with celexa and buspar.   Glaucoma   Macular degeneration - Recommend follow up with ophthalmology as outpatient.   RA (rheumatoid arthritis) (HCC) - On weekly methotrexate Will hold off for now.   [place]um [place] - follows with cancer center (diagnosed 2020) - currently on Gleevac, which is on hold for now.    Elevated lactic acid:  From acute systolic CHF. Repeat later today.      Estimated body mass index is 26.81 kg/m as calculated from the following:   Height as of this encounter: '5\' 2"'$  (1.575 m).   Weight as of this encounter: 66.5 kg.  Code Status: DNR DVT Prophylaxis:  SCDs Start: 01/24/22 1630  Level of Care: Level of care: Progressive Family Communication: DAUGHTER AT BEDSIDE  Disposition Plan:     Remains inpatient appropriate: awaiting for MRI of the abdomen.   Procedures:  Echocardiogram.  Consultants:   Cardiology.    Antimicrobials:   Anti-infectives (From admission, onward)    None        Medications  Scheduled Meds:  busPIRone  5 mg Oral Daily   citalopram  10 mg Oral q morning   furosemide  20 mg Intravenous BID   metoprolol tartrate  25 mg Oral BID   sodium chloride flush  3 mL Intravenous Q12H   Continuous Infusions:  albumin human 25 g (01/25/22 1017)   PRN Meds:.acetaminophen **OR** acetaminophen, ondansetron **OR** ondansetron (ZOFRAN) IV, oxyCODONE    Subjective:   Alexandria Weaver was seen and examined today.  No abdominal pain, no nausea, vomiting. No chest pain or sob.   Objective:   Vitals:    01/24/22 1603 01/24/22 2030 01/24/22 2033 01/25/22 0216  BP:  (!) 132/55 (!) 147/57 (!) 130/50  Pulse:  (!) 103 97 96  Resp: '20 16 16 16  '$ Temp:  99.5 F (37.5 C) 99.5 F (37.5 C) 98.7 F (37.1 C)  TempSrc:  Oral  Oral  SpO2:  (!) 87% 96% 99%  Weight: 66.5 kg     Height: '5\' 2"'$  (1.575 m)       Intake/Output Summary (Last 24 hours) at 01/25/2022 1310 Last data filed at 01/25/2022 0600 Gross per 24 hour  Intake 84.61 ml  Output 300 ml  Net -215.39 ml   Filed Weights   01/24/22 1603  Weight: 66.5 kg     Exam General: Alert and oriented x 3, NAD Cardiovascular: S1 S2 auscultated, no murmurs, RRR Respiratory: Clear to auscultation bilaterally, no wheezing, rales or rhonchi Gastrointestinal: Soft, nontender, nondistended, + bowel sounds Ext: no pedal edema bilaterally Neuro: AAOx3, Cr N's II- XII. Strength 5/5 upper and lower extremities bilaterally Skin: No rashes Psych: Normal affect and demeanor, alert and oriented x3    Data Reviewed:  I have personally reviewed following labs and imaging studies   CBC Lab Results  Component Value Date   WBC 10.1 01/25/2022   RBC 2.89 (L) 01/25/2022   HGB 9.1 (L) 01/25/2022   HCT 29.3 (L) 01/25/2022   MCV 101.4 (H) 01/25/2022   MCH 31.5 01/25/2022   PLT 126 (L) 01/25/2022   MCHC 31.1 01/25/2022   RDW 16.4 (H) 01/25/2022   LYMPHSABS 0.5 (L) 01/25/2022   MONOABS 0.3 01/25/2022   EOSABS 0.3 01/25/2022   BASOSABS 0.0 26/83/4196     Last metabolic panel Lab Results  Component Value Date   NA 139 01/25/2022   K 4.0 01/25/2022   CL 107 01/25/2022   CO2 23 01/25/2022   BUN 31 (H) 01/25/2022   CREATININE 1.12 (H) 01/25/2022   GLUCOSE 94 01/25/2022   GFRNONAA 46 (L) 01/25/2022   GFRAA >60 09/04/2019   CALCIUM 8.5 (L) 01/25/2022   PHOS 3.2 01/24/2022   PROT 6.0 (L) 01/25/2022   ALBUMIN 3.4 (L) 01/25/2022   BILITOT 1.2 01/25/2022   ALKPHOS 30 (L) 01/25/2022   AST 60 (H) 01/25/2022   ALT 18 01/25/2022   ANIONGAP 9  01/25/2022    CBG (last 3)  No results for input(s): "GLUCAP" in the last 72 hours.    Coagulation Profile: Recent Labs  Lab 01/24/22 1646  INR 3.1*     Radiology Studies: ECHOCARDIOGRAM COMPLETE  Result Date: 01/25/2022  ECHOCARDIOGRAM REPORT   Patient Name:   London [name] Date of Exam: 01/25/2022 Medical Rec #:  [id number]      Height:       62.0 in Accession #:    [id number]     Weight:       146.6 lb Date of Birth:  31-Dec-1924     BSA:          1.675 m Patient Age:    11 years       BP:           130/50 mmHg Patient Gender: F              HR:           95 bpm. Exam Location:  Inpatient Procedure: 2D Echo, Color Doppler and Cardiac Doppler Indications:     Acute CHF  History:         Patient has prior history of Echocardiogram examinations, most                  recent 06/10/2014.  Sonographer:     Eartha Inch Referring Phys:  [place] Diagnosing Phys: Dixie Dials MD  Sonographer Comments: Technically difficult study due to poor echo windows. Image acquisition challenging due to patient body habitus and Image acquisition challenging due to respiratory motion. IMPRESSIONS  1. Left ventricular ejection fraction, by estimation, is 50 to 55%. The left ventricle has low normal function. The left ventricle has no regional wall motion abnormalities. Left ventricular diastolic parameters are consistent with Grade I diastolic dysfunction (impaired relaxation).  2. Right ventricular systolic function is normal. The right ventricular size is normal.  3. Left atrial size was moderately dilated.  4. Right atrial size was mildly dilated.  5. Posterior leaflet mod decreased of the mitral valve leaflets.  6. The mitral valve is degenerative. Mild mitral valve regurgitation. Mild mitral stenosis.  7. Tricuspid valve regurgitation is moderate.  8. The aortic valve is tricuspid. There is mild calcification of the aortic valve. There is mild thickening of the aortic valve. Aortic valve  regurgitation is not visualized. Aortic valve sclerosis is present, with no evidence of aortic valve stenosis. FINDINGS  Left Ventricle: Left ventricular ejection fraction, by estimation, is 50 to 55%. The left ventricle has low normal function. The left ventricle has no regional wall motion abnormalities. The left ventricular internal cavity size was normal in size. There is no left ventricular hypertrophy. Left ventricular diastolic parameters are consistent with Grade I diastolic dysfunction (impaired relaxation). Right Ventricle: The right ventricular size is normal. No increase in right ventricular wall thickness. Right ventricular systolic function is normal. Left Atrium: Left atrial size was moderately dilated. Right Atrium: Right atrial size was mildly dilated. Pericardium: There is no evidence of pericardial effusion. Mitral Valve: The mitral valve is degenerative in appearance. There is mild thickening of the mitral valve leaflet(s). There is mild calcification of the mitral valve leaflet(s). Posterior leaflet mod decreased of the mitral valve leaflets. Mild mitral annular calcification. Mild mitral valve regurgitation. Mild mitral valve stenosis. Tricuspid Valve: The tricuspid valve is normal in structure. Tricuspid valve regurgitation is moderate. Aortic Valve: The aortic valve is tricuspid. There is mild calcification of the aortic valve. There is mild thickening of the aortic valve. There is mild aortic valve annular calcification. Aortic valve regurgitation is not visualized. Aortic valve sclerosis is present, with no evidence of aortic valve stenosis. Pulmonic Valve: The pulmonic valve was normal in structure. Pulmonic  valve regurgitation is not visualized. Aorta: The aortic root is normal in size and structure. There is minimal (Grade I) atheroma plaque involving the ascending aorta. Venous: The inferior vena cava was not well visualized. IAS/Shunts: The atrial septum is grossly normal.  LEFT  VENTRICLE PLAX 2D LVIDd:         4.40 cm     Diastology LVIDs:         3.50 cm     LV e' medial:    4.33 cm/s LV PW:         0.80 cm     LV E/e' medial:  24.9 LV IVS:        0.80 cm     LV e' lateral:   3.37 cm/s LVOT diam:     1.70 cm     LV E/e' lateral: 31.9 LV SV:         58 LV SV Index:   35 LVOT Area:     2.27 cm  LV Volumes (MOD) LV vol d, MOD A2C: 49.5 ml LV vol d, MOD A4C: 79.1 ml LV vol s, MOD A2C: 25.2 ml LV vol s, MOD A4C: 34.2 ml LV SV MOD A2C:     24.3 ml LV SV MOD A4C:     79.1 ml LV SV MOD BP:      37.0 ml RIGHT VENTRICLE RV S prime:     11.20 cm/s TAPSE (M-mode): 2.0 cm LEFT ATRIUM           Index        RIGHT ATRIUM           Index LA diam:      3.70 cm 2.21 cm/m   RA Area:     14.00 cm LA Vol (A4C): 68.9 ml 41.13 ml/m  RA Volume:   32.10 ml  19.16 ml/m  AORTIC VALVE LVOT Vmax:   135.00 cm/s LVOT Vmean:  95.500 cm/s LVOT VTI:    0.257 m  AORTA Ao Root diam: 2.90 cm Ao Asc diam:  3.20 cm MITRAL VALVE                TRICUSPID VALVE MV Area (PHT): 2.33 cm     TR Peak grad:   30.9 mmHg MV Decel Time: 326 msec     TR Mean grad:   21.0 mmHg MV E velocity: 107.67 cm/s  TR Vmax:        278.00 cm/s MV A velocity: 153.00 cm/s  TR Vmean:       218.0 cm/s MV E/A ratio:  0.70                             SHUNTS                             Systemic VTI:  0.26 m                             Systemic Diam: 1.70 cm Dixie Dials MD Electronically signed by Dixie Dials MD Signature Date/Time: 01/25/2022/12:58:01 PM    Final    DG CHEST PORT 1 VIEW  Result Date: 01/24/2022 CLINICAL DATA:  Acute hypoxemic respiratory failure EXAM: PORTABLE CHEST 1 VIEW COMPARISON:  Yesterday from [facility] [place] FINDINGS: Numerous leads and wires project over the chest. Midline trachea. Mild cardiomegaly. Small right  and trace left pleural effusions, similar given differences in technique. No pneumothorax. Suspect underlying pulmonary venous congestion, accentuated by low lung volumes. Slightly worsened right greater than  left base airspace disease. Upper lumbar vertebral augmentation. IMPRESSION: Slightly worsened aeration with diminished lung volumes and progressive bibasilar airspace disease. Pulmonary venous congestion with similar small bilateral pleural effusions. Electronically Signed   By: Abigail Miyamoto M.D.   On: 01/24/2022 17:40       Hosie Poisson M.D. [facility] 01/25/2022, 1:10 PM  Available via Epic secure chat 7am-7pm After 7 pm, please refer to night coverage provider listed on amion.

## 2022-01-25 NOTE — Progress Notes (Signed)
Patient refused ECHO and was confused for most part of the night. She refused to keep her cardiac monitor on and still refused a new IV to be put in.

## 2022-01-25 NOTE — Progress Notes (Signed)
  Echocardiogram 2D Echocardiogram has been performed.  Alexandria Weaver 01/25/2022, 12:01 PM

## 2022-01-26 ENCOUNTER — Other Ambulatory Visit (HOSPITAL_COMMUNITY): Payer: Self-pay

## 2022-01-26 ENCOUNTER — Telehealth (HOSPITAL_COMMUNITY): Payer: Self-pay | Admitting: Pharmacy Technician

## 2022-01-26 DIAGNOSIS — R531 Weakness: Secondary | ICD-10-CM | POA: Diagnosis not present

## 2022-01-26 DIAGNOSIS — Z86718 Personal history of other venous thrombosis and embolism: Secondary | ICD-10-CM | POA: Diagnosis not present

## 2022-01-26 DIAGNOSIS — Z515 Encounter for palliative care: Secondary | ICD-10-CM

## 2022-01-26 DIAGNOSIS — R609 Edema, unspecified: Secondary | ICD-10-CM | POA: Diagnosis not present

## 2022-01-26 DIAGNOSIS — C49A5 Gastrointestinal stromal tumor of rectum: Secondary | ICD-10-CM

## 2022-01-26 DIAGNOSIS — Z7189 Other specified counseling: Secondary | ICD-10-CM

## 2022-01-26 DIAGNOSIS — R109 Unspecified abdominal pain: Secondary | ICD-10-CM | POA: Diagnosis not present

## 2022-01-26 DIAGNOSIS — J9601 Acute respiratory failure with hypoxia: Secondary | ICD-10-CM | POA: Diagnosis not present

## 2022-01-26 LAB — CBC WITH DIFFERENTIAL/PLATELET
Abs Immature Granulocytes: 0.03 10*3/uL (ref 0.00–0.07)
Basophils Absolute: 0 10*3/uL (ref 0.0–0.1)
Basophils Relative: 0 %
Eosinophils Absolute: 0.6 10*3/uL — ABNORMAL HIGH (ref 0.0–0.5)
Eosinophils Relative: 6 %
HCT: 25.4 % — ABNORMAL LOW (ref 36.0–46.0)
Hemoglobin: 8.1 g/dL — ABNORMAL LOW (ref 12.0–15.0)
Immature Granulocytes: 0 %
Lymphocytes Relative: 10 %
Lymphs Abs: 1 10*3/uL (ref 0.7–4.0)
MCH: 32.1 pg (ref 26.0–34.0)
MCHC: 31.9 g/dL (ref 30.0–36.0)
MCV: 100.8 fL — ABNORMAL HIGH (ref 80.0–100.0)
Monocytes Absolute: 1 10*3/uL (ref 0.1–1.0)
Monocytes Relative: 10 %
Neutro Abs: 7.4 10*3/uL (ref 1.7–7.7)
Neutrophils Relative %: 74 %
Platelets: 126 10*3/uL — ABNORMAL LOW (ref 150–400)
RBC: 2.52 MIL/uL — ABNORMAL LOW (ref 3.87–5.11)
RDW: 16.2 % — ABNORMAL HIGH (ref 11.5–15.5)
WBC: 10 10*3/uL (ref 4.0–10.5)
nRBC: 0 % (ref 0.0–0.2)

## 2022-01-26 LAB — MAGNESIUM: Magnesium: 2.2 mg/dL (ref 1.7–2.4)

## 2022-01-26 LAB — RETICULOCYTES
Immature Retic Fract: 19.2 % — ABNORMAL HIGH (ref 2.3–15.9)
RBC.: 2.76 MIL/uL — ABNORMAL LOW (ref 3.87–5.11)
Retic Count, Absolute: 68.4 10*3/uL (ref 19.0–186.0)
Retic Ct Pct: 2.5 % (ref 0.4–3.1)

## 2022-01-26 LAB — COMPREHENSIVE METABOLIC PANEL
ALT: 15 U/L (ref 0–44)
AST: 36 U/L (ref 15–41)
Albumin: 3.2 g/dL — ABNORMAL LOW (ref 3.5–5.0)
Alkaline Phosphatase: 28 U/L — ABNORMAL LOW (ref 38–126)
Anion gap: 9 (ref 5–15)
BUN: 38 mg/dL — ABNORMAL HIGH (ref 8–23)
CO2: 24 mmol/L (ref 22–32)
Calcium: 8 mg/dL — ABNORMAL LOW (ref 8.9–10.3)
Chloride: 104 mmol/L (ref 98–111)
Creatinine, Ser: 1.39 mg/dL — ABNORMAL HIGH (ref 0.44–1.00)
GFR, Estimated: 35 mL/min — ABNORMAL LOW (ref >60)
Glucose, Bld: 98 mg/dL (ref 70–99)
Potassium: 3.4 mmol/L — ABNORMAL LOW (ref 3.5–5.1)
Sodium: 137 mmol/L (ref 135–145)
Total Bilirubin: 1.1 mg/dL (ref 0.3–1.2)
Total Protein: 5.5 g/dL — ABNORMAL LOW (ref 6.5–8.1)

## 2022-01-26 LAB — FERRITIN: Ferritin: 163 ng/mL (ref 11–307)

## 2022-01-26 LAB — IRON AND TIBC
Iron: 32 ug/dL (ref 28–170)
Saturation Ratios: 21 % (ref 10.4–31.8)
TIBC: 151 ug/dL — ABNORMAL LOW (ref 250–450)
UIBC: 119 ug/dL

## 2022-01-26 LAB — VITAMIN B12: Vitamin B-12: 1383 pg/mL — ABNORMAL HIGH (ref 180–914)

## 2022-01-26 LAB — TSH: TSH: 2.295 u[IU]/mL (ref 0.350–4.500)

## 2022-01-26 LAB — TROPONIN I (HIGH SENSITIVITY): Troponin I (High Sensitivity): 644 ng/L (ref <18)

## 2022-01-26 LAB — FOLATE: Folate: 3.4 ng/mL — ABNORMAL LOW (ref >5.9)

## 2022-01-26 MED ORDER — APIXABAN 2.5 MG PO TABS
2.5000 mg | ORAL_TABLET | Freq: Two times a day (BID) | ORAL | Status: DC
  Administered 2022-01-26 – 2022-01-31 (×10): 2.5 mg via ORAL
  Filled 2022-01-26 (×10): qty 1

## 2022-01-26 MED ORDER — FOLIC ACID 1 MG PO TABS
1.0000 mg | ORAL_TABLET | Freq: Every day | ORAL | Status: DC
  Administered 2022-01-26 – 2022-01-31 (×6): 1 mg via ORAL
  Filled 2022-01-26 (×6): qty 1

## 2022-01-26 MED ORDER — FUROSEMIDE 10 MG/ML IJ SOLN: 20.0000 mg | Freq: Every day | INTRAMUSCULAR | Status: DC

## 2022-01-26 MED ORDER — VANCOMYCIN HCL 125 MG PO CAPS
125.0000 mg | ORAL_CAPSULE | Freq: Four times a day (QID) | ORAL | Status: DC
  Administered 2022-01-26 – 2022-01-31 (×22): 125 mg via ORAL
  Filled 2022-01-26 (×23): qty 1

## 2022-01-26 MED ORDER — POTASSIUM CHLORIDE CRYS ER 20 MEQ PO TBCR
40.0000 meq | EXTENDED_RELEASE_TABLET | Freq: Once | ORAL | Status: AC
  Administered 2022-01-26: 40 meq via ORAL
  Filled 2022-01-26: qty 2

## 2022-01-26 NOTE — Progress Notes (Signed)
IP PROGRESS NOTE  Subjective:   Alexandria Weaver is known to me with a history of gastrointestinal stromal tumor of the rectum.  She has been maintained on Gleevec since July of this year.  She reports tolerating the [place] well.  No rash.  She developed abdominal pain, nausea/vomiting, and fever.  She presented to the [facility] emergency room where a CT revealed an enlarged common bile duct.  The left posterior rectal mass appeared smaller.  She was placed on IV antibiotics due to concern for sepsis.  She was transferred to [facility] for further evaluation. Influenza A, influenza B, RSV, and COVID-19 testing returned negative.  She reports feeling sleepy.  No other complaint. Objective: Vital signs in last 24 hours: Blood pressure (!) 146/68, pulse 78, temperature 98.8 F (37.1 C), temperature source Oral, resp. rate 17, height '5\' 2"'$  (1.575 m), weight 146 lb 9.7 oz (66.5 kg), SpO2 100 %.  Intake/Output from previous day: 10/18 0701 - 10/19 0700 In: 398.6 [P.O.:240; IV Piggyback:158.6] Out: 1700 [Urine:1700]  Physical Exam:  HEENT: Mouth is dry, no thrush Lungs: Clear anteriorly Cardiac: Regular rate and rhythm Abdomen: Mildly distended, nontender Skin: Erythema over the arms, abdomen, and legs  Lab Results: Recent Labs    01/25/22 0658 01/26/22 0400  WBC 10.1 10.0  HGB 9.1* 8.1*  HCT 29.3* 25.4*  PLT 126* 126*    BMET Recent Labs    01/25/22 0658 01/26/22 0400  NA 139 137  K 4.0 3.4*  CL 107 104  CO2 23 24  GLUCOSE 94 98  BUN 31* 38*  CREATININE 1.12* 1.39*  CALCIUM 8.5* 8.0*    No results found for: "CEA1", "CEA", "HEN277", "CA125"  Studies/Results: MR ABDOMEN WO CONTRAST  Result Date: 01/25/2022 CLINICAL DATA:  Possible biliary stones. EXAM: MRI ABDOMEN WITHOUT CONTRAST TECHNIQUE: Multiplanar multisequence MR imaging was performed without the administration of intravenous contrast. COMPARISON:  CT abdomen 01/23/2022 and ultrasound from 01/24/2022  FINDINGS: The patient terminated the exam with only 7 sequences completed, and was not receptive to further MRI imaging. The had refused MR imaging the night before. We obtain the best exam we could under the circumstances. Lower chest: Moderate right and small left pleural effusion with passive atelectasis in the lower lobes. Mild cardiomegaly. Hepatobiliary: Common bile duct caliber currently 0.6 cm, within normal limits for the patient's age. There is normal expected tapering course of the distal CBD without a filling defect identified. No substantial degree of intrahepatic biliary dilatation. Suspected sludge in the gallbladder posteriorly. Suspected tiny gallstones in the gallbladder dependently on image 23 series 5. Nonspecific 4 mm T2 hyperintense lesion in the left hepatic lobe on image 14 series 7, likely cysts but technically nonspecific. Pancreas:  Unremarkable Spleen:  Unremarkable Adrenals/Urinary Tract:  No significant abnormality identified. Stomach/Bowel: Mildly dilated loop of jejunum in the left abdomen measuring 3.1 cm in diameter on image 14 series 7. A specific cause for this mild localized bowel dilatation is not identified. Vascular/Lymphatic: No specific abnormality identified. No upper abdominal adenopathy. Other: Mild to moderate upper abdominal ascites. Substantial subcutaneous edema especially along the flanks. Musculoskeletal: Degenerative endplate findings at O2-4. Lumbar degenerative disc disease. Prior vertebral augmentation at L1. IMPRESSION: 1. The patient terminated the exam with only 7 sequences completed, and was not receptive to further MRI imaging or completion of the exam. We obtain the best exam we could under the circumstances. 2. No substantial degree of intrahepatic or extrahepatic biliary dilatation. There is normal expected tapering course  of the distal CBD without specific findings of choledocholithiasis. 3. Suspected tiny gallstones in the gallbladder dependently. 4.  Moderate right and small left pleural effusion with passive atelectasis in the lower lobes. 5. Mild to moderate upper abdominal ascites. Substantial subcutaneous edema especially along the flanks. 6. Degenerative endplate findings at Z8-5. 7. Mildly dilated loop of jejunum in the left abdomen measuring 3.1 cm in diameter. A specific cause for this mild localized bowel dilatation is not identified. Electronically Signed   By: Van Clines M.D.   On: 01/25/2022 15:30   ECHOCARDIOGRAM COMPLETE  Result Date: 01/25/2022    ECHOCARDIOGRAM REPORT   Patient Name:   Alexandria Weaver Date of Exam: 01/25/2022 Medical Rec #:  [id number]      Height:       62.0 in Accession #:    [id number]     Weight:       146.6 lb Date of Birth:  December 27, 1924     BSA:          1.675 m Patient Age:    88 years       BP:           130/50 mmHg Patient Gender: F              HR:           95 bpm. Exam Location:  Inpatient Procedure: 2D Echo, Color Doppler and Cardiac Doppler Indications:     Acute CHF  History:         Patient has prior history of Echocardiogram examinations, most                  recent 06/10/2014.  Sonographer:     Eartha Inch Referring Phys:  [place] Diagnosing Phys: Dixie Dials MD  Sonographer Comments: Technically difficult study due to poor echo windows. Image acquisition challenging due to patient body habitus and Image acquisition challenging due to respiratory motion. IMPRESSIONS  1. Left ventricular ejection fraction, by estimation, is 50 to 55%. The left ventricle has low normal function. The left ventricle has no regional wall motion abnormalities. Left ventricular diastolic parameters are consistent with Grade I diastolic dysfunction (impaired relaxation).  2. Right ventricular systolic function is normal. The right ventricular size is normal.  3. Left atrial size was moderately dilated.  4. Right atrial size was mildly dilated.  5. Posterior leaflet mod decreased of the mitral valve leaflets.  6.  The mitral valve is degenerative. Mild mitral valve regurgitation. Mild mitral stenosis.  7. Tricuspid valve regurgitation is moderate.  8. The aortic valve is tricuspid. There is mild calcification of the aortic valve. There is mild thickening of the aortic valve. Aortic valve regurgitation is not visualized. Aortic valve sclerosis is present, with no evidence of aortic valve stenosis. FINDINGS  Left Ventricle: Left ventricular ejection fraction, by estimation, is 50 to 55%. The left ventricle has low normal function. The left ventricle has no regional wall motion abnormalities. The left ventricular internal cavity size was normal in size. There is no left ventricular hypertrophy. Left ventricular diastolic parameters are consistent with Grade I diastolic dysfunction (impaired relaxation). Right Ventricle: The right ventricular size is normal. No increase in right ventricular wall thickness. Right ventricular systolic function is normal. Left Atrium: Left atrial size was moderately dilated. Right Atrium: Right atrial size was mildly dilated. Pericardium: There is no evidence of pericardial effusion. Mitral Valve: The mitral valve is degenerative in appearance. There is mild thickening of the  mitral valve leaflet(s). There is mild calcification of the mitral valve leaflet(s). Posterior leaflet mod decreased of the mitral valve leaflets. Mild mitral annular calcification. Mild mitral valve regurgitation. Mild mitral valve stenosis. Tricuspid Valve: The tricuspid valve is normal in structure. Tricuspid valve regurgitation is moderate. Aortic Valve: The aortic valve is tricuspid. There is mild calcification of the aortic valve. There is mild thickening of the aortic valve. There is mild aortic valve annular calcification. Aortic valve regurgitation is not visualized. Aortic valve sclerosis is present, with no evidence of aortic valve stenosis. Pulmonic Valve: The pulmonic valve was normal in structure. Pulmonic valve  regurgitation is not visualized. Aorta: The aortic root is normal in size and structure. There is minimal (Grade I) atheroma plaque involving the ascending aorta. Venous: The inferior vena cava was not well visualized. IAS/Shunts: The atrial septum is grossly normal.  LEFT VENTRICLE PLAX 2D LVIDd:         4.40 cm     Diastology LVIDs:         3.50 cm     LV e' medial:    4.33 cm/s LV PW:         0.80 cm     LV E/e' medial:  24.9 LV IVS:        0.80 cm     LV e' lateral:   3.37 cm/s LVOT diam:     1.70 cm     LV E/e' lateral: 31.9 LV SV:         58 LV SV Index:   35 LVOT Area:     2.27 cm  LV Volumes (MOD) LV vol d, MOD A2C: 49.5 ml LV vol d, MOD A4C: 79.1 ml LV vol s, MOD A2C: 25.2 ml LV vol s, MOD A4C: 34.2 ml LV SV MOD A2C:     24.3 ml LV SV MOD A4C:     79.1 ml LV SV MOD BP:      37.0 ml RIGHT VENTRICLE RV S prime:     11.20 cm/s TAPSE (M-mode): 2.0 cm LEFT ATRIUM           Index        RIGHT ATRIUM           Index LA diam:      3.70 cm 2.21 cm/m   RA Area:     14.00 cm LA Vol (A4C): 68.9 ml 41.13 ml/m  RA Volume:   32.10 ml  19.16 ml/m  AORTIC VALVE LVOT Vmax:   135.00 cm/s LVOT Vmean:  95.500 cm/s LVOT VTI:    0.257 m  AORTA Ao Root diam: 2.90 cm Ao Asc diam:  3.20 cm MITRAL VALVE                TRICUSPID VALVE MV Area (PHT): 2.33 cm     TR Peak grad:   30.9 mmHg MV Decel Time: 326 msec     TR Mean grad:   21.0 mmHg MV E velocity: 107.67 cm/s  TR Vmax:        278.00 cm/s MV A velocity: 153.00 cm/s  TR Vmean:       218.0 cm/s MV E/A ratio:  0.70                             SHUNTS  Systemic VTI:  0.26 m                             Systemic Diam: 1.70 cm Dixie Dials MD Electronically signed by Dixie Dials MD Signature Date/Time: 01/25/2022/12:58:01 PM    Final    DG CHEST PORT 1 VIEW  Result Date: 01/24/2022 CLINICAL DATA:  Acute hypoxemic respiratory failure EXAM: PORTABLE CHEST 1 VIEW COMPARISON:  Yesterday from [facility] [place] FINDINGS: Numerous leads and wires project  over the chest. Midline trachea. Mild cardiomegaly. Small right and trace left pleural effusions, similar given differences in technique. No pneumothorax. Suspect underlying pulmonary venous congestion, accentuated by low lung volumes. Slightly worsened right greater than left base airspace disease. Upper lumbar vertebral augmentation. IMPRESSION: Slightly worsened aeration with diminished lung volumes and progressive bibasilar airspace disease. Pulmonary venous congestion with similar small bilateral pleural effusions. Electronically Signed   By: Abigail Miyamoto M.D.   On: 01/24/2022 17:40    Medications: I have reviewed the patient's current medications.  Assessment/Plan:  Gastrointestinal stromal tumor of the rectum CT abdomen/pelvis 02/13/2021-4.7 cm low-attenuation lesion in the left lateral wall of the inferior rectum and anal canal MRI pelvis 06/14/2021-6 x 5.7 x 5.3 cm anorectal mass with involvement of the left-sided sphincter, no evidence of nodal metastases EUS 08/11/2021-6.5 cm heterogenous subepithelial mass communicating with the muscularis propria layer of the mid rectal wall, biopsy-gastrointestinal stromal tumor, high-grade Gleevec 200 mg daily beginning 10/19/2021 CTs chest, abdomen, and pelvis 01/23/2022 at [facility] for pulmonary embolism, cardiomegaly with moderate bilateral effusions, nonspecific enlarged mediastinal nodes, large, bile duct, left posterior rectal mass appears slightly smaller MRI abdomen 01/25/2022-no intrahepatic rec hepatobiliary dilation, moderate right and small left pleural effusions, mild to moderate upper abdominal ascites History of recurrent deep vein thrombosis-maintained on chronic anticoagulation Rheumatoid arthritis-maintained on weekly methotrexate Glaucoma Macular degeneration Nausea vomiting/diarrhea, and fever 01/23/2022 C. difficile positive 01/25/2022 7.  Anemia secondary to imatinib, methotrexate, and chronic disease    Alexandria Weaver  has a history of a rectal GIST.  She has been maintained on Gleevec since July of this year.  I reviewed CT and MRI images.  The rectal mass appears smaller compared to the pre-Gleevec imaging.  I recommend continuing [place].  She is now admitted with nausea/vomiting, fever, and diarrhea.  A stool sample is positive for C. difficile.  She is being evaluated by cardiology for heart failure and an elevated troponin.  She may have a drug rash from antibiotics administered on admission.  Recommendations: Cancel the planned outpatient restaging CTs Management of C. difficile colitis per the medical service Resume Gleevec at discharge Transfuse packed red blood cells for symptomatic anemia Please call oncology as needed, I will check on her 01/30/2022 if she remains in the hospital and outpatient follow-up will be scheduled at the Cancer center   LOS: 2 days   Betsy Coder, MD   01/26/2022, 7:16 AM

## 2022-01-26 NOTE — Telephone Encounter (Signed)
Pharmacy Patient Advocate Encounter  Insurance verification completed.    The patient is insured through [employer] Part D   The patient is currently admitted and ran test claims for the following: vancomycin.  Copays and coinsurance results were relayed to Inpatient clinical team.

## 2022-01-26 NOTE — Consult Note (Signed)
Ref: Sueanne Margarita, DO   Subjective:  Resting comfortably. Awakens easily. VS stable. Monitor: Normal sinus rhythm. Echocardiogram with preserved LV systolic function and mild diastolic dysfunction. Further drop in hemoglobin and rise in creatinine.  Objective:  Vital Signs in the last 24 hours: Temp:  [98.4 F (36.9 C)-98.8 F (37.1 C)] 98.8 F (37.1 C) (10/19 0437) Pulse Rate:  [78-86] 78 (10/19 0437) Cardiac Rhythm: Normal sinus rhythm (10/19 0700) Resp:  [17] 17 (10/19 0437) BP: (99-146)/(53-88) 146/68 (10/19 0437) SpO2:  [97 %-100 %] 100 % (10/19 0437)  Physical Exam: BP Readings from Last 1 Encounters:  01/26/22 (!) 146/68     Wt Readings from Last 1 Encounters:  01/24/22 66.5 kg    Weight change:  Body mass index is 26.81 kg/m. HEENT: [place]/AT, Eyes-Blue, Conjunctiva-Pale, Sclera-Non-icteric Neck: No JVD, No bruit, Trachea midline. Lungs:  Clear, Bilateral. Cardiac:  Regular rhythm, normal S1 and S2, no S3. II/VI systolic murmur. Abdomen:  Soft, non-tender. BS present. Extremities:  No edema present. No cyanosis. No clubbing. CNS: AxOx3, Cranial nerves grossly intact, moves all 4 extremities.  Skin: Warm and dry.   Intake/Output from previous day: 10/18 0701 - 10/19 0700 In: 398.6 [P.O.:240; IV Piggyback:158.6] Out: 1700 [Urine:1700]    Lab Results: BMET    Component Value Date/Time   NA 137 01/26/2022 0400   NA 139 01/25/2022 0658   NA 136 01/24/2022 1646   K 3.4 (L) 01/26/2022 0400   K 4.0 01/25/2022 0658   K 4.5 01/24/2022 1646   CL 104 01/26/2022 0400   CL 107 01/25/2022 0658   CL 108 01/24/2022 1646   CO2 24 01/26/2022 0400   CO2 23 01/25/2022 0658   CO2 20 (L) 01/24/2022 1646   GLUCOSE 98 01/26/2022 0400   GLUCOSE 94 01/25/2022 0658   GLUCOSE 72 01/24/2022 1646   BUN 38 (H) 01/26/2022 0400   BUN 31 (H) 01/25/2022 0658   BUN 24 (H) 01/24/2022 1646   CREATININE 1.39 (H) 01/26/2022 0400   CREATININE 1.12 (H) 01/25/2022 0658   CREATININE  1.07 (H) 01/24/2022 1646   CREATININE 0.98 01/12/2022 0941   CREATININE 0.89 12/15/2021 1048   CREATININE 0.96 11/15/2021 1122   CALCIUM 8.0 (L) 01/26/2022 0400   CALCIUM 8.5 (L) 01/25/2022 0658   CALCIUM 7.5 (L) 01/24/2022 1646   GFRNONAA 35 (L) 01/26/2022 0400   GFRNONAA 46 (L) 01/25/2022 0658   GFRNONAA 48 (L) 01/24/2022 1646   GFRNONAA 53 (L) 01/12/2022 0941   GFRNONAA >60 12/15/2021 1048   GFRNONAA 55 (L) 11/15/2021 1122   GFRAA >60 09/04/2019 1230   GFRAA >60 08/20/2017 1658   CBC    Component Value Date/Time   WBC 10.0 01/26/2022 0400   RBC 2.52 (L) 01/26/2022 0400   HGB 8.1 (L) 01/26/2022 0400   HGB 10.1 (L) 01/12/2022 0941   HCT 25.4 (L) 01/26/2022 0400   PLT 126 (L) 01/26/2022 0400   PLT 128 (L) 01/12/2022 0941   MCV 100.8 (H) 01/26/2022 0400   MCH 32.1 01/26/2022 0400   MCHC 31.9 01/26/2022 0400   RDW 16.2 (H) 01/26/2022 0400   LYMPHSABS 1.0 01/26/2022 0400   MONOABS 1.0 01/26/2022 0400   EOSABS 0.6 (H) 01/26/2022 0400   BASOSABS 0.0 01/26/2022 0400   HEPATIC Function Panel Recent Labs    01/24/22 1644 01/24/22 1646 01/25/22 0658 01/26/22 0400  PROT  --  5.3* 6.0* 5.5*  ALBUMIN  --  2.8* 3.4* 3.2*  AST  --  57* 60*  36  ALT  --  '16 18 15  '$ ALKPHOS  --  28* 30* 28*  BILIDIR 0.5*  --   --   --   IBILI 0.8  --   --   --    HEMOGLOBIN A1C No results found for: "MPG" CARDIAC ENZYMES No results found for: "CKTOTAL", "CKMB", "CKMBINDEX", "TROPONINI" BNP No results for input(s): "PROBNP" in the last 8760 hours. TSH No results for input(s): "TSH" in the last 8760 hours. CHOLESTEROL No results for input(s): "CHOL" in the last 8760 hours.  Scheduled Meds:  busPIRone  5 mg Oral Daily   citalopram  10 mg Oral q morning   [START ON 01/27/2022] furosemide  20 mg Intravenous Daily   metoprolol tartrate  25 mg Oral BID   sodium chloride flush  3 mL Intravenous Q12H   vancomycin  125 mg Oral QID   Continuous Infusions: PRN Meds:.acetaminophen **OR**  acetaminophen, gadobutrol, ondansetron **OR** ondansetron (ZOFRAN) IV, oxyCODONE  Assessment/Plan: Recent inferior/anterior wall MI Acute diastolic left heart failure HTN Rheumatoid arthritis Anemia of chronic disease CKD, IIIa Abdominal pain S/P rectal stromal malignancy  Plan: Hgb too low to start aspirin and Plavix. Medical treatment only due to advanced age, malignancy and other co morbidities. Agree with palliative care consult.   LOS: 2 days   Time spent including chart review, lab review, examination, discussion with patient/Nurse : 30 min  Dixie Dials  MD  01/26/2022, 10:32 AM

## 2022-01-26 NOTE — TOC Benefit Eligibility Note (Signed)
Patient Teacher, English as a foreign language completed.    The patient is currently admitted and upon discharge could be taking vancomycin 125 mg capsules.  The current 10 day co-pay is $39.01.   The patient is insured through [place], [place] Patient Advocate Specialist [place] Patient Advocate Team Direct Number: [phone]  Fax: [phone]

## 2022-01-26 NOTE — Progress Notes (Addendum)
[facility]                                                                               Alexandria Weaver, is a 88 y.o. female, DOB - December 29, 1924, SAY:[id number] Admit date - 01/24/2022    Outpatient Primary MD for the patient is Alexandria Margarita, DO  LOS - 2  days    Brief summary   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 She presented to [facility] ER due to N/V, abd pain.   She states that she has had some "shaking" at home.  She vomited on Sunday and has chronic intermittent diarrhea.  She also endorses having felt hot/sweaty.  On workup: Febrile with rectal temp 102 F, SPO2 70-90% on RA.  ABG showed pH 7.41, PCO2 39, PO2 43.  UA negative for UTI.  Initial lactic 2.9.  WBC 3.2, hemoglobin 9.7, platelets 108k.   She underwent work-up with CTA chest and CT abdomen/pelvis with contrast.  Findings were negative for PE.  Showed cardiomegaly with bilateral moderate pleural effusions and probable atelectasis bibasilar.  Enlarged mediastinal lymph nodes.  Small amount of gallbladder sludge, enlarged CBD up to 9 mm.  Left posterior rectal mass measuring 3.7 cm appearing slightly smaller than prior.  Underlying diverticulosis and small volume ascites.  She was initially placed on nonrebreather due to oxygen requirement and was weaned down to 5 L. She was treated with vancomycin and cefepime due to concern for sepsis/cholecystitis. [facility] surgeon felt ERCP may be warranted and patient was therefore recommended for transfer.   Assessment & Plan    Assessment and Plan:  Nausea and abdominal pain: - CT at [facility] concerning for GB sludge with CBD 22m - she was treated with vanc/cefepime due to concern for sepsis/acute cholecystitis and/or possible biliary obstruction - Mildly elevated AST, normal bilirubin, AK phos is 30.  - MRCP ordered , it was not an optimal study. No substantial degree of  intrahepatic or extrahepatic biliary dilatation. There is normal expected tapering course of the distal CBD without specific findings of choledocholithiasis.  - she is currently asymptomatic. She denies any abdominal pain and nausea.   Acute systolic CHF, and Acute Coronary syndrome In view of her advanced age , cardiology recommends medical management.  Started the patient on lasix 20 mg BID but cut down the dose to 20 mg daily due to rise in creatinine.  Recheck BMP in am and dose lasix .  Continue with metoprolol 25 mg bID.   Acute respiratory failure with hypoxia (HCC)  Improving, suspect from acute systolic CHF.  Plan to wean her off the oxygen.   History of DVT (deep vein thrombosis)  On xarelto, which is on  hold for possible acute cholecystitis  -Patient states last dose was on 01/22/2022. Due to rise in her creatinine , xarelto cannot be used, will switch to Eliquis 2.5 mg BID.    Anxiety Continue with celexa and buspar.   Glaucoma   Macular degeneration - Recommend follow up with ophthalmology as outpatient.   RA (rheumatoid arthritis) (HCC) - On  weekly methotrexate Will hold off for now.   GIST-rectum ([place]) - follows with cancer center (diagnosed 2020) - currently on Gleevac, which is on hold for now.  - oncology on board.    Elevated lactic acid:  From acute systolic CHF. Recheck lactic acid in am.    Loose stools:  C diff by PCR is positive and C diff antigen is positive.  Started her on oral vancomycin.    Hypokalemia:  Replaced.    Macrocytic anemia:  Check anemia panel and transfuse to keep hemoglobin greater than 7.   Acute on stage 3a CKD;  Baseline creatinine less than 1 with GFR between 55 to 59.  Currently creatinine at 1.39. will recheck creatinine in am and dose lasix .     Disposition: as per the daughter, patient lives alone.  Will get PT/OT EVAL.  In view of her advanced age, and multiple medical problems, will get palliative  care consult for goals of care.     Estimated body mass index is 26.81 kg/m as calculated from the following:   Height as of this encounter: '5\' 2"'$  (1.575 m).   Weight as of this encounter: 66.5 kg.  Code Status: DNR DVT Prophylaxis:  SCDs Start: 01/24/22 1630   Level of Care: Level of care: Progressive Family Communication: DAUGHTER AT BEDSIDE  Disposition Plan:     Remains inpatient appropriate:  still requiring oxygen.   Procedures:  Echocardiogram.  Consultants:   Cardiology.  Palliative care.  Oncology.   Antimicrobials:   Anti-infectives (From admission, onward)    Start     Dose/Rate Route Frequency Ordered Stop   01/26/22 1000  vancomycin (VANCOCIN) capsule 125 mg        125 mg Oral 4 times daily 01/26/22 2876 02/05/22 0959        Medications  Scheduled Meds:  busPIRone  5 mg Oral Daily   citalopram  10 mg Oral q morning   [START ON 01/27/2022] furosemide  20 mg Intravenous Daily   metoprolol tartrate  25 mg Oral BID   sodium chloride flush  3 mL Intravenous Q12H   vancomycin  125 mg Oral QID   Continuous Infusions:   PRN Meds:.acetaminophen **OR** acetaminophen, gadobutrol, ondansetron **OR** ondansetron (ZOFRAN) IV, oxyCODONE    Subjective:   Alexandria Weaver was seen and examined today.  No new complaints today. No nausea, vomiting or abdominal pain.    Objective:   Vitals:   01/25/22 0216 01/25/22 2124 01/25/22 2234 01/26/22 0437  BP: (!) 130/50 99/88 (!) 124/53 (!) 146/68  Pulse: 96 86 86 78  Resp: '16 17  17  '$ Temp: 98.7 F (37.1 C) 98.4 F (36.9 C)  98.8 F (37.1 C)  TempSrc: Oral Oral  Oral  SpO2: 99% 97%  100%  Weight:      Height:        Intake/Output Summary (Last 24 hours) at 01/26/2022 1240 Last data filed at 01/26/2022 0503 Gross per 24 hour  Intake 158.57 ml  Output 1700 ml  Net -1541.43 ml    Filed Weights   01/24/22 1603  Weight: 66.5 kg     Exam General exam: Appears calm and comfortable  Respiratory  system:  diminished air entry at bases. On 2 lit of [place] oxygen.  Cardiovascular system: S1 & S2 heard, RRR. No JVD, . No pedal edema. Gastrointestinal system: Abdomen is nondistended, soft and nontender. Normal bowel sounds heard. Central nervous system: Alert and oriented. No focal neurological deficits. Extremities: Symmetric  5 x 5 power. Skin: No rashes, lesions or ulcers Psychiatry: Judgement and insight appear normal. Mood & affect appropriate.     Data Reviewed:  I have personally reviewed following labs and imaging studies   CBC Lab Results  Component Value Date   WBC 10.0 01/26/2022   RBC 2.76 (L) 01/26/2022   HGB 8.1 (L) 01/26/2022   HCT 25.4 (L) 01/26/2022   MCV 100.8 (H) 01/26/2022   MCH 32.1 01/26/2022   PLT 126 (L) 01/26/2022   MCHC 31.9 01/26/2022   RDW 16.2 (H) 01/26/2022   LYMPHSABS 1.0 01/26/2022   MONOABS 1.0 01/26/2022   EOSABS 0.6 (H) 01/26/2022   BASOSABS 0.0 54/12/8117     Last metabolic panel Lab Results  Component Value Date   NA 137 01/26/2022   K 3.4 (L) 01/26/2022   CL 104 01/26/2022   CO2 24 01/26/2022   BUN 38 (H) 01/26/2022   CREATININE 1.39 (H) 01/26/2022   GLUCOSE 98 01/26/2022   GFRNONAA 35 (L) 01/26/2022   GFRAA >60 09/04/2019   CALCIUM 8.0 (L) 01/26/2022   PHOS 3.2 01/24/2022   PROT 5.5 (L) 01/26/2022   ALBUMIN 3.2 (L) 01/26/2022   BILITOT 1.1 01/26/2022   ALKPHOS 28 (L) 01/26/2022   AST 36 01/26/2022   ALT 15 01/26/2022   ANIONGAP 9 01/26/2022    CBG (last 3)  No results for input(s): "GLUCAP" in the last 72 hours.    Coagulation Profile: Recent Labs  Lab 01/24/22 1646  INR 3.1*      Radiology Studies: MR ABDOMEN WO CONTRAST  Result Date: 01/25/2022 CLINICAL DATA:  Possible biliary stones. EXAM: MRI ABDOMEN WITHOUT CONTRAST TECHNIQUE: Multiplanar multisequence MR imaging was performed without the administration of intravenous contrast. COMPARISON:  CT abdomen 01/23/2022 and ultrasound from 01/24/2022 FINDINGS:  The patient terminated the exam with only 7 sequences completed, and was not receptive to further MRI imaging. The had refused MR imaging the night before. We obtain the best exam we could under the circumstances. Lower chest: Moderate right and small left pleural effusion with passive atelectasis in the lower lobes. Mild cardiomegaly. Hepatobiliary: Common bile duct caliber currently 0.6 cm, within normal limits for the patient's age. There is normal expected tapering course of the distal CBD without a filling defect identified. No substantial degree of intrahepatic biliary dilatation. Suspected sludge in the gallbladder posteriorly. Suspected tiny gallstones in the gallbladder dependently on image 23 series 5. Nonspecific 4 mm T2 hyperintense lesion in the left hepatic lobe on image 14 series 7, likely cysts but technically nonspecific. Pancreas:  Unremarkable Spleen:  Unremarkable Adrenals/Urinary Tract:  No significant abnormality identified. Stomach/Bowel: Mildly dilated loop of jejunum in the left abdomen measuring 3.1 cm in diameter on image 14 series 7. A specific cause for this mild localized bowel dilatation is not identified. Vascular/Lymphatic: No specific abnormality identified. No upper abdominal adenopathy. Other: Mild to moderate upper abdominal ascites. Substantial subcutaneous edema especially along the flanks. Musculoskeletal: Degenerative endplate findings at J4-7. Lumbar degenerative disc disease. Prior vertebral augmentation at L1. IMPRESSION: 1. The patient terminated the exam with only 7 sequences completed, and was not receptive to further MRI imaging or completion of the exam. We obtain the best exam we could under the circumstances. 2. No substantial degree of intrahepatic or extrahepatic biliary dilatation. There is normal expected tapering course of the distal CBD without specific findings of choledocholithiasis. 3. Suspected tiny gallstones in the gallbladder dependently. 4. Moderate  right and small left pleural effusion with  passive atelectasis in the lower lobes. 5. Mild to moderate upper abdominal ascites. Substantial subcutaneous edema especially along the flanks. 6. Degenerative endplate findings at B5-8. 7. Mildly dilated loop of jejunum in the left abdomen measuring 3.1 cm in diameter. A specific cause for this mild localized bowel dilatation is not identified. Electronically Signed   By: Van Clines M.D.   On: 01/25/2022 15:30   ECHOCARDIOGRAM COMPLETE  Result Date: 01/25/2022    ECHOCARDIOGRAM REPORT   Patient Name:   Trynity [name] Date of Exam: 01/25/2022 Medical Rec #:  [id number]      Height:       62.0 in Accession #:    [id number]     Weight:       146.6 lb Date of Birth:  [birth year]     BSA:          1.675 m Patient Age:    46 years       BP:           130/50 mmHg Patient Gender: F              HR:           95 bpm. Exam Location:  Inpatient Procedure: 2D Echo, Color Doppler and Cardiac Doppler Indications:     Acute CHF  History:         Patient has prior history of Echocardiogram examinations, most                  recent 06/10/2014.  Sonographer:     Eartha Inch Referring Phys:  [place] Diagnosing Phys: Dixie Dials MD  Sonographer Comments: Technically difficult study due to poor echo windows. Image acquisition challenging due to patient body habitus and Image acquisition challenging due to respiratory motion. IMPRESSIONS  1. Left ventricular ejection fraction, by estimation, is 50 to 55%. The left ventricle has low normal function. The left ventricle has no regional wall motion abnormalities. Left ventricular diastolic parameters are consistent with Grade I diastolic dysfunction (impaired relaxation).  2. Right ventricular systolic function is normal. The right ventricular size is normal.  3. Left atrial size was moderately dilated.  4. Right atrial size was mildly dilated.  5. Posterior leaflet mod decreased of the mitral valve leaflets.  6. The  mitral valve is degenerative. Mild mitral valve regurgitation. Mild mitral stenosis.  7. Tricuspid valve regurgitation is moderate.  8. The aortic valve is tricuspid. There is mild calcification of the aortic valve. There is mild thickening of the aortic valve. Aortic valve regurgitation is not visualized. Aortic valve sclerosis is present, with no evidence of aortic valve stenosis. FINDINGS  Left Ventricle: Left ventricular ejection fraction, by estimation, is 50 to 55%. The left ventricle has low normal function. The left ventricle has no regional wall motion abnormalities. The left ventricular internal cavity size was normal in size. There is no left ventricular hypertrophy. Left ventricular diastolic parameters are consistent with Grade I diastolic dysfunction (impaired relaxation). Right Ventricle: The right ventricular size is normal. No increase in right ventricular wall thickness. Right ventricular systolic function is normal. Left Atrium: Left atrial size was moderately dilated. Right Atrium: Right atrial size was mildly dilated. Pericardium: There is no evidence of pericardial effusion. Mitral Valve: The mitral valve is degenerative in appearance. There is mild thickening of the mitral valve leaflet(s). There is mild calcification of the mitral valve leaflet(s). Posterior leaflet mod decreased of the mitral valve leaflets. Mild mitral annular calcification. Mild mitral  valve regurgitation. Mild mitral valve stenosis. Tricuspid Valve: The tricuspid valve is normal in structure. Tricuspid valve regurgitation is moderate. Aortic Valve: The aortic valve is tricuspid. There is mild calcification of the aortic valve. There is mild thickening of the aortic valve. There is mild aortic valve annular calcification. Aortic valve regurgitation is not visualized. Aortic valve sclerosis is present, with no evidence of aortic valve stenosis. Pulmonic Valve: The pulmonic valve was normal in structure. Pulmonic valve  regurgitation is not visualized. Aorta: The aortic root is normal in size and structure. There is minimal (Grade I) atheroma plaque involving the ascending aorta. Venous: The inferior vena cava was not well visualized. IAS/Shunts: The atrial septum is grossly normal.  LEFT VENTRICLE PLAX 2D LVIDd:         4.40 cm     Diastology LVIDs:         3.50 cm     LV e' medial:    4.33 cm/s LV PW:         0.80 cm     LV E/e' medial:  24.9 LV IVS:        0.80 cm     LV e' lateral:   3.37 cm/s LVOT diam:     1.70 cm     LV E/e' lateral: 31.9 LV SV:         58 LV SV Index:   35 LVOT Area:     2.27 cm  LV Volumes (MOD) LV vol d, MOD A2C: 49.5 ml LV vol d, MOD A4C: 79.1 ml LV vol s, MOD A2C: 25.2 ml LV vol s, MOD A4C: 34.2 ml LV SV MOD A2C:     24.3 ml LV SV MOD A4C:     79.1 ml LV SV MOD BP:      37.0 ml RIGHT VENTRICLE RV S prime:     11.20 cm/s TAPSE (M-mode): 2.0 cm LEFT ATRIUM           Index        RIGHT ATRIUM           Index LA diam:      3.70 cm 2.21 cm/m   RA Area:     14.00 cm LA Vol (A4C): 68.9 ml 41.13 ml/m  RA Volume:   32.10 ml  19.16 ml/m  AORTIC VALVE LVOT Vmax:   135.00 cm/s LVOT Vmean:  95.500 cm/s LVOT VTI:    0.257 m  AORTA Ao Root diam: 2.90 cm Ao Asc diam:  3.20 cm MITRAL VALVE                TRICUSPID VALVE MV Area (PHT): 2.33 cm     TR Peak grad:   30.9 mmHg MV Decel Time: 326 msec     TR Mean grad:   21.0 mmHg MV E velocity: 107.67 cm/s  TR Vmax:        278.00 cm/s MV A velocity: 153.00 cm/s  TR Vmean:       218.0 cm/s MV E/A ratio:  0.70                             SHUNTS                             Systemic VTI:  0.26 m  Systemic Diam: 1.70 cm Dixie Dials MD Electronically signed by Dixie Dials MD Signature Date/Time: 01/25/2022/12:58:01 PM    Final    DG CHEST PORT 1 VIEW  Result Date: 01/24/2022 CLINICAL DATA:  Acute hypoxemic respiratory failure EXAM: PORTABLE CHEST 1 VIEW COMPARISON:  Yesterday from [facility] FINDINGS: Numerous leads and wires project  over the chest. Midline trachea. Mild cardiomegaly. Small right and trace left pleural effusions, similar given differences in technique. No pneumothorax. Suspect underlying pulmonary venous congestion, accentuated by low lung volumes. Slightly worsened right greater than left base airspace disease. Upper lumbar vertebral augmentation. IMPRESSION: Slightly worsened aeration with diminished lung volumes and progressive bibasilar airspace disease. Pulmonary venous congestion with similar small bilateral pleural effusions. Electronically Signed   By: Abigail Miyamoto M.D.   On: 01/24/2022 17:40       Hosie Poisson M.D. [facility] 01/26/2022, 12:40 PM  Available via Epic secure chat 7am-7pm After 7 pm, please refer to night coverage provider listed on amion.

## 2022-01-26 NOTE — Consult Note (Signed)
Consultation Note Date: 01/26/2022   Patient Name: Alexandria Weaver  DOB: 1925-03-02  MRN: [id number]  Age / Sex: 88 y.o., female  PCP: Sueanne Margarita, DO Referring Physician: Hosie Poisson, MD  Reason for Consultation: Establishing goals of care  HPI/Patient Profile: 88 y.o. female   admitted on 01/24/2022   Clinical Assessment and Goals of Care: 88 year old lady who was living by herself up until very recently, history of GI stromal tumor of the rectum, has been maintained on [place], follows with Dr. Benay Spice from medical oncology, initially presented to [facility] emergency room with nausea vomiting abdominal pain and fever, CT revealed enlarged common bile duct, left posterior rectal mass appeared smaller.  She was placed on IV antibiotics and transferred to [facility] long. And is admitted to hospital medicine service, stool sample positive for C. difficile.  Evaluated by cardiology for heart failure and elevated troponin.  Also seen by Dr. Benay Spice in this hospitalization. Palliative consultation for ongoing goals of care discussions has been requested. Patient is a sweet elderly lady resting in bed, daughter is at bedside, she states that her oral intake is improving, her diarrhea is decreasing. Palliative medicine is specialized medical care for people living with serious illness. It focuses on providing relief from the symptoms and stress of a serious illness. The goal is to improve quality of life for both the patient and the family. Goals of care: Broad aims of medical therapy in relation to the patient's values and preferences. Our aim is to provide medical care aimed at enabling patients to achieve the goals that matter most to them, given the circumstances of their particular medical situation and their constraints.  Brief life review performed.  Patient wishes to be as independent as possible.  She  had a reasonable functional status up until very recently.  Patient hopes to get better and hopes that she will be able to be independent again.  NEXT OF KIN  Daughter present at bedside.   SUMMARY OF RECOMMENDATIONS    Agree with DNR Continue current mode of care Agree with PT evaluation: recommend SNF rehab with palliative versus home health care, home PT with home based palliative.  Thank you for the consult.   Code Status/Advance Care Planning: DNR   Symptom Management:     Palliative Prophylaxis:  Bowel Regimen   Psycho-social/Spiritual:  Desire for further Chaplaincy support:yes Additional Recommendations: Caregiving  Support/Resources  Prognosis:  Unable to determine  Discharge Planning: To Be Determined      Primary Diagnoses: Present on Admission: **None**   I have reviewed the medical record, interviewed the patient and family, and examined the patient. The following aspects are pertinent.  Past Medical History:  Diagnosis Date   Acute deep vein thrombosis (DVT) of distal end of left lower extremity ([place]) 02/07/2005   Anxiety 01/03/2021   Arthritis    RA   Frequent nosebleeds    once a week   Mass in rectum 06/09/2020   Neuropathy 01/06/2009   Social History   Socioeconomic History  Marital status: Widowed    Spouse name: Not on file   Number of children: 2   Years of education: 12   Highest education level: High school graduate  Occupational History   Occupation: Retired  Tobacco Use   Smoking status: Never   Smokeless tobacco: Never  Vaping Use   Vaping Use: Never used  Substance and Sexual Activity   Alcohol use: Never   Drug use: Never   Sexual activity: Not Currently  Other Topics Concern   Not on file  Social History Narrative   Lives alone   Right handed   Caffeine: occ coffee. Hot tea is decaf. Sweet tea 2 glasses a day   Social Determinants of Health   Financial Resource Strain: Low Risk  (01/24/2022)   Overall  Financial Resource Strain (CARDIA)    Difficulty of Paying Living Expenses: Not hard at all  Food Insecurity: No Food Insecurity (01/24/2022)   Hunger Vital Sign    Worried About Running Out of Food in the Last Year: Never true    Ran Out of Food in the Last Year: Never true  Transportation Needs: No Transportation Needs (01/24/2022)   PRAPARE - Hydrologist (Medical): No    Lack of Transportation (Non-Medical): No  Physical Activity: Not on file  Stress: Stress Concern Present (01/24/2022)   [place]    Feeling of Stress : To some extent  Social Connections: Socially Isolated (01/24/2022)   Social Connection and Isolation Panel [NHANES]    Frequency of Communication with Friends and Family: More than three times a week    Frequency of Social Gatherings with Friends and Family: More than three times a week    Attends Religious Services: Never    Marine scientist or Organizations: No    Attends Archivist Meetings: Never    Marital Status: Widowed   Family History  Problem Relation Age of Onset   Pneumonia Mother    Stroke Father    Factor V Leiden deficiency Daughter    Lymphoma Daughter    Asthma Son    Allergic rhinitis Son    Angioedema Neg Hx    Eczema Neg Hx    Urticaria Neg Hx    Immunodeficiency Neg Hx    Colon cancer Neg Hx    Stomach cancer Neg Hx    Esophageal cancer Neg Hx    Colon polyps Neg Hx    Scheduled Meds:  busPIRone  5 mg Oral Daily   citalopram  10 mg Oral q morning   [START ON 01/27/2022] furosemide  20 mg Intravenous Daily   metoprolol tartrate  25 mg Oral BID   sodium chloride flush  3 mL Intravenous Q12H   vancomycin  125 mg Oral QID   Continuous Infusions: PRN Meds:.acetaminophen **OR** acetaminophen, gadobutrol, ondansetron **OR** ondansetron (ZOFRAN) IV, oxyCODONE Medications Prior to Admission:  Prior to Admission medications    Medication Sig Start Date End Date Taking? Authorizing Provider  acetaminophen (TYLENOL) 500 MG tablet Take 1,000 mg by mouth at bedtime.   Yes [provider]  busPIRone (BUSPAR) 5 MG tablet Take 5 mg by mouth in the morning. 04/14/21  Yes [provider]  cholecalciferol (VITAMIN D3) 25 MCG (1000 UNIT) tablet Take 1,000 Units by mouth daily.   Yes [provider]  citalopram (CELEXA) 20 MG tablet Take 10 mg by mouth in the morning.   Yes [provider]  cyanocobalamin (VITAMIN B12) 1000 MCG/ML injection Inject 1,000 mcg into the muscle every 30 (thirty) days. 11/17/21  Yes [provider]  denosumab (PROLIA) 60 MG/ML SOSY injection Inject 60 mg into the skin every 6 (six) months.   Yes [provider]  folic acid (FOLVITE) 1 MG tablet Take 1 mg by mouth See admin instructions. Take 1 mg by mouth in the morning and at bedtime on Sun/Mon/Wed/Thurs/Fri/Sat- NOT on Tuesday   Yes [provider]  imatinib (GLEEVEC) 100 MG tablet Take 2 tablets (200 mg total) by mouth daily. Take with meals and large glass of water.Caution:Chemotherapy Patient taking differently: Take 100 mg by mouth See admin instructions. Take 100 mg by mouth in the morning with breakfast and in the evening with supper, with a large glass of water.Caution:Chemotherapy 01/13/22  Yes Ladell Pier, MD  loratadine (CLARITIN) 10 MG tablet Take 10 mg by mouth daily.   Yes [provider]  methotrexate 2.5 MG tablet Take 12.5 mg by mouth See admin instructions. Take 12.5 mg by mouth in the morning and at bedtime on Tuesday ONLY 08/24/19  Yes [provider]  metoprolol succinate (TOPROL XL) 25 MG 24 hr tablet Take 1 tablet (25 mg total) by mouth daily. Patient taking differently: Take 12.5 mg by mouth in the morning. 05/05/21  Yes Sater, Nanine Means, MD  rivaroxaban (XARELTO) 10 MG TABS tablet Take 10 mg by mouth daily with supper. 08/03/15  Yes [provider]  citalopram (CELEXA) 10 MG tablet Take 10 mg by mouth every morning. Patient not taking: Reported on 01/24/2022    [provider]  triamcinolone cream (KENALOG) 0.1 % Apply to affected areas twice dailyi for up to 1 week to 2wks External Patient not taking: Reported on 12/15/2021    [provider]   Allergies  Allergen Reactions   Doxycycline Nausea Only   Leflunomide Diarrhea   Tape Other (See Comments)    Irritates the skin- SKIN TEARS AND BRUISES VERY EASILY!!   Penicillins Rash   Review of Systems Less diarrhea today  Physical Exam Awake alert No distress  A little hard of hearing Abdomen mildly distended No edema  Vital Signs: BP (!) 144/60 (BP Location: Right Arm)   Pulse 66   Temp 97.8 F (36.6 C) (Oral)   Resp 17   Ht '5\' 2"'$  (1.575 m)   Wt 66.5 kg   SpO2 100%   BMI 26.81 kg/m  Pain Scale: 0-10   Pain Score: 0-No pain   SpO2: SpO2: 100 % O2 Device:SpO2: 100 % O2 Flow Rate: .O2 Flow Rate (L/min): 6 L/min  IO: Intake/output summary:  Intake/Output Summary (Last 24 hours) at 01/26/2022 1503 Last data filed at 01/26/2022 0503 Gross per 24 hour  Intake 158.57 ml  Output 1700 ml  Net -1541.43 ml    LBM: Last BM Date : 01/25/22 Baseline Weight: Weight: 66.5 kg Most recent weight: Weight: 66.5 kg     Palliative Assessment/Data:   PPS 50%  Time In:  1400 Time Out:  1500 Time Total:  60  Greater than 50%  of this time was spent counseling and coordinating care related to the above assessment and plan.  Signed by: Loistine Chance, MD   Please contact Palliative Medicine Team phone at [phone] for questions and concerns.  For individual provider: See Shea Evans

## 2022-01-26 NOTE — Progress Notes (Signed)
Mobility Specialist - Progress Note   01/26/22 1535  Mobility  Activity Stood at bedside  Level of Assistance +2 (takes two people)  Geographical information systems officer  Activity Response Tolerated well  Mobility Referral No  $Mobility charge 1 Mobility   Pt was found in bed with RN and NT in room. Pt stood at bedside and was +2 max-A to get out of bed. She could only stand for short periods of time and we did x3 sit>stand. At EOS returned to bed with NT and RN in room.  Ferd Hibbs Mobility Specialist

## 2022-01-27 ENCOUNTER — Telehealth (HOSPITAL_COMMUNITY): Payer: Self-pay

## 2022-01-27 ENCOUNTER — Other Ambulatory Visit (HOSPITAL_COMMUNITY): Payer: Self-pay

## 2022-01-27 DIAGNOSIS — J9601 Acute respiratory failure with hypoxia: Secondary | ICD-10-CM | POA: Diagnosis not present

## 2022-01-27 DIAGNOSIS — R109 Unspecified abdominal pain: Secondary | ICD-10-CM | POA: Diagnosis not present

## 2022-01-27 DIAGNOSIS — R609 Edema, unspecified: Secondary | ICD-10-CM | POA: Diagnosis not present

## 2022-01-27 DIAGNOSIS — Z86718 Personal history of other venous thrombosis and embolism: Secondary | ICD-10-CM | POA: Diagnosis not present

## 2022-01-27 LAB — COMPREHENSIVE METABOLIC PANEL
ALT: 16 U/L (ref 0–44)
AST: 32 U/L (ref 15–41)
Albumin: 3 g/dL — ABNORMAL LOW (ref 3.5–5.0)
Alkaline Phosphatase: 32 U/L — ABNORMAL LOW (ref 38–126)
Anion gap: 5 (ref 5–15)
BUN: 40 mg/dL — ABNORMAL HIGH (ref 8–23)
CO2: 28 mmol/L (ref 22–32)
Calcium: 8.1 mg/dL — ABNORMAL LOW (ref 8.9–10.3)
Chloride: 104 mmol/L (ref 98–111)
Creatinine, Ser: 1.14 mg/dL — ABNORMAL HIGH (ref 0.44–1.00)
GFR, Estimated: 45 mL/min — ABNORMAL LOW (ref >60)
Glucose, Bld: 106 mg/dL — ABNORMAL HIGH (ref 70–99)
Potassium: 3.9 mmol/L (ref 3.5–5.1)
Sodium: 137 mmol/L (ref 135–145)
Total Bilirubin: 1.3 mg/dL — ABNORMAL HIGH (ref 0.3–1.2)
Total Protein: 5.3 g/dL — ABNORMAL LOW (ref 6.5–8.1)

## 2022-01-27 LAB — CBC WITH DIFFERENTIAL/PLATELET
Abs Immature Granulocytes: 0.04 10*3/uL (ref 0.00–0.07)
Basophils Absolute: 0 10*3/uL (ref 0.0–0.1)
Basophils Relative: 0 %
Eosinophils Absolute: 0.7 10*3/uL — ABNORMAL HIGH (ref 0.0–0.5)
Eosinophils Relative: 8 %
HCT: 28.1 % — ABNORMAL LOW (ref 36.0–46.0)
Hemoglobin: 8.9 g/dL — ABNORMAL LOW (ref 12.0–15.0)
Immature Granulocytes: 0 %
Lymphocytes Relative: 10 %
Lymphs Abs: 0.9 10*3/uL (ref 0.7–4.0)
MCH: 31.4 pg (ref 26.0–34.0)
MCHC: 31.7 g/dL (ref 30.0–36.0)
MCV: 99.3 fL (ref 80.0–100.0)
Monocytes Absolute: 2.1 10*3/uL — ABNORMAL HIGH (ref 0.1–1.0)
Monocytes Relative: 23 %
Neutro Abs: 5.3 10*3/uL (ref 1.7–7.7)
Neutrophils Relative %: 59 %
Platelets: 132 10*3/uL — ABNORMAL LOW (ref 150–400)
RBC: 2.83 MIL/uL — ABNORMAL LOW (ref 3.87–5.11)
RDW: 16.2 % — ABNORMAL HIGH (ref 11.5–15.5)
WBC: 9 10*3/uL (ref 4.0–10.5)
nRBC: 0 % (ref 0.0–0.2)

## 2022-01-27 LAB — LACTIC ACID, PLASMA: Lactic Acid, Venous: 1.8 mmol/L (ref 0.5–1.9)

## 2022-01-27 LAB — MAGNESIUM: Magnesium: 1.9 mg/dL (ref 1.7–2.4)

## 2022-01-27 MED ORDER — FUROSEMIDE 10 MG/ML IJ SOLN
20.0000 mg | Freq: Every day | INTRAMUSCULAR | Status: DC
  Administered 2022-01-27 – 2022-01-29 (×3): 20 mg via INTRAVENOUS
  Filled 2022-01-27 (×3): qty 2

## 2022-01-27 NOTE — Progress Notes (Signed)
Mobility Specialist - Progress Note   01/27/22 1248  Mobility  Activity Transferred from chair to bed  Level of Assistance +2 (takes two people)  Games developer wheel walker  Mobility Referral No  $Mobility charge 1 Mobility   RN requested assistance in transfer and was left with RN in room.  Ferd Hibbs Mobility Specialist

## 2022-01-27 NOTE — Progress Notes (Signed)
[facility]                                                                               Special Alexandria Weaver, is a 88 y.o. female, DOB - 04-12-1924, FTD:[id number] Admit date - 01/24/2022    Outpatient Primary MD for the patient is Alexandria Margarita, DO  LOS - 3  days    Brief summary   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 She presented to [facility] ER due to N/V, abd pain.   She states that she has had some "shaking" at home.  She vomited on Sunday and has chronic intermittent diarrhea.  She also endorses having felt hot/sweaty.  On workup: Febrile with rectal temp 102 F, SPO2 70-90% on RA.  ABG showed pH 7.41, PCO2 39, PO2 43.  UA negative for UTI.  Initial lactic 2.9.  WBC 3.2, hemoglobin 9.7, platelets 108k.   She underwent work-up with CTA chest and CT abdomen/pelvis with contrast.  Findings were negative for PE.  Showed cardiomegaly with bilateral moderate pleural effusions and probable atelectasis bibasilar.  Enlarged mediastinal lymph nodes.  Small amount of gallbladder sludge, enlarged CBD up to 9 mm.  Left posterior rectal mass measuring 3.7 cm appearing slightly smaller than prior.  Underlying diverticulosis and small volume ascites.  She was initially placed on nonrebreather due to oxygen requirement and was weaned down to 5 L. She was treated with vancomycin and cefepime due to concern for sepsis/cholecystitis. [facility] surgeon felt ERCP may be warranted and patient was therefore recommended for transfer.   Assessment & Plan    Assessment and Plan:  Nausea and abdominal pain: - CT at [facility] concerning for GB sludge with CBD 45m - she was treated with vanc/cefepime due to concern for sepsis/acute cholecystitis and/or possible biliary obstruction - Mildly elevated AST, normal bilirubin, AK phos is 30.  - MRCP ordered , it was not an optimal study. No substantial degree of  intrahepatic or extrahepatic biliary dilatation. There is normal expected tapering course of the distal CBD without specific findings of choledocholithiasis.  - she is currently asymptomatic. She denies any abdominal pain and nausea.   Acute systolic CHF, and Acute Coronary syndrome In view of her advanced age , cardiology recommends medical management.  Started the patient on lasix 20 mg BID but cut down the dose to 20 mg daily due to rise in creatinine. Creatinine better this morning, continue with IV lasix daily.  Continue with metoprolol 25 mg bID.   Acute respiratory failure with hypoxia (HCC)  Improving, suspect from acute systolic CHF.  Plan to wean her off the oxygen by discharge.   History of DVT (deep vein thrombosis)  On eliquis 2.5 gm BID.   Anxiety Continue with celexa and buspar.   Glaucoma   Macular degeneration - Recommend follow up with ophthalmology as outpatient.   RA (rheumatoid arthritis) ([place] - On weekly methotrexate Hold off till infection clears.   [place]um [place] - follows with cancer center (diagnosed 2020) - currently on Gleevac, which is on hold for now.  - oncology  on board.  - will resume Gleevac on discharge.   Elevated lactic acid:  From acute systolic CHF. Recheck lactic acid in am.    Loose stools:  C diff by PCR is positive and C diff antigen is positive.  Started her on oral vancomycin to complete the course.    Hypokalemia:  Replaced.    Macrocytic anemia:  Check anemia panel and transfuse to keep hemoglobin greater than 7.  Folate replaced.  Vit b12 elevated.  Iron levels adequate.   Acute on stage 3a CKD;  Baseline creatinine less than 1 with GFR between 55 to 59.  Currently creatinine at 1.39. will recheck creatinine in am and dose lasix .     Disposition: as per the daughter, patient lives alone.  Therapy eval recommending SNF. Currently waiting for SNF.  In view of her advanced age, and multiple medical  problems, will get palliative care consult for goals of care.     Estimated body mass index is 26.81 kg/m as calculated from the following:   Height as of this encounter: '5\' 2"'$  (1.575 m).   Weight as of this encounter: 66.5 kg.  Code Status: DNR DVT Prophylaxis:  apixaban (ELIQUIS) tablet 2.5 mg Start: 01/26/22 2200 SCDs Start: 01/24/22 1630 apixaban (ELIQUIS) tablet 2.5 mg   Level of Care: Level of care: Progressive Family Communication: none at bedside.   Disposition Plan:     Remains inpatient appropriate:  still requiring oxygen.   Procedures:  Echocardiogram.  Consultants:   Cardiology.  Palliative care.  Oncology.   Antimicrobials:   Anti-infectives (From admission, onward)    Start     Dose/Rate Route Frequency Ordered Stop   01/26/22 1000  vancomycin (VANCOCIN) capsule 125 mg        125 mg Oral 4 times daily 01/26/22 6812 02/05/22 0959        Medications  Scheduled Meds:  apixaban  2.5 mg Oral BID   busPIRone  5 mg Oral Daily   citalopram  10 mg Oral q morning   folic acid  1 mg Oral Daily   furosemide  20 mg Intravenous Daily   metoprolol tartrate  25 mg Oral BID   sodium chloride flush  3 mL Intravenous Q12H   vancomycin  125 mg Oral QID   Continuous Infusions:   PRN Meds:.acetaminophen **OR** acetaminophen, gadobutrol, ondansetron **OR** ondansetron (ZOFRAN) IV, oxyCODONE    Subjective:   Alexandria Weaver was seen and examined today.  No new complaints. No diarrhea.    Objective:   Vitals:   01/26/22 1328 01/26/22 2141 01/27/22 0502 01/27/22 1349  BP: (!) 144/60 (!) 147/55 (!) 165/72 (!) 137/56  Pulse: 66 70 72 65  Resp:  20  18  Temp: 97.8 F (36.6 C) (!) 97.5 F (36.4 C) 97.8 F (36.6 C) 98.2 F (36.8 C)  TempSrc: Oral Oral Oral Oral  SpO2: 100% 100% 100% 100%  Weight:      Height:        Intake/Output Summary (Last 24 hours) at 01/27/2022 1427 Last data filed at 01/27/2022 0243 Gross per 24 hour  Intake 173 ml  Output 550  ml  Net -377 ml    Filed Weights   01/24/22 1603  Weight: 66.5 kg     Exam General exam: Appears calm and comfortable  Respiratory system: Clear to auscultation. Respiratory effort normal. Cardiovascular system: S1 & S2 heard, RRR.  Murmer present. No pedal edema. Gastrointestinal system: Abdomen is nondistended, soft and nontender.  Normal bowel sounds heard. Central nervous system: Alert and oriented. No focal neurological deficits. Extremities: Symmetric 5 x 5 power. Skin: No rashes, lesions or ulcers Psychiatry: mood is appropriate      Data Reviewed:  I have personally reviewed following labs and imaging studies   CBC Lab Results  Component Value Date   WBC 9.0 01/27/2022   RBC 2.83 (L) 01/27/2022   HGB 8.9 (L) 01/27/2022   HCT 28.1 (L) 01/27/2022   MCV 99.3 01/27/2022   MCH 31.4 01/27/2022   PLT 132 (L) 01/27/2022   MCHC 31.7 01/27/2022   RDW 16.2 (H) 01/27/2022   LYMPHSABS 0.9 01/27/2022   MONOABS 2.1 (H) 01/27/2022   EOSABS 0.7 (H) 01/27/2022   BASOSABS 0.0 46/96/2952     Last metabolic panel Lab Results  Component Value Date   NA 137 01/27/2022   K 3.9 01/27/2022   CL 104 01/27/2022   CO2 28 01/27/2022   BUN 40 (H) 01/27/2022   CREATININE 1.14 (H) 01/27/2022   GLUCOSE 106 (H) 01/27/2022   GFRNONAA 45 (L) 01/27/2022   GFRAA >60 09/04/2019   CALCIUM 8.1 (L) 01/27/2022   PHOS 3.2 01/24/2022   PROT 5.3 (L) 01/27/2022   ALBUMIN 3.0 (L) 01/27/2022   BILITOT 1.3 (H) 01/27/2022   ALKPHOS 32 (L) 01/27/2022   AST 32 01/27/2022   ALT 16 01/27/2022   ANIONGAP 5 01/27/2022    CBG (last 3)  No results for input(s): "GLUCAP" in the last 72 hours.    Coagulation Profile: Recent Labs  Lab 01/24/22 1646  INR 3.1*      Radiology Studies: MR ABDOMEN WO CONTRAST  Result Date: 01/25/2022 CLINICAL DATA:  Possible biliary stones. EXAM: MRI ABDOMEN WITHOUT CONTRAST TECHNIQUE: Multiplanar multisequence MR imaging was performed without the  administration of intravenous contrast. COMPARISON:  CT abdomen 01/23/2022 and ultrasound from 01/24/2022 FINDINGS: The patient terminated the exam with only 7 sequences completed, and was not receptive to further MRI imaging. The had refused MR imaging the night before. We obtain the best exam we could under the circumstances. Lower chest: Moderate right and small left pleural effusion with passive atelectasis in the lower lobes. Mild cardiomegaly. Hepatobiliary: Common bile duct caliber currently 0.6 cm, within normal limits for the patient's age. There is normal expected tapering course of the distal CBD without a filling defect identified. No substantial degree of intrahepatic biliary dilatation. Suspected sludge in the gallbladder posteriorly. Suspected tiny gallstones in the gallbladder dependently on image 23 series 5. Nonspecific 4 mm T2 hyperintense lesion in the left hepatic lobe on image 14 series 7, likely cysts but technically nonspecific. Pancreas:  Unremarkable Spleen:  Unremarkable Adrenals/Urinary Tract:  No significant abnormality identified. Stomach/Bowel: Mildly dilated loop of jejunum in the left abdomen measuring 3.1 cm in diameter on image 14 series 7. A specific cause for this mild localized bowel dilatation is not identified. Vascular/Lymphatic: No specific abnormality identified. No upper abdominal adenopathy. Other: Mild to moderate upper abdominal ascites. Substantial subcutaneous edema especially along the flanks. Musculoskeletal: Degenerative endplate findings at W4-1. Lumbar degenerative disc disease. Prior vertebral augmentation at L1. IMPRESSION: 1. The patient terminated the exam with only 7 sequences completed, and was not receptive to further MRI imaging or completion of the exam. We obtain the best exam we could under the circumstances. 2. No substantial degree of intrahepatic or extrahepatic biliary dilatation. There is normal expected tapering course of the distal CBD without  specific findings of choledocholithiasis. 3. Suspected tiny gallstones in  the gallbladder dependently. 4. Moderate right and small left pleural effusion with passive atelectasis in the lower lobes. 5. Mild to moderate upper abdominal ascites. Substantial subcutaneous edema especially along the flanks. 6. Degenerative endplate findings at Z5-8. 7. Mildly dilated loop of jejunum in the left abdomen measuring 3.1 cm in diameter. A specific cause for this mild localized bowel dilatation is not identified. Electronically Signed   By: Van Clines M.D.   On: 01/25/2022 15:30       Hosie Poisson M.D. [facility] 01/27/2022, 2:27 PM  Available via Epic secure chat 7am-7pm After 7 pm, please refer to night coverage provider listed on amion.

## 2022-01-27 NOTE — Evaluation (Signed)
Occupational Therapy Evaluation Patient Details Name: Alexandria Weaver MRN: [id number] DOB: 02-Mar-1925 Today's Date: 01/27/2022   History of Present Illness 88 year old lady who was living by herself up until very recently, history of GI stromal tumor of the rectum, has been maintained on [place], initially presented to [facility] emergency room with nausea vomiting abdominal pain and fever, CT revealed enlarged common bile duct, left posterior rectal mass appeared smaller.  She was placed on IV antibiotics and transferred to [facility] long.   Clinical Impression   The patient is currently presenting significantly below her baseline level of functioning for self-care management, as she is limited by deconditioning, generalized weakness, reports of malaise and generalized pain, impaired ADL performance, impaired functional mobility, and compromised activity tolerance. She required mod assist for rolling in bed & total assist for simulated lower body dressing. She declined to attempt out of bed transfers and activity today, given malaise and fatigue. She will benefit from further OT services to facilitate improved ADL performance and to decrease the risk for progressive functional decline.      Recommendations for follow up therapy are one component of a multi-disciplinary discharge planning process, led by the attending physician.  Recommendations may be updated based on patient status, additional functional criteria and insurance authorization.   Follow Up Recommendations  Skilled nursing-short term rehab (<3 hours/day)    Assistance Recommended at Discharge Frequent or constant Supervision/Assistance  Patient can return home with the following Assist for transportation;Assistance with cooking/housework;Direct supervision/assist for medications management;A lot of help with walking and/or transfers;A lot of help with bathing/dressing/bathroom    Functional Status Assessment  Patient has had a  recent decline in their functional status and demonstrates the ability to make significant improvements in function in a reasonable and predictable amount of time.  Equipment Recommendations  Other (comment) (to be determined pending progress at next setting)       Precautions / Restrictions Precautions Precautions: Fall Precaution Comments: enteric precautions Restrictions Weight Bearing Restrictions: No      Mobility Bed Mobility Overal bed mobility: Needs Assistance Bed Mobility: Rolling Rolling: Mod assist         General bed mobility comments: She further required max assist for scooting to the [facility]    Transfers      General transfer comment: Unable to assess out of bed transfers, as the pt declined to attempt      Balance       Sitting balance - Comments: unable to assess       Standing balance comment: unable to assess           ADL either performed or assessed with clinical judgement   ADL Overall ADL's : Needs assistance/impaired Eating/Feeding: Set up;Bed level Eating/Feeding Details (indicate cue type and reason): based on clinical judgement Grooming: Set up;Bed level Grooming Details (indicate cue type and reason): based on clinical judgement         Upper Body Dressing : Minimal assistance Upper Body Dressing Details (indicate cue type and reason): simulated Lower Body Dressing: Total assistance;Bed level                Vision   Additional Comments: She correctly read the time depicted on the wall clock.            Pertinent Vitals/Pain Pain Assessment Pain Location: She reported having generalized pain, however she did not quantify her pain. Per her daughter, she was given Tylenol ~1 hour prior to the session.  Hand Dominance Right   Extremity/Trunk Assessment Upper Extremity Assessment Upper Extremity Assessment: (B UE AROM WFL. B UE strength grossly 4-/5)   Lower Extremity Assessment Lower Extremity Assessment:  Generalized weakness   Cervical / Trunk Assessment Cervical / Trunk Assessment: Kyphotic   Communication Communication Communication: No difficulties   Cognition Arousal/Alertness: Awake/alert Behavior During Therapy: WFL for tasks assessed/performed Overall Cognitive Status: Within Functional Limits for tasks assessed          General Comments: Oriented & able to follow 1-2 step commands consistently.                Home Living Family/patient expects to be discharged to:: Skilled nursing facility Living Arrangements: Alone          Additional Comments: lived alone PTA      Prior Functioning/Environment               Mobility Comments: walked with rollator ADLs Comments: She was modified independent to independent with toileting and dressing, and she had a home health aide 2 days per week for a couple hours each day who assisted her with cleaning and showers.  She receives meals on wheels.        OT Problem List: Decreased strength;Decreased activity tolerance;Impaired balance (sitting and/or standing);Pain      OT Treatment/Interventions: Self-care/ADL training;Therapeutic exercise;Therapeutic activities;DME and/or AE instruction;Patient/family education;Balance training;Energy conservation    OT Goals(Current goals can be found in the care plan section) Acute Rehab OT Goals Patient Stated Goal: the pt did not specifically state OT Goal Formulation: With patient Time For Goal Achievement: 02/10/22 Potential to Achieve Goals: Fair  OT Frequency: Min 2X/week       AM-PAC OT "6 Clicks" Daily Activity     Outcome Measure Help from another person eating meals?: None Help from another person taking care of personal grooming?: A Little Help from another person toileting, which includes using toliet, bedpan, or urinal?: Total Help from another person bathing (including washing, rinsing, drying)?: A Lot Help from another person to put on and taking off regular  upper body clothing?: A Little Help from another person to put on and taking off regular lower body clothing?: Total 6 Click Score: 14   End of Session Equipment Utilized During Treatment: Oxygen Nurse Communication: Mobility status  Activity Tolerance: Patient limited by fatigue Patient left: in bed;with call bell/phone within reach;with bed alarm set;with family/visitor present  OT Visit Diagnosis: Muscle weakness (generalized) (M62.81);Pain                Time: 1350-1409 OT Time Calculation (min): 19 min Charges:  OT General Charges $OT Visit: 1 Visit OT Evaluation $OT Eval Moderate Complexity: 1 Mod    Clary Meeker L Leeta Grimme, OTR/L 01/27/2022, 3:01 PM

## 2022-01-27 NOTE — Evaluation (Signed)
Physical Therapy Evaluation Patient Details Name: Alexandria Weaver MRN: [id number] DOB: 09-17-1924 Today's Date: 01/27/2022  History of Present Illness  88 year old lady who was living by herself up until very recently, history of GI stromal tumor of the rectum, has been maintained on [place], initially presented to [facility] emergency room with nausea vomiting abdominal pain and fever, CT revealed enlarged common bile duct, left posterior rectal mass appeared smaller.  She was placed on IV antibiotics and transferred to [facility] long.  And is admitted to hospital medicine service, stool sample positive for C. difficile.  Clinical Impression  Pt admitted with above diagnosis. +2 mod assist for rolling, Mod assist for sidelying to sit. +2 max assist sit to stand, pt had difficulty initiating steps in standing with RW so performed "bear hug" squat pivot transfer to recliner. Daughter present and agreeable to ST-SNF. Pt currently with functional limitations due to the deficits listed below (see PT Problem List). Pt will benefit from skilled PT to increase their independence and safety with mobility to allow discharge to the venue listed below.          Recommendations for follow up therapy are one component of a multi-disciplinary discharge planning process, led by the attending physician.  Recommendations may be updated based on patient status, additional functional criteria and insurance authorization.  Follow Up Recommendations Skilled nursing-short term rehab (<3 hours/day) Can patient physically be transported by private vehicle: No    Assistance Recommended at Discharge Frequent or constant Supervision/Assistance  Patient can return home with the following  Two people to help with walking and/or transfers;A lot of help with bathing/dressing/bathroom;Assistance with cooking/housework;Assist for transportation;Help with stairs or ramp for entrance    Equipment Recommendations None  recommended by PT  Recommendations for Other Services       Functional Status Assessment Patient has had a recent decline in their functional status and demonstrates the ability to make significant improvements in function in a reasonable and predictable amount of time.     Precautions / Restrictions Precautions Precautions: Fall Restrictions Weight Bearing Restrictions: No      Mobility  Bed Mobility Overal bed mobility: Needs Assistance Bed Mobility: Rolling, Sidelying to Sit Rolling: Mod assist, +2 for physical assistance Sidelying to sit: Mod assist       General bed mobility comments: assist to initiate roll, assist to raise trunk    Transfers Overall transfer level: Needs assistance Equipment used: Rolling walker (2 wheels) Transfers: Sit to/from Stand, Bed to chair/wheelchair/BSC Sit to Stand: +2 physical assistance, Max assist, From elevated surface     Squat pivot transfers: Max assist     General transfer comment: sit to stand x 2 trials; +2 max assist to power up, pt able to take a few steps with RW towards recliner but then fatigued so sat back on bed and performed "bear hug" squat pivot to recliner    Ambulation/Gait                  Stairs            Wheelchair Mobility    Modified Rankin (Stroke Patients Only)       Balance Overall balance assessment: Needs assistance Sitting-balance support: Feet supported, Single extremity supported Sitting balance-Leahy Scale: Poor Sitting balance - Comments: posterior lean without UE support Postural control: Posterior lean Standing balance support: Bilateral upper extremity supported Standing balance-Leahy Scale: Poor  Pertinent Vitals/Pain Pain Assessment Pain Assessment: Faces Faces Pain Scale: Hurts even more Pain Location: "all over" with mobility Pain Descriptors / Indicators: Grimacing, Moaning Pain Intervention(s): Limited activity  within patient's tolerance, Monitored during session, Repositioned    Home Living Family/patient expects to be discharged to:: Skilled nursing facility                   Additional Comments: lived alone PTA    Prior Function               Mobility Comments: walked with rollator       Hand Dominance        Extremity/Trunk Assessment   Upper Extremity Assessment Upper Extremity Assessment: Defer to OT evaluation    Lower Extremity Assessment Lower Extremity Assessment: Generalized weakness    Cervical / Trunk Assessment Cervical / Trunk Assessment: Kyphotic  Communication   Communication: No difficulties  Cognition Arousal/Alertness: Awake/alert Behavior During Therapy: WFL for tasks assessed/performed Overall Cognitive Status: Within Functional Limits for tasks assessed                                          General Comments      Exercises     Assessment/Plan    PT Assessment Patient needs continued PT services  PT Problem List Decreased mobility;Decreased activity tolerance;Decreased balance       PT Treatment Interventions Gait training;Functional mobility training;Patient/family education;Therapeutic activities;DME instruction    PT Goals (Current goals can be found in the Care Plan section)  Acute Rehab PT Goals Patient Stated Goal: to get back to walking, agreeable to ST-SNF PT Goal Formulation: With patient/family Time For Goal Achievement: 02/10/22 Potential to Achieve Goals: Good    Frequency Min 2X/week     Co-evaluation               AM-PAC PT "6 Clicks" Mobility  Outcome Measure Help needed turning from your back to your side while in a flat bed without using bedrails?: A Lot Help needed moving from lying on your back to sitting on the side of a flat bed without using bedrails?: Total Help needed moving to and from a bed to a chair (including a wheelchair)?: Total Help needed standing up from a chair  using your arms (e.g., wheelchair or bedside chair)?: Total Help needed to walk in hospital room?: Total Help needed climbing 3-5 steps with a railing? : Total 6 Click Score: 7    End of Session Equipment Utilized During Treatment: Gait belt Activity Tolerance: Patient limited by fatigue Patient left: in chair;with chair alarm set;with call bell/phone within reach;with family/visitor present;with nursing/sitter in room Nurse Communication: Mobility status PT Visit Diagnosis: Difficulty in walking, not elsewhere classified (R26.2);Muscle weakness (generalized) (M62.81)    Time: 6222-9798 PT Time Calculation (min) (ACUTE ONLY): 28 min   Charges:   PT Evaluation $PT Eval Moderate Complexity: 1 Mod PT Treatments $Therapeutic Activity: 8-22 mins        Blondell Reveal Kistler PT 01/27/2022  Acute Rehabilitation Services  Office [phone]

## 2022-01-27 NOTE — TOC Benefit Eligibility Note (Signed)
Patient Teacher, English as a foreign language completed.    The patient is currently admitted and upon discharge could be taking Eliquis 2.5.  The current 30 day co-pay is $149.97 due to coverage gap.   The patient is insured through [employer], [place] Patient Advocate Specialist [place] Patient Advocate Team Direct Number: [phone] Fax: [phone]

## 2022-01-27 NOTE — Telephone Encounter (Signed)
Pharmacy Patient Advocate Encounter  Insurance verification completed.    The patient is insured through [employer]   The patient is currently admitted and ran test claims for the following: Eliquis.  Copays and coinsurance results were relayed to Inpatient clinical team.   

## 2022-01-28 DIAGNOSIS — R109 Unspecified abdominal pain: Secondary | ICD-10-CM | POA: Diagnosis not present

## 2022-01-28 DIAGNOSIS — J9601 Acute respiratory failure with hypoxia: Secondary | ICD-10-CM | POA: Diagnosis not present

## 2022-01-28 DIAGNOSIS — R609 Edema, unspecified: Secondary | ICD-10-CM | POA: Diagnosis not present

## 2022-01-28 DIAGNOSIS — Z86718 Personal history of other venous thrombosis and embolism: Secondary | ICD-10-CM | POA: Diagnosis not present

## 2022-01-28 DIAGNOSIS — C49A5 Gastrointestinal stromal tumor of rectum: Secondary | ICD-10-CM | POA: Diagnosis not present

## 2022-01-28 DIAGNOSIS — Z515 Encounter for palliative care: Secondary | ICD-10-CM | POA: Diagnosis not present

## 2022-01-28 LAB — COMPREHENSIVE METABOLIC PANEL
ALT: 19 U/L (ref 0–44)
AST: 43 U/L — ABNORMAL HIGH (ref 15–41)
Albumin: 2.8 g/dL — ABNORMAL LOW (ref 3.5–5.0)
Alkaline Phosphatase: 43 U/L (ref 38–126)
Anion gap: 7 (ref 5–15)
BUN: 37 mg/dL — ABNORMAL HIGH (ref 8–23)
CO2: 29 mmol/L (ref 22–32)
Calcium: 7.9 mg/dL — ABNORMAL LOW (ref 8.9–10.3)
Chloride: 100 mmol/L (ref 98–111)
Creatinine, Ser: 0.93 mg/dL (ref 0.44–1.00)
GFR, Estimated: 57 mL/min — ABNORMAL LOW (ref >60)
Glucose, Bld: 90 mg/dL (ref 70–99)
Potassium: 3.5 mmol/L (ref 3.5–5.1)
Sodium: 136 mmol/L (ref 135–145)
Total Bilirubin: 1.4 mg/dL — ABNORMAL HIGH (ref 0.3–1.2)
Total Protein: 5.3 g/dL — ABNORMAL LOW (ref 6.5–8.1)

## 2022-01-28 LAB — CBC WITH DIFFERENTIAL/PLATELET
Abs Immature Granulocytes: 0.09 10*3/uL — ABNORMAL HIGH (ref 0.00–0.07)
Basophils Absolute: 0.1 10*3/uL (ref 0.0–0.1)
Basophils Relative: 1 %
Eosinophils Absolute: 0.9 10*3/uL — ABNORMAL HIGH (ref 0.0–0.5)
Eosinophils Relative: 10 %
HCT: 30.5 % — ABNORMAL LOW (ref 36.0–46.0)
Hemoglobin: 9.5 g/dL — ABNORMAL LOW (ref 12.0–15.0)
Immature Granulocytes: 1 %
Lymphocytes Relative: 19 %
Lymphs Abs: 1.7 10*3/uL (ref 0.7–4.0)
MCH: 30.4 pg (ref 26.0–34.0)
MCHC: 31.1 g/dL (ref 30.0–36.0)
MCV: 97.8 fL (ref 80.0–100.0)
Monocytes Absolute: 2.2 10*3/uL — ABNORMAL HIGH (ref 0.1–1.0)
Monocytes Relative: 24 %
Neutro Abs: 4.1 10*3/uL (ref 1.7–7.7)
Neutrophils Relative %: 45 %
Platelets: 166 10*3/uL (ref 150–400)
RBC: 3.12 MIL/uL — ABNORMAL LOW (ref 3.87–5.11)
RDW: 16.1 % — ABNORMAL HIGH (ref 11.5–15.5)
WBC: 9 10*3/uL (ref 4.0–10.5)
nRBC: 0 % (ref 0.0–0.2)

## 2022-01-28 LAB — MAGNESIUM: Magnesium: 1.8 mg/dL (ref 1.7–2.4)

## 2022-01-28 NOTE — Progress Notes (Signed)
Daily Progress Note   Patient Name: Alexandria Weaver       Date: 01/28/2022 DOB: [birth year]  Age: 88 y.o. MRN#: [id number] Attending Physician: Hosie Poisson, MD Primary Care Physician: Sueanne Margarita, DO Admit Date: 01/24/2022  Reason for Consultation/Follow-up: Establishing goals of care  Subjective: Noted to be awake alert resting in bed  Length of Stay: 4  Current Medications: Scheduled Meds:   apixaban  2.5 mg Oral BID   busPIRone  5 mg Oral Daily   citalopram  10 mg Oral q morning   folic acid  1 mg Oral Daily   furosemide  20 mg Intravenous Daily   metoprolol tartrate  25 mg Oral BID   sodium chloride flush  3 mL Intravenous Q12H   vancomycin  125 mg Oral QID    Continuous Infusions:   PRN Meds: acetaminophen **OR** acetaminophen, gadobutrol, ondansetron **OR** ondansetron (ZOFRAN) IV, oxyCODONE  Physical Exam         Noted to be awake alert resting in bed Regular work of breathing Does not appear to be in acute distress  Vital Signs: BP (!) 167/68 (BP Location: Right Arm)   Pulse 79   Temp 98.4 F (36.9 C) (Oral)   Resp 18   Ht '5\' 2"'$  (1.575 m)   Wt 66.5 kg   SpO2 100%   BMI 26.81 kg/m  SpO2: SpO2: 100 % O2 Device: O2 Device: Nasal Cannula O2 Flow Rate: O2 Flow Rate (L/min): 1 L/min  Intake/output summary:  Intake/Output Summary (Last 24 hours) at 01/28/2022 1101 Last data filed at 01/28/2022 0600 Gross per 24 hour  Intake 320 ml  Output 950 ml  Net -630 ml   LBM: Last BM Date : 01/28/22 Baseline Weight: Weight: 66.5 kg Most recent weight: Weight: 66.5 kg       Palliative Assessment/Data:      Patient Active Problem List   Diagnosis Date Noted   Abdominal pain 01/24/2022   GIST-rectum ([place]) 01/24/2022   History of DVT (deep vein  thrombosis) 01/24/2022   RA (rheumatoid arthritis) ([place]) 01/24/2022   Macular degeneration 01/24/2022   Glaucoma 01/24/2022   Edema 01/24/2022   Acute respiratory failure with hypoxia (H[facility]) 01/24/2022   Anxiety 01/24/2022   Tremor 01/24/2022   Benign essential tremor 05/05/2021   Gait disturbance 05/05/2021   Syncope  05/05/2021   Closed compression fracture of body of L1 vertebra ([place]) 09/05/2019    Palliative Care Assessment & Plan   Patient Profile:   Assessment:  88 year old lady who was living by herself up until very recently, history of GI stromal tumor of the rectum, has been maintained on Gleevec, follows with Dr. Benay Spice from medical oncology, initially presented to [facility] emergency room with nausea vomiting abdominal pain and fever, CT revealed enlarged common bile duct, left posterior rectal mass appeared smaller.  She was placed on IV antibiotics and transferred to [facility] long. And is admitted to hospital medicine service, stool sample positive for C. difficile.  Evaluated by cardiology for heart failure and elevated troponin.  Also seen by Dr. Benay Spice in this hospitalization. Palliative consultation for ongoing goals of care discussions has been requested.  Recommendations/Plan:  PT/OT recommends SNF rehab attempt, recommend palliative care, will request TOC input, agree with DNR, no new PMT specific interventions or recommendations at this time. Recommend home based palliative care or even palliative support at [facility] if patient doesn't want SNF rehab towards the end of this hospitalization.     Code Status:    Code Status Orders  (From admission, onward)           Start     Ordered   01/24/22 1905  Do not attempt resuscitation (DNR)  Continuous       Question Answer Comment  In the event of cardiac or respiratory ARREST Do not call a "code blue"   In the event of cardiac or respiratory ARREST Do not perform Intubation, CPR, defibrillation or ACLS   In  the event of cardiac or respiratory ARREST Use medication by any route, position, wound care, and other measures to relive pain and suffering. May use oxygen, suction and manual treatment of airway obstruction as needed for comfort.   Comments Pt clearly stated to RN, this is her wishes. Daughter, Amedeo Kinsman, here at admission time, & both confirm same      01/24/22 1906           Code Status History     Date Active Date Inactive Code Status Order ID Comments User Context   01/24/2022 1629 01/24/2022 1906 Full Code [id number]  Dwyane Dee, MD Inpatient       Prognosis:  Unable to determine  Discharge Planning: [place] for rehab with Palliative care service follow-up  Care plan was discussed with  IDT  Thank you for allowing the Palliative Medicine Team to assist in the care of this patient.        Greater than 50%  of this time was spent counseling and coordinating care related to the above assessment and plan. Low MDM Loistine Chance, MD  Please contact Palliative Medicine Team phone at [phone] for questions and concerns.

## 2022-01-28 NOTE — Progress Notes (Signed)
[facility]                                                                               Alexandria Weaver, is a 88 y.o. female, DOB - [birth year], OZD:[id number] Admit date - 01/24/2022    Outpatient Primary MD for the patient is Alexandria Margarita, DO  LOS - 4  days    Brief summary   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 She presented to [facility] ER due to N/V, abd pain.   She states that she has had some "shaking" at home.  She vomited on Sunday and has chronic intermittent diarrhea.  She also endorses having felt hot/sweaty.  On workup: Febrile with rectal temp 102 F, SPO2 70-90% on RA.  ABG showed pH 7.41, PCO2 39, PO2 43.  UA negative for UTI.  Initial lactic 2.9.  WBC 3.2, hemoglobin 9.7, platelets 108k.   She underwent work-up with CTA chest and CT abdomen/pelvis with contrast.  Findings were negative for PE.  Showed cardiomegaly with bilateral moderate pleural effusions and probable atelectasis bibasilar.  Enlarged mediastinal lymph nodes.  Small amount of gallbladder sludge, enlarged CBD up to 9 mm.  Left posterior rectal mass measuring 3.7 cm appearing slightly smaller than prior.  Underlying diverticulosis and small volume ascites.  She was initially placed on nonrebreather due to oxygen requirement and was weaned down to 5 L. She was treated with vancomycin and cefepime due to concern for sepsis/cholecystitis. [facility] surgeon felt ERCP may be warranted and patient was therefore recommended for transfer.  Pt seen and examined today, she is weaned off oxygen, diuresing appropriately. Creatinine is back to baseline,.   Assessment & Plan    Assessment and Plan:  Nausea and abdominal pain: - CT at [facility] concerning for GB sludge with CBD 20m - she was treated with vanc/cefepime due to concern for sepsis/acute cholecystitis and/or possible biliary obstruction - Mildly elevated AST,  normal bilirubin, AK phos is 30.  - MRCP ordered , it was not an optimal study. No substantial degree of intrahepatic or extrahepatic biliary dilatation. There is normal expected tapering course of the distal CBD without specific findings of choledocholithiasis.  - she is currently asymptomatic. She denies any abdominal pain and nausea.   Acute systolic CHF, and Acute Coronary syndrome In view of her advanced age , cardiology recommends medical management.  Started the patient on lasix 20 mg BID but cut down the dose to 20 mg daily due to rise in creatinine. Creatinine continues to improved with IV lasix, transition to oral lasix in am.  Continue with metoprolol 25 mg bID.   Acute respiratory failure with hypoxia (HCC)  Improving, suspect from acute systolic CHF.  She is off oxygen this morning.   History of DVT (deep vein thrombosis)  On eliquis 2.5 gm BID.   Anxiety Continue with celexa and buspar.   Glaucoma   Macular degeneration - Recommend follow up with ophthalmology as outpatient.   RA (rheumatoid arthritis) ([place] - On weekly methotrexate Hold off till infection clears.   [place]um [place] -  follows with cancer center (diagnosed 2020) - currently on Gleevac, which is on hold for now.  - oncology on board.  - will resume Gleevac on discharge.   Elevated lactic acid:  From acute systolic CHF.  Resolved.    Loose stools:  C diff by PCR is positive and C diff antigen is positive.  Started her on oral vancomycin to complete the course.    Hypokalemia:  Replaced.    Macrocytic anemia:  Check anemia panel and transfuse to keep hemoglobin greater than 7.  Folate replaced.  Vit b12 elevated.  Iron levels adequate.   Acute on stage 3a CKD;  Baseline creatinine less than 1 with GFR between 55 to 59.  Currently creatinine at 1.39. will recheck creatinine in am and dose lasix .     Disposition: as per the daughter, patient lives alone.  Therapy eval  recommending SNF. Currently waiting for SNF.  In view of her advanced age, and multiple medical problems, will get palliative care consult for goals of care.  Recommend palliative services to follow on discharge.     Estimated body mass index is 26.81 kg/m as calculated from the following:   Height as of this encounter: '5\' 2"'$  (1.575 m).   Weight as of this encounter: 66.5 kg.  Code Status: DNR DVT Prophylaxis:  apixaban (ELIQUIS) tablet 2.5 mg Start: 01/26/22 2200 SCDs Start: 01/24/22 1630 apixaban (ELIQUIS) tablet 2.5 mg   Level of Care: Level of care: Progressive Family Communication: none at bedside.   Disposition Plan:     Remains inpatient appropriate:  SNF placement.   Procedures:  Echocardiogram.  Consultants:   Cardiology.  Palliative care.  Oncology.   Antimicrobials:   Anti-infectives (From admission, onward)    Start     Dose/Rate Route Frequency Ordered Stop   01/26/22 1000  vancomycin (VANCOCIN) capsule 125 mg        125 mg Oral 4 times daily 01/26/22 2130 02/05/22 0959        Medications  Scheduled Meds:  apixaban  2.5 mg Oral BID   busPIRone  5 mg Oral Daily   citalopram  10 mg Oral q morning   folic acid  1 mg Oral Daily   furosemide  20 mg Intravenous Daily   metoprolol tartrate  25 mg Oral BID   sodium chloride flush  3 mL Intravenous Q12H   vancomycin  125 mg Oral QID   Continuous Infusions:   PRN Meds:.acetaminophen **OR** acetaminophen, gadobutrol, ondansetron **OR** ondansetron (ZOFRAN) IV, oxyCODONE    Subjective:   Alexandria Weaver was seen and examined today.  No new complaints today.    Objective:   Vitals:   01/27/22 1349 01/27/22 1948 01/27/22 2100 01/28/22 0431  BP: (!) 137/56 (!) 139/59  (!) 167/68  Pulse: 65 63  79  Resp: '18 16  18  '$ Temp: 98.2 F (36.8 C) 98.4 F (36.9 C)  98.4 F (36.9 C)  TempSrc: Oral Oral  Oral  SpO2: 100% 100% 100% 100%  Weight:      Height:        Intake/Output Summary (Last 24 hours)  at 01/28/2022 1113 Last data filed at 01/28/2022 0600 Gross per 24 hour  Intake 320 ml  Output 950 ml  Net -630 ml    Filed Weights   01/24/22 1603  Weight: 66.5 kg     Exam General exam: Appears calm and comfortable  Respiratory system: Clear to auscultation. Respiratory effort normal. Cardiovascular system: S1 & S2  heard, RRR. No JVD,  No pedal edema. Gastrointestinal system: Abdomen is nondistended, soft and nontender.  Normal bowel sounds heard. Central nervous system: Alert and oriented. No focal neurological deficits. Extremities: Symmetric 5 x 5 power. Skin: No rashes, lesions or ulcers Psychiatry: Mood & affect appropriate.       Data Reviewed:  I have personally reviewed following labs and imaging studies   CBC Lab Results  Component Value Date   WBC 9.0 01/28/2022   RBC 3.12 (L) 01/28/2022   HGB 9.5 (L) 01/28/2022   HCT 30.5 (L) 01/28/2022   MCV 97.8 01/28/2022   MCH 30.4 01/28/2022   PLT 166 01/28/2022   MCHC 31.1 01/28/2022   RDW 16.1 (H) 01/28/2022   LYMPHSABS 1.7 01/28/2022   MONOABS 2.2 (H) 01/28/2022   EOSABS 0.9 (H) 01/28/2022   BASOSABS 0.1 51/76/1607     Last metabolic panel Lab Results  Component Value Date   NA 136 01/28/2022   K 3.5 01/28/2022   CL 100 01/28/2022   CO2 29 01/28/2022   BUN 37 (H) 01/28/2022   CREATININE 0.93 01/28/2022   GLUCOSE 90 01/28/2022   GFRNONAA 57 (L) 01/28/2022   GFRAA >60 09/04/2019   CALCIUM 7.9 (L) 01/28/2022   PHOS 3.2 01/24/2022   PROT 5.3 (L) 01/28/2022   ALBUMIN 2.8 (L) 01/28/2022   BILITOT 1.4 (H) 01/28/2022   ALKPHOS 43 01/28/2022   AST 43 (H) 01/28/2022   ALT 19 01/28/2022   ANIONGAP 7 01/28/2022    CBG (last 3)  No results for input(s): "GLUCAP" in the last 72 hours.    Coagulation Profile: Recent Labs  Lab 01/24/22 1646  INR 3.1*      Radiology Studies: No results found.     Hosie Poisson M.D. [facility] 01/28/2022, 11:13 AM  Available via Epic secure chat  7am-7pm After 7 pm, please refer to night coverage provider listed on amion.

## 2022-01-29 DIAGNOSIS — J9601 Acute respiratory failure with hypoxia: Secondary | ICD-10-CM | POA: Diagnosis not present

## 2022-01-29 DIAGNOSIS — Z515 Encounter for palliative care: Secondary | ICD-10-CM | POA: Diagnosis not present

## 2022-01-29 DIAGNOSIS — C49A5 Gastrointestinal stromal tumor of rectum: Secondary | ICD-10-CM | POA: Diagnosis not present

## 2022-01-29 LAB — CBC WITH DIFFERENTIAL/PLATELET
Abs Immature Granulocytes: 0.22 10*3/uL — ABNORMAL HIGH (ref 0.00–0.07)
Basophils Absolute: 0.1 10*3/uL (ref 0.0–0.1)
Basophils Relative: 1 %
Eosinophils Absolute: 0.6 10*3/uL — ABNORMAL HIGH (ref 0.0–0.5)
Eosinophils Relative: 5 %
HCT: 31 % — ABNORMAL LOW (ref 36.0–46.0)
Hemoglobin: 10 g/dL — ABNORMAL LOW (ref 12.0–15.0)
Immature Granulocytes: 2 %
Lymphocytes Relative: 26 %
Lymphs Abs: 3.2 10*3/uL (ref 0.7–4.0)
MCH: 30.7 pg (ref 26.0–34.0)
MCHC: 32.3 g/dL (ref 30.0–36.0)
MCV: 95.1 fL (ref 80.0–100.0)
Monocytes Absolute: 2.2 10*3/uL — ABNORMAL HIGH (ref 0.1–1.0)
Monocytes Relative: 18 %
Neutro Abs: 6.3 10*3/uL (ref 1.7–7.7)
Neutrophils Relative %: 48 %
Platelets: 206 10*3/uL (ref 150–400)
RBC: 3.26 MIL/uL — ABNORMAL LOW (ref 3.87–5.11)
RDW: 15.8 % — ABNORMAL HIGH (ref 11.5–15.5)
WBC: 12.6 10*3/uL — ABNORMAL HIGH (ref 4.0–10.5)
nRBC: 0 % (ref 0.0–0.2)

## 2022-01-29 LAB — COMPREHENSIVE METABOLIC PANEL
ALT: 22 U/L (ref 0–44)
AST: 56 U/L — ABNORMAL HIGH (ref 15–41)
Albumin: 2.6 g/dL — ABNORMAL LOW (ref 3.5–5.0)
Alkaline Phosphatase: 47 U/L (ref 38–126)
Anion gap: 7 (ref 5–15)
BUN: 30 mg/dL — ABNORMAL HIGH (ref 8–23)
CO2: 28 mmol/L (ref 22–32)
Calcium: 7.5 mg/dL — ABNORMAL LOW (ref 8.9–10.3)
Chloride: 98 mmol/L (ref 98–111)
Creatinine, Ser: 0.84 mg/dL (ref 0.44–1.00)
GFR, Estimated: >60 mL/min (ref >60)
Glucose, Bld: 107 mg/dL — ABNORMAL HIGH (ref 70–99)
Potassium: 3.2 mmol/L — ABNORMAL LOW (ref 3.5–5.1)
Sodium: 133 mmol/L — ABNORMAL LOW (ref 135–145)
Total Bilirubin: 1.3 mg/dL — ABNORMAL HIGH (ref 0.3–1.2)
Total Protein: 5.5 g/dL — ABNORMAL LOW (ref 6.5–8.1)

## 2022-01-29 LAB — MAGNESIUM: Magnesium: 1.7 mg/dL (ref 1.7–2.4)

## 2022-01-29 MED ORDER — FUROSEMIDE 20 MG PO TABS
20.0000 mg | ORAL_TABLET | Freq: Every day | ORAL | Status: DC
  Administered 2022-01-30 – 2022-01-31 (×2): 20 mg via ORAL
  Filled 2022-01-29 (×2): qty 1

## 2022-01-29 MED ORDER — POTASSIUM CHLORIDE CRYS ER 20 MEQ PO TBCR
40.0000 meq | EXTENDED_RELEASE_TABLET | Freq: Once | ORAL | Status: AC
  Administered 2022-01-29: 40 meq via ORAL
  Filled 2022-01-29: qty 2

## 2022-01-29 MED ORDER — MAGNESIUM SULFATE 2 GM/50ML IV SOLN
2.0000 g | Freq: Once | INTRAVENOUS | Status: AC
  Administered 2022-01-29: 2 g via INTRAVENOUS
  Filled 2022-01-29: qty 50

## 2022-01-29 NOTE — NC FL2 (Signed)
[place] LEVEL OF CARE SCREENING TOOL     IDENTIFICATION  Patient Name: Alexandria Weaver Birthdate: Feb 06, 1925 Sex: female Admission Date (Current Location): 01/24/2022  [facility] and [place] Number:  Herbalist and Address:  [facility],  [place], [place]      Provider Number: [id number]  Attending Physician Name and Address:  Hosie Poisson, MD  Relative Name and Phone Number:  Herod,Glenna Daughter [phone]  [phone]  Herod,Les Relative [phone]  [phone]    Current Level of Care: Hospital Recommended Level of Care: [place] Prior Approval Number:    Date Approved/Denied:   PASRR Number: [id number]  Discharge Plan: SNF    Current Diagnoses: Patient Active Problem List   Diagnosis Date Noted   Abdominal pain 01/24/2022   GIST-rectum ([place]) 01/24/2022   History of DVT (deep vein thrombosis) 01/24/2022   RA (rheumatoid arthritis) ([place]) 01/24/2022   Macular degeneration 01/24/2022   Glaucoma 01/24/2022   Edema 01/24/2022   Acute respiratory failure with hypoxia ([place]) 01/24/2022   Anxiety 01/24/2022   Tremor 01/24/2022   Benign essential tremor 05/05/2021   Gait disturbance 05/05/2021   Syncope 05/05/2021   Closed compression fracture of body of L1 vertebra ([place]) 09/05/2019    Orientation RESPIRATION BLADDER Height & Weight     Self  Normal Incontinent Weight: 146 lb 9.7 oz (66.5 kg) Height:  '5\' 2"'$  (157.5 cm)  BEHAVIORAL SYMPTOMS/MOOD NEUROLOGICAL BOWEL NUTRITION STATUS      Incontinent Diet  AMBULATORY STATUS COMMUNICATION OF NEEDS Skin   Limited Assist Verbally Skin abrasions                       Personal Care Assistance Level of Assistance  Bathing, Feeding, Dressing Bathing Assistance: Limited assistance Feeding assistance: Limited assistance Dressing Assistance: Limited assistance     Functional Limitations Info  Sight, Speech, Hearing Sight Info:  Adequate Hearing Info: Adequate Speech Info: Adequate    SPECIAL CARE FACTORS FREQUENCY  PT (By licensed PT), OT (By licensed OT)     PT Frequency: Minimum 5x a week OT Frequency: Minimum 5x a week            Contractures Contractures Info: Not present    Additional Factors Info  Code Status, Allergies, Psychotropic, Isolation Precautions Code Status Info: DNR Allergies Info: Doxycycline   Leflunomide   Tape   Penicillins Psychotropic Info: busPIRone (BUSPAR) tablet 5 mg, citalopram (CELEXA) tablet 10 mg,   Isolation Precautions Info: Enteric precautions     Current Medications (01/29/2022):  This is the current hospital active medication list Current Facility-Administered Medications  Medication Dose Route Frequency Provider Last Rate Last Admin   acetaminophen (TYLENOL) tablet 650 mg  650 mg Oral Q4H PRN Dwyane Dee, MD   650 mg at 01/27/22 1233   Or   acetaminophen (TYLENOL) suppository 650 mg  650 mg Rectal Q4H PRN Dwyane Dee, MD       apixaban Arne Cleveland) tablet 2.5 mg  2.5 mg Oral BID Hosie Poisson, MD   2.5 mg at 01/29/22 0826   busPIRone (BUSPAR) tablet 5 mg  5 mg Oral Daily Dwyane Dee, MD   5 mg at 01/29/22 5681   citalopram (CELEXA) tablet 10 mg  10 mg Oral q morning Dwyane Dee, MD   10 mg at 27/51/70 0174   folic acid (FOLVITE) tablet 1 mg  1 mg Oral Daily Hosie Poisson, MD   1 mg at 01/29/22 0825  furosemide (LASIX) tablet 20 mg  20 mg Oral Daily Hosie Poisson, MD       gadobutrol (GADAVIST) 1 MMOL/ML injection 7 mL  7 mL Intravenous Once PRN Dwyane Dee, MD       metoprolol tartrate (LOPRESSOR) tablet 25 mg  25 mg Oral BID Dixie Dials, MD   25 mg at 01/29/22 0826   ondansetron (ZOFRAN) tablet 4 mg  4 mg Oral Q6H PRN Dwyane Dee, MD       Or   ondansetron [facility]) injection 4 mg  4 mg Intravenous Q6H PRN Dwyane Dee, MD       oxyCODONE (Oxy IR/ROXICODONE) immediate release tablet 5 mg  5 mg Oral Q4H PRN Dwyane Dee, MD       sodium  chloride flush (NS) 0.9 % injection 3 mL  3 mL Intravenous Eddie Candle, MD   3 mL at 01/29/22 0827   vancomycin (VANCOCIN) capsule 125 mg  125 mg Oral QID Hosie Poisson, MD   125 mg at 01/29/22 1731     Discharge Medications: Please see discharge summary for a list of discharge medications.  Relevant Imaging Results:  Relevant Lab Results:   Additional Information SSN [id number]  Ross Ludwig, LCSW

## 2022-01-29 NOTE — Plan of Care (Signed)
  Problem: Education: Goal: Knowledge of General Education information will improve Description: Including pain rating scale, medication(s)/side effects and non-pharmacologic comfort measures Outcome: Progressing   Problem: Safety: Goal: Ability to remain free from injury will improve Outcome: Progressing   Problem: Skin Integrity: Goal: Risk for impaired skin integrity will decrease Outcome: Progressing   

## 2022-01-29 NOTE — Progress Notes (Signed)
[facility]                                                                               Alexandria Weaver, is a 88 y.o. female, DOB - [birth year], TDD:[id number] Admit date - 01/24/2022    Outpatient Primary MD for the patient is Sueanne Margarita, DO  LOS - 5  days    Brief summary   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 She presented to [facility] ER due to N/V, abd pain.   She states that she has had some "shaking" at home.  She vomited on Sunday and has chronic intermittent diarrhea.  She also endorses having felt hot/sweaty.  On workup: Febrile with rectal temp 102 F, SPO2 70-90% on RA.  ABG showed pH 7.41, PCO2 39, PO2 43.  UA negative for UTI.  Initial lactic 2.9.  WBC 3.2, hemoglobin 9.7, platelets 108k.   She underwent work-up with CTA chest and CT abdomen/pelvis with contrast.  Findings were negative for PE.  Showed cardiomegaly with bilateral moderate pleural effusions and probable atelectasis bibasilar.  Enlarged mediastinal lymph nodes.  Small amount of gallbladder sludge, enlarged CBD up to 9 mm.  Left posterior rectal mass measuring 3.7 cm appearing slightly smaller than prior.  Underlying diverticulosis and small volume ascites.  She was initially placed on nonrebreather due to oxygen requirement and was weaned down to 5 L. She was treated with vancomycin and cefepime due to concern for sepsis/cholecystitis. [facility] felt ERCP may be warranted and patient was therefore recommended for transfer.  Pt seen and examined today, she is weaned off oxygen, diuresing appropriately. Creatinine is back to baseline,.   Assessment & Plan    Assessment and Plan:  Nausea and abdominal pain: - CT at [facility] concerning for GB sludge with CBD 38m - she was treated with vanc/cefepime due to concern for sepsis/acute cholecystitis and/or possible biliary obstruction - Mildly elevated AST,  normal bilirubin, AK phos is 30.  - MRCP ordered , it was not an optimal study. No substantial degree of intrahepatic or extrahepatic biliary dilatation. There is normal expected tapering course of the distal CBD without specific findings of choledocholithiasis.  - she is currently asymptomatic. She denies any abdominal pain and nausea. Still having some bowel movements.  Acute systolic CHF, and Acute Coronary syndrome In view of her advanced age , cardiology recommends medical management.  Started the patient on lasix 20 mg BID but cut down the dose to 20 mg daily due to rise in creatinine. Creatinine continues to improved with IV lasix, transition to oral lasix in am.  Continue with metoprolol 25 mg bID.   Acute respiratory failure with hypoxia (HCC)  Improving, suspect from acute systolic CHF.  She is off oxygen this morning.   History of DVT (deep vein thrombosis)  On eliquis 2.5 gm BID.   Anxiety Continue with celexa and buspar.   Glaucoma   Macular degeneration - Recommend follow up with ophthalmology as outpatient.   RA (rheumatoid arthritis) ([place] - On weekly methotrexate Hold off till infection clears.  GIST-rectum ([place]) - follows with cancer center (diagnosed 2020) - currently on Gleevac, which is on hold for now.  - oncology on board.  - will resume Gleevac on discharge.   Elevated lactic acid:  From acute systolic CHF.  Resolved.    Loose stools/ C diff diarrhea C diff by PCR is positive and C diff antigen is positive.  Started her on oral vancomycin to complete the course. Still having loose bowel movements.    Hypokalemia:  Replaced. Recheck level wnl.    Macrocytic anemia:  Check anemia panel and transfuse to keep hemoglobin greater than 7.  Folate replaced.  Vit b12 elevated.  Iron levels adequate.  Hemoglobin improved to 10.   Acute on stage 3a CKD;  Baseline creatinine less than 1 with GFR between 55 to 59.  Currently creatinine at 1.39.  will recheck creatinine in am and dose lasix .     Disposition: as per the daughter, patient lives alone.  Therapy eval recommending SNF. Currently waiting for SNF.  In view of her advanced age, and multiple medical problems, will get palliative care consult for goals of care.  Recommend palliative services to follow on discharge.     Estimated body mass index is 26.81 kg/m as calculated from the following:   Height as of this encounter: '5\' 2"'$  (1.575 m).   Weight as of this encounter: 66.5 kg.  Code Status: DNR DVT Prophylaxis:  apixaban (ELIQUIS) tablet 2.5 mg Start: 01/26/22 2200 SCDs Start: 01/24/22 1630 apixaban (ELIQUIS) tablet 2.5 mg   Level of Care: Level of care: Med-Surg Family Communication: none at bedside.   Disposition Plan:     Remains inpatient appropriate:  SNF placement.   Procedures:  Echocardiogram.  Consultants:   Cardiology.  Palliative care.  Oncology.   Antimicrobials:   Anti-infectives (From admission, onward)    Start     Dose/Rate Route Frequency Ordered Stop   01/26/22 1000  vancomycin (VANCOCIN) capsule 125 mg        125 mg Oral 4 times daily 01/26/22 9485 02/05/22 0959        Medications  Scheduled Meds:  apixaban  2.5 mg Oral BID   busPIRone  5 mg Oral Daily   citalopram  10 mg Oral q morning   folic acid  1 mg Oral Daily   furosemide  20 mg Intravenous Daily   metoprolol tartrate  25 mg Oral BID   sodium chloride flush  3 mL Intravenous Q12H   vancomycin  125 mg Oral QID   Continuous Infusions:   PRN Meds:.acetaminophen **OR** acetaminophen, gadobutrol, ondansetron **OR** ondansetron (ZOFRAN) IV, oxyCODONE    Subjective:   Alexandria Weaver was seen and examined today. Having loose bowel movements. Appears tired.    Objective:   Vitals:   01/29/22 0215 01/29/22 0551 01/29/22 0941 01/29/22 1410  BP: (!) 148/62 (!) 156/64 114/61 (!) 145/62  Pulse: 79 65 (!) 57 81  Resp: '18 18 17   '$ Temp: 97.9 F (36.6 C) 98.1 F  (36.7 C) 98.1 F (36.7 C) 98.1 F (36.7 C)  TempSrc: Oral Oral Oral Oral  SpO2: 95% 93% 100% 95%  Weight:      Height:        Intake/Output Summary (Last 24 hours) at 01/29/2022 1505 Last data filed at 01/29/2022 1000 Gross per 24 hour  Intake 350 ml  Output 900 ml  Net -550 ml    Filed Weights   01/24/22 1603  Weight: 66.5 kg  Exam General exam: Appears calm and comfortable  Respiratory system: Clear to auscultation. Respiratory effort normal. Cardiovascular system: S1 & S2 heard, RRR. No JVD,  No pedal edema. Gastrointestinal system: Abdomen is nondistended, soft and nontender.  Normal bowel sounds heard. Central nervous system: Alert and oriented. No focal neurological deficits. Extremities: Symmetric 5 x 5 power. Skin: No rashes, lesions or ulcers Psychiatry:  Mood & affect appropriate.        Data Reviewed:  I have personally reviewed following labs and imaging studies   CBC Lab Results  Component Value Date   WBC 12.6 (H) 01/29/2022   RBC 3.26 (L) 01/29/2022   HGB 10.0 (L) 01/29/2022   HCT 31.0 (L) 01/29/2022   MCV 95.1 01/29/2022   MCH 30.7 01/29/2022   PLT 206 01/29/2022   MCHC 32.3 01/29/2022   RDW 15.8 (H) 01/29/2022   LYMPHSABS 3.2 01/29/2022   MONOABS 2.2 (H) 01/29/2022   EOSABS 0.6 (H) 01/29/2022   BASOSABS 0.1 81/82/9937     Last metabolic panel Lab Results  Component Value Date   NA 133 (L) 01/29/2022   K 3.2 (L) 01/29/2022   CL 98 01/29/2022   CO2 28 01/29/2022   BUN 30 (H) 01/29/2022   CREATININE 0.84 01/29/2022   GLUCOSE 107 (H) 01/29/2022   GFRNONAA >60 01/29/2022   GFRAA >60 09/04/2019   CALCIUM 7.5 (L) 01/29/2022   PHOS 3.2 01/24/2022   PROT 5.5 (L) 01/29/2022   ALBUMIN 2.6 (L) 01/29/2022   BILITOT 1.3 (H) 01/29/2022   ALKPHOS 47 01/29/2022   AST 56 (H) 01/29/2022   ALT 22 01/29/2022   ANIONGAP 7 01/29/2022    CBG (last 3)  No results for input(s): "GLUCAP" in the last 72 hours.    Coagulation  Profile: Recent Labs  Lab 01/24/22 1646  INR 3.1*      Radiology Studies: No results found.     Hosie Poisson M.D. [facility] 01/29/2022, 3:05 PM  Available via Epic secure chat 7am-7pm After 7 pm, please refer to night coverage provider listed on amion.

## 2022-01-30 DIAGNOSIS — C49A5 Gastrointestinal stromal tumor of rectum: Secondary | ICD-10-CM | POA: Diagnosis not present

## 2022-01-30 DIAGNOSIS — Z515 Encounter for palliative care: Secondary | ICD-10-CM | POA: Diagnosis not present

## 2022-01-30 DIAGNOSIS — J9601 Acute respiratory failure with hypoxia: Secondary | ICD-10-CM | POA: Diagnosis not present

## 2022-01-30 LAB — BASIC METABOLIC PANEL
Anion gap: 6 (ref 5–15)
BUN: 25 mg/dL — ABNORMAL HIGH (ref 8–23)
CO2: 29 mmol/L (ref 22–32)
Calcium: 7.8 mg/dL — ABNORMAL LOW (ref 8.9–10.3)
Chloride: 98 mmol/L (ref 98–111)
Creatinine, Ser: 0.81 mg/dL (ref 0.44–1.00)
GFR, Estimated: >60 mL/min (ref >60)
Glucose, Bld: 101 mg/dL — ABNORMAL HIGH (ref 70–99)
Potassium: 3.5 mmol/L (ref 3.5–5.1)
Sodium: 133 mmol/L — ABNORMAL LOW (ref 135–145)

## 2022-01-30 NOTE — Plan of Care (Signed)
Problem: Clinical Measurements: Goal: Ability to maintain clinical measurements within normal limits will improve Outcome: Progressing   Problem: Activity: Goal: Risk for activity intolerance will decrease Outcome: Progressing   Problem: Elimination: Goal: Will not experience complications related to bowel motility Outcome: Progressing  Ivan Anchors, RN 01/30/22 12:13 PM

## 2022-01-30 NOTE — Progress Notes (Signed)
[facility]                                                                               Alexandria Weaver, is a 88 y.o. 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 She presented to [facility] ER due to N/V, abd pain.   She states that she has had some "shaking" at home.  She vomited on Sunday and has chronic intermittent diarrhea.  She also endorses having felt hot/sweaty.  On workup: Febrile with rectal temp 102 F, SPO2 70-90% on RA.  ABG showed pH 7.41, PCO2 39, PO2 43.  UA negative for UTI.  Initial lactic 2.9.  WBC 3.2, hemoglobin 9.7, platelets 108k.   She underwent work-up with CTA chest and CT abdomen/pelvis with contrast.  Findings were negative for PE.  Showed cardiomegaly with bilateral moderate pleural effusions and probable atelectasis bibasilar.  Enlarged mediastinal lymph nodes.  Small amount of gallbladder sludge, enlarged CBD up to 9 mm.  Left posterior rectal mass measuring 3.7 cm appearing slightly smaller than prior.  Underlying diverticulosis and small volume ascites.  She was initially placed on nonrebreather due to oxygen requirement and was weaned down to 5 L. She was treated with vancomycin and cefepime due to concern for sepsis/cholecystitis. [facility] surgeon felt ERCP may be warranted and patient was therefore recommended for transfer.  Pt seen and examined today, she is weaned off oxygen, diuresing appropriately. Creatinine is back to baseline,.   Assessment & Plan    Assessment and Plan:  Nausea and abdominal pain: - CT at [facility] concerning for GB sludge with CBD 46m - she was treated with vanc/cefepime due to concern for sepsis/acute cholecystitis and/or possible biliary obstruction - Mildly elevated AST,  normal bilirubin, AK phos is 30.  - MRCP ordered , it was not an optimal study. No substantial degree of intrahepatic or extrahepatic biliary dilatation. There is normal expected tapering course of the distal CBD without specific findings of choledocholithiasis.  - she is currently asymptomatic. She denies any abdominal pain and nausea. No bowel movement last night or this am.   Acute systolic CHF, and Acute Coronary syndrome In view of her advanced age , cardiology recommends medical management.  Started the patient on lasix 20 mg BID but cut down the dose to 20 mg daily due to rise in creatinine. Creatinine continues to improved with IV lasix, transitioned to oral lasix in am. Diuresed about 2.7 lit since admission.  Continue with metoprolol 25 mg bID.   Acute respiratory failure with hypoxia (HCC)  Improving, suspect from acute systolic CHF.  Weaned off oxygen.   History of DVT (deep vein thrombosis)  On eliquis 2.5 gm BID.   Anxiety Continue with celexa and buspar.   Glaucoma   Macular degeneration - Recommend follow up with ophthalmology as outpatient.   RA (rheumatoid arthritis) (HCC) - On weekly  methotrexate Hold off till infection clears.   GIST-rectum ([place]) - follows with cancer center (diagnosed 2020) - currently on Gleevac, which is on hold for now.  - oncology on board.  - will resume Gleevac on discharge.   Elevated lactic acid:  From acute systolic CHF.  Resolved.    Loose stools/ C diff diarrhea C diff by PCR is positive and C diff antigen is positive.  Started her on oral vancomycin to complete the course. Still having loose bowel movements.    Hypokalemia:  Replaced. Recheck level wnl.    Macrocytic anemia:  Check anemia panel and transfuse to keep hemoglobin greater than 7.  Folate replaced.  Vit b12 elevated.  Iron levels adequate.  Hemoglobin improved to 10.   Acute on stage 3a CKD;  Baseline creatinine less than 1 with GFR between 55 to  59.  Currently creatinine at 1.39. will recheck creatinine in am and dose lasix .   Mild leukocytosis:  Continue to monitor. Possibly from c diff.     Disposition: as per the daughter, patient lives alone.  Therapy eval recommending SNF. Currently waiting for SNF.  In view of her advanced age, and multiple medical problems, will get palliative care consult for goals of care.  Recommend palliative services to follow on discharge.     Estimated body mass index is 26.81 kg/m as calculated from the following:   Height as of this encounter: '5\' 2"'$  (1.575 m).   Weight as of this encounter: 66.5 kg.  Code Status: DNR DVT Prophylaxis:  apixaban (ELIQUIS) tablet 2.5 mg Start: 01/26/22 2200 SCDs Start: 01/24/22 1630 apixaban (ELIQUIS) tablet 2.5 mg   Level of Care: Level of care: Med-Surg Family Communication: none at bedside.   Disposition Plan:     Remains inpatient appropriate:  SNF placement.   Procedures:  Echocardiogram.  Consultants:   Cardiology.  Palliative care.  Oncology.   Antimicrobials:   Anti-infectives (From admission, onward)    Start     Dose/Rate Route Frequency Ordered Stop   01/26/22 1000  vancomycin (VANCOCIN) capsule 125 mg        125 mg Oral 4 times daily 01/26/22 1610 02/05/22 0959        Medications  Scheduled Meds:  apixaban  2.5 mg Oral BID   busPIRone  5 mg Oral Daily   citalopram  10 mg Oral q morning   folic acid  1 mg Oral Daily   furosemide  20 mg Oral Daily   metoprolol tartrate  25 mg Oral BID   sodium chloride flush  3 mL Intravenous Q12H   vancomycin  125 mg Oral QID   Continuous Infusions:   PRN Meds:.acetaminophen **OR** acetaminophen, gadobutrol, ondansetron **OR** ondansetron (ZOFRAN) IV, oxyCODONE    Subjective:   Alexandria Weaver was seen and examined today. Slightly confused this am.    Objective:   Vitals:   01/29/22 2203 01/30/22 0140 01/30/22 0522 01/30/22 1213  BP: 138/66 (!) 134/52 (!) 151/56 (!) 122/45   Pulse: 88 63 61 66  Resp: '18 16 18 18  '$ Temp: 97.8 F (36.6 C) 98.6 F (37 C) 98 F (36.7 C) 98.9 F (37.2 C)  TempSrc:  Oral Oral Oral  SpO2: 95% 95% 95% 95%  Weight:      Height:        Intake/Output Summary (Last 24 hours) at 01/30/2022 1419 Last data filed at 01/30/2022 1000 Gross per 24 hour  Intake 480 ml  Output 600 ml  Net -  120 ml    Filed Weights   01/24/22 1603  Weight: 66.5 kg     Exam General exam:elderly woman, does not appear to be in distress.  Respiratory system: Clear to auscultation. Respiratory effort normal. Cardiovascular system: S1 & S2 heard, RRR. No JVD,  No pedal edema. Gastrointestinal system: Abdomen is nondistended, soft and nontender.  Normal bowel sounds heard. Central nervous system: Alert and oriented to place and person.  Extremities: no pedal edema.  Skin: No rashes,  Psychiatry:  Mood & affect appropriate.         Data Reviewed:  I have personally reviewed following labs and imaging studies   CBC Lab Results  Component Value Date   WBC 12.6 (H) 01/29/2022   RBC 3.26 (L) 01/29/2022   HGB 10.0 (L) 01/29/2022   HCT 31.0 (L) 01/29/2022   MCV 95.1 01/29/2022   MCH 30.7 01/29/2022   PLT 206 01/29/2022   MCHC 32.3 01/29/2022   RDW 15.8 (H) 01/29/2022   LYMPHSABS 3.2 01/29/2022   MONOABS 2.2 (H) 01/29/2022   EOSABS 0.6 (H) 01/29/2022   BASOSABS 0.1 18/84/1660     Last metabolic panel Lab Results  Component Value Date   NA 133 (L) 01/30/2022   K 3.5 01/30/2022   CL 98 01/30/2022   CO2 29 01/30/2022   BUN 25 (H) 01/30/2022   CREATININE 0.81 01/30/2022   GLUCOSE 101 (H) 01/30/2022   GFRNONAA >60 01/30/2022   GFRAA >60 09/04/2019   CALCIUM 7.8 (L) 01/30/2022   PHOS 3.2 01/24/2022   PROT 5.5 (L) 01/29/2022   ALBUMIN 2.6 (L) 01/29/2022   BILITOT 1.3 (H) 01/29/2022   ALKPHOS 47 01/29/2022   AST 56 (H) 01/29/2022   ALT 22 01/29/2022   ANIONGAP 6 01/30/2022    CBG (last 3)  No results for input(s): "GLUCAP" in  the last 72 hours.    Coagulation Profile: Recent Labs  Lab 01/24/22 1646  INR 3.1*      Radiology Studies: No results found.     Hosie Poisson M.D. [facility] 01/30/2022, 2:19 PM  Available via Epic secure chat 7am-7pm After 7 pm, please refer to night coverage provider listed on amion.

## 2022-01-30 NOTE — Progress Notes (Signed)
Physical Therapy Treatment Patient Details Name: Alexandria Weaver MRN: [id number] DOB: 1924-07-28 Today's Date: 01/30/2022   History of Present Illness 88 year old lady who was living by herself up until very recently, history of GI stromal tumor of the rectum, has been maintained on [place], initially presented to [facility] emergency room with nausea vomiting abdominal pain and fever, CT revealed enlarged common bile duct, left posterior rectal mass appeared smaller.  She was placed on IV antibiotics and transferred to [facility] long.    PT Comments    Pt  progressing, cooperative and motivated.  improved activity tolerance and improved effort/decreasing assist needed with STS transfers. Will continue to efforts to progress pt/   Continue to recommend SNF    Recommendations for follow up therapy are one component of a multi-disciplinary discharge planning process, led by the attending physician.  Recommendations may be updated based on patient status, additional functional criteria and insurance authorization.  Follow Up Recommendations  Skilled nursing-short term rehab (<3 hours/day) Can patient physically be transported by private vehicle: No   Assistance Recommended at Discharge Frequent or constant Supervision/Assistance  Patient can return home with the following Two people to help with walking and/or transfers;A lot of help with bathing/dressing/bathroom;Assistance with cooking/housework;Assist for transportation;Help with stairs or ramp for entrance   Equipment Recommendations  None recommended by PT    Recommendations for Other Services       Precautions / Restrictions Precautions Precautions: Fall Restrictions Weight Bearing Restrictions: No     Mobility  Bed Mobility Overal bed mobility: Needs Assistance     Sidelying to sit: Mod assist       General bed mobility comments: assist to initiate roll and reach for rail, assist to elevate trunk     Transfers Overall transfer level: Needs assistance Equipment used: Rolling walker (2 wheels) Transfers: Sit to/from Stand Sit to Stand: +2 safety/equipment, +2 physical assistance, Mod assist           General transfer comment: repeated STS x 5. mod assist with anterior- superior wt shift. cues hand placement, trunk, hip extension. stedy utilized for pivot. fatigues easily Transfer via Lift Equipment: Stedy  Ambulation/Gait                   Stairs             Wheelchair Mobility    Modified Rankin (Stroke Patients Only)       Balance                                            Cognition Arousal/Alertness: Awake/alert Behavior During Therapy: WFL for tasks assessed/performed Overall Cognitive Status: Within Functional Limits for tasks assessed                                          Exercises  Marching in stand x3, with mod assist of 2 for balance    General Comments        Pertinent Vitals/Pain Pain Assessment Pain Assessment: No/denies pain    Home Living                          Prior Function            PT Goals (current  goals can now be found in the care plan section) Acute Rehab PT Goals Patient Stated Goal: to get back to walking, agreeable to ST-SNF PT Goal Formulation: With patient/family Time For Goal Achievement: 02/10/22 Potential to Achieve Goals: Good Progress towards PT goals: Progressing toward goals    Frequency    Min 2X/week      PT Plan Current plan remains appropriate    Co-evaluation              AM-PAC PT "6 Clicks" Mobility   Outcome Measure  Help needed turning from your back to your side while in a flat bed without using bedrails?: A Lot Help needed moving from lying on your back to sitting on the side of a flat bed without using bedrails?: Total Help needed moving to and from a bed to a chair (including a wheelchair)?: Total Help needed  standing up from a chair using your arms (e.g., wheelchair or bedside chair)?: Total Help needed to walk in hospital room?: Total Help needed climbing 3-5 steps with a railing? : Total 6 Click Score: 7    End of Session Equipment Utilized During Treatment: Gait belt Activity Tolerance: Patient limited by fatigue Patient left: in chair;with chair alarm set;with call bell/phone within reach Nurse Communication: Mobility status PT Visit Diagnosis: Difficulty in walking, not elsewhere classified (R26.2);Muscle weakness (generalized) (M62.81)     Time: 1102-1117 PT Time Calculation (min) (ACUTE ONLY): 17 min  Charges:  $Therapeutic Activity: 8-22 mins                     Baxter Flattery, PT  Acute Rehab Dept [facility]) [phone]  [facility] Weekend Pager [facility] only)  [phone]  01/30/2022    [facility] 01/30/2022, 1:58 PM

## 2022-01-31 ENCOUNTER — Encounter: Payer: Self-pay | Admitting: *Deleted

## 2022-01-31 DIAGNOSIS — J9601 Acute respiratory failure with hypoxia: Secondary | ICD-10-CM | POA: Diagnosis not present

## 2022-01-31 DIAGNOSIS — E876 Hypokalemia: Secondary | ICD-10-CM | POA: Diagnosis not present

## 2022-01-31 DIAGNOSIS — I5021 Acute systolic (congestive) heart failure: Secondary | ICD-10-CM | POA: Diagnosis not present

## 2022-01-31 DIAGNOSIS — C49A0 Gastrointestinal stromal tumor, unspecified site: Secondary | ICD-10-CM | POA: Diagnosis not present

## 2022-01-31 DIAGNOSIS — M069 Rheumatoid arthritis, unspecified: Secondary | ICD-10-CM | POA: Diagnosis not present

## 2022-01-31 DIAGNOSIS — F419 Anxiety disorder, unspecified: Secondary | ICD-10-CM | POA: Diagnosis not present

## 2022-01-31 DIAGNOSIS — R251 Tremor, unspecified: Secondary | ICD-10-CM | POA: Diagnosis not present

## 2022-01-31 DIAGNOSIS — M6281 Muscle weakness (generalized): Secondary | ICD-10-CM | POA: Diagnosis not present

## 2022-01-31 DIAGNOSIS — C49A5 Gastrointestinal stromal tumor of rectum: Secondary | ICD-10-CM | POA: Diagnosis not present

## 2022-01-31 DIAGNOSIS — R41841 Cognitive communication deficit: Secondary | ICD-10-CM | POA: Diagnosis not present

## 2022-01-31 DIAGNOSIS — A0472 Enterocolitis due to Clostridium difficile, not specified as recurrent: Secondary | ICD-10-CM | POA: Diagnosis not present

## 2022-01-31 DIAGNOSIS — S37009A Unspecified injury of unspecified kidney, initial encounter: Secondary | ICD-10-CM | POA: Diagnosis not present

## 2022-01-31 DIAGNOSIS — R2689 Other abnormalities of gait and mobility: Secondary | ICD-10-CM | POA: Diagnosis not present

## 2022-01-31 DIAGNOSIS — N183 Chronic kidney disease, stage 3 unspecified: Secondary | ICD-10-CM | POA: Diagnosis not present

## 2022-01-31 DIAGNOSIS — F32A Depression, unspecified: Secondary | ICD-10-CM | POA: Diagnosis not present

## 2022-01-31 DIAGNOSIS — N179 Acute kidney failure, unspecified: Secondary | ICD-10-CM | POA: Diagnosis not present

## 2022-01-31 DIAGNOSIS — R531 Weakness: Secondary | ICD-10-CM | POA: Diagnosis not present

## 2022-01-31 DIAGNOSIS — J9620 Acute and chronic respiratory failure, unspecified whether with hypoxia or hypercapnia: Secondary | ICD-10-CM | POA: Diagnosis not present

## 2022-01-31 LAB — CBC WITH DIFFERENTIAL/PLATELET
Abs Immature Granulocytes: 0.18 10*3/uL — ABNORMAL HIGH (ref 0.00–0.07)
Basophils Absolute: 0.1 10*3/uL (ref 0.0–0.1)
Basophils Relative: 1 %
Eosinophils Absolute: 0.4 10*3/uL (ref 0.0–0.5)
Eosinophils Relative: 4 %
HCT: 28 % — ABNORMAL LOW (ref 36.0–46.0)
Hemoglobin: 9 g/dL — ABNORMAL LOW (ref 12.0–15.0)
Immature Granulocytes: 2 %
Lymphocytes Relative: 17 %
Lymphs Abs: 2.1 10*3/uL (ref 0.7–4.0)
MCH: 30.4 pg (ref 26.0–34.0)
MCHC: 32.1 g/dL (ref 30.0–36.0)
MCV: 94.6 fL (ref 80.0–100.0)
Monocytes Absolute: 1.4 10*3/uL — ABNORMAL HIGH (ref 0.1–1.0)
Monocytes Relative: 12 %
Neutro Abs: 8 10*3/uL — ABNORMAL HIGH (ref 1.7–7.7)
Neutrophils Relative %: 64 %
Platelets: 271 10*3/uL (ref 150–400)
RBC: 2.96 MIL/uL — ABNORMAL LOW (ref 3.87–5.11)
RDW: 16.1 % — ABNORMAL HIGH (ref 11.5–15.5)
WBC: 12.1 10*3/uL — ABNORMAL HIGH (ref 4.0–10.5)
nRBC: 0 % (ref 0.0–0.2)

## 2022-01-31 LAB — BASIC METABOLIC PANEL
Anion gap: 7 (ref 5–15)
BUN: 23 mg/dL (ref 8–23)
CO2: 28 mmol/L (ref 22–32)
Calcium: 7.4 mg/dL — ABNORMAL LOW (ref 8.9–10.3)
Chloride: 96 mmol/L — ABNORMAL LOW (ref 98–111)
Creatinine, Ser: 0.71 mg/dL (ref 0.44–1.00)
GFR, Estimated: >60 mL/min (ref >60)
Glucose, Bld: 93 mg/dL (ref 70–99)
Potassium: 3 mmol/L — ABNORMAL LOW (ref 3.5–5.1)
Sodium: 131 mmol/L — ABNORMAL LOW (ref 135–145)

## 2022-01-31 LAB — MAGNESIUM: Magnesium: 1.6 mg/dL — ABNORMAL LOW (ref 1.7–2.4)

## 2022-01-31 MED ORDER — FUROSEMIDE 20 MG PO TABS: 20.0000 mg | ORAL_TABLET | Freq: Every day | ORAL | 2 refills | Status: DC

## 2022-01-31 MED ORDER — VANCOMYCIN HCL 125 MG PO CAPS: 125.0000 mg | ORAL_CAPSULE | Freq: Four times a day (QID) | ORAL | 0 refills | Status: AC

## 2022-01-31 MED ORDER — MAGNESIUM SULFATE 2 GM/50ML IV SOLN: 2.0000 g | Freq: Once | INTRAVENOUS | Status: DC

## 2022-01-31 MED ORDER — POTASSIUM CHLORIDE CRYS ER 20 MEQ PO TBCR
40.0000 meq | EXTENDED_RELEASE_TABLET | Freq: Two times a day (BID) | ORAL | Status: DC
  Administered 2022-01-31: 40 meq via ORAL
  Filled 2022-01-31: qty 2

## 2022-01-31 MED ORDER — APIXABAN 2.5 MG PO TABS: 2.5000 mg | ORAL_TABLET | Freq: Two times a day (BID) | ORAL | 2 refills | Status: DC

## 2022-01-31 MED ORDER — POTASSIUM CHLORIDE CRYS ER 20 MEQ PO TBCR: 40.0000 meq | EXTENDED_RELEASE_TABLET | Freq: Every day | ORAL | 0 refills | Status: AC

## 2022-01-31 MED ORDER — METHOTREXATE SODIUM 2.5 MG PO TABS: 12.5000 mg | ORAL_TABLET | ORAL | 0 refills | Status: DC

## 2022-01-31 NOTE — Progress Notes (Signed)
Oncology Discharge Planning Note  [facility] Address: [address], [place], [place] [address] Hours of Operation:  Nena Polio, Monday - Friday  Clinic Contact Information:  [phone]  Oncology Care Team: Medical Oncologist:  Benay Spice  Patient Details: Name:  Alexandria Weaver, Alexandria Weaver MRN:   [id number] DOB:   25-May-1924 Reason for Current Admission: '@PPROB'$ @  Discharge Planning Narrative: Notification of admission received by inpatient staff for Alexandria Weaver.  Discharge follow-up appointments for oncology are current and available on the AVS and MyChart.   Upon discharge from the hospital, hematology/oncology's post discharge plan of care for the outpatient setting is:  November 16,2023 at [facility] [place], [place] [address] [phone]   Alexandria Weaver will be called within two business days after discharge to review hematology/oncology's plan of care for full understanding.    Outpatient Oncology Specific Care Only: Oncology appointment transportation needs addressed?:  no Oncology medication management for symptom management addressed?:  no Chemo Alert Card reviewed?:  no Immunotherapy Alert Card reviewed?:  no

## 2022-01-31 NOTE — Plan of Care (Signed)
Pt ready to DC to [facility] via [facility].

## 2022-01-31 NOTE — Discharge Summary (Signed)
Physician Discharge Summary   Patient: Alexandria Weaver MRN: [id number] DOB: [birth year]  Admit date:     01/24/2022  Discharge date: 01/31/22  Discharge Physician: Hosie Poisson   PCP: Sueanne Margarita, DO   Recommendations at discharge:  Recommend outpatient follow up with palliative care services.  Please follow up with PCP in one week.   Discharge Diagnoses: Principal Problem:   Abdominal pain Active Problems:   Edema   History of DVT (deep vein thrombosis)   Acute respiratory failure with hypoxia (HCC)   GIST-rectum (HCC)   RA (rheumatoid arthritis) ([place])   Macular degeneration   Glaucoma   Anxiety   Tremor    Hospital Course: 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 She presented to [facility] ER due to N/V, abd pain.   She states that she has had some "shaking" at home.  She vomited on Sunday and has chronic intermittent diarrhea.  She also endorses having felt hot/sweaty.  On workup: Febrile with rectal temp 102 F, SPO2 70-90% on RA.  ABG showed pH 7.41, PCO2 39, PO2 43.  UA negative for UTI.  Initial lactic 2.9.  WBC 3.2, hemoglobin 9.7, platelets 108k.   She underwent work-up with CTA chest and CT abdomen/pelvis with contrast.  Findings were negative for PE.  Showed cardiomegaly with bilateral moderate pleural effusions and probable atelectasis bibasilar.  Enlarged mediastinal lymph nodes.  Small amount of gallbladder sludge, enlarged CBD up to 9 mm.  Left posterior rectal mass measuring 3.7 cm appearing slightly smaller than prior.  Underlying diverticulosis and small volume ascites.  She was initially placed on nonrebreather due to oxygen requirement and was weaned down to 5 L. She was treated with vancomycin and cefepime due to concern for sepsis/cholecystitis. [facility] surgeon felt ERCP may be warranted and patient was therefore recommended for transfer.   Pt seen and examined today, she is  weaned off oxygen, diuresing appropriately. Creatinine is back to baseline,.   Assessment and Plan: ausea and abdominal pain: - CT at [facility] concerning for GB sludge with CBD 36m - she was treated with vanc/cefepime due to concern for sepsis/acute cholecystitis and/or possible biliary obstruction - Mildly elevated AST, normal bilirubin, AK phos is 30.  - MRCP ordered , it was not an optimal study. No substantial degree of intrahepatic or extrahepatic biliary dilatation. There is normal expected tapering course of the distal CBD without specific findings of choledocholithiasis.  - she is currently asymptomatic. She denies any abdominal pain and nausea. No bowel movement last night or this am.    Acute systolic CHF, and Acute Coronary syndrome In view of her advanced age , cardiology recommends medical management.  Started the patient on lasix 20 mg BID but cut down the dose to 20 mg daily due to rise in creatinine. Creatinine continues to improved with IV lasix, transitioned to oral lasix in am. Diuresed about 2.7 lit since admission.  Continue with metoprolol 25 mg bID.    Acute respiratory failure with hypoxia (HCC)  Improving, suspect from acute systolic CHF.  Weaned off oxygen.    History of DVT (deep vein thrombosis)  On eliquis 2.5 gm BID.    Anxiety Continue with celexa and buspar.    Glaucoma     Macular degeneration - Recommend follow up with ophthalmology as outpatient.    [place]umatoid [place] - On weekly methotrexate Hold off till infection  clears.    GIST-rectum ([place]) - follows with cancer center (diagnosed 2020) - currently on Gleevac, which is on hold for now.  - oncology on board.  - will resume Gleevac on discharge.    Elevated lactic acid:  From acute systolic CHF.  Resolved.      Loose stools/ C diff diarrhea C diff by PCR is positive and C diff antigen is positive.  Started her on oral vancomycin to complete the course. Still having loose  bowel movements.      Hypokalemia:  Replaced. Recheck level wnl.     Macrocytic anemia:  Check anemia panel and transfuse to keep hemoglobin greater than 7.  Folate replaced.  Vit b12 elevated.  Iron levels adequate.  Hemoglobin improved to 10.    Acute on stage 3a CKD;  Baseline creatinine less than 1 with GFR between 55 to 59.  Currently creatinine at 1.39. will recheck creatinine in am and dose lasix .    Mild leukocytosis:  Continue to monitor. Possibly from c diff.        Disposition: as per the daughter, patient lives alone.  Therapy eval recommending SNF. Currently waiting for SNF.  In view of her advanced age, and multiple medical problems, will get palliative care consult for goals of care.  Recommend palliative services to follow on discharge.        Estimated body mass index is 26.81 kg/m as calculated from the following:   Height as of this encounter: '5\' 2"'$  (1.575 m).   Weight as of this encounter: 66.5 kg  Consultants: oncology cardiology Procedures performed: none  Disposition: Home Diet recommendation:  Regular diet DISCHARGE MEDICATION: Allergies as of 01/31/2022       Reactions   Doxycycline Nausea Only   Leflunomide Diarrhea   Tape Other (See Comments)   Irritates the skin- SKIN TEARS AND BRUISES VERY EASILY!!   Penicillins Rash        Medication List     STOP taking these medications    cyanocobalamin 1000 MCG/ML injection Commonly known as: VITAMIN B12   rivaroxaban 10 MG Tabs tablet Commonly known as: XARELTO   triamcinolone cream 0.1 % Commonly known as: KENALOG       TAKE these medications    acetaminophen 500 MG tablet Commonly known as: TYLENOL Take 1,000 mg by mouth at bedtime.   apixaban 2.5 MG Tabs tablet Commonly known as: ELIQUIS Take 1 tablet (2.5 mg total) by mouth 2 (two) times daily.   busPIRone 5 MG tablet Commonly known as: BUSPAR Take 5 mg by mouth in the morning.   cholecalciferol 25 MCG (1000  UNIT) tablet Commonly known as: VITAMIN D3 Take 1,000 Units by mouth daily.   citalopram 20 MG tablet Commonly known as: CELEXA Take 10 mg by mouth in the morning. What changed: Another medication with the same name was removed. Continue taking this medication, and follow the directions you see here.   denosumab 60 MG/ML Sosy injection Commonly known as: PROLIA Inject 60 mg into the skin every 6 (six) months.   folic acid 1 MG tablet Commonly known as: FOLVITE Take 1 mg by mouth See admin instructions. Take 1 mg by mouth in the morning and at bedtime on Sun/Mon/Wed/Thurs/Fri/Sat- NOT on Tuesday   furosemide 20 MG tablet Commonly known as: LASIX Take 1 tablet (20 mg total) by mouth daily. Start taking on: February 01, 2022   imatinib 100 MG tablet Commonly known as: GLEEVEC Take 2 tablets (200 mg  total) by mouth daily. Take with meals and large glass of water.Caution:Chemotherapy What changed:  how much to take when to take this additional instructions   loratadine 10 MG tablet Commonly known as: CLARITIN Take 10 mg by mouth daily.   methotrexate 2.5 MG tablet Commonly known as: RHEUMATREX Take 5 tablets (12.5 mg total) by mouth See admin instructions. Take 12.5 mg by mouth in the morning and at bedtime on Tuesday ONLY Start taking on: February 05, 2022 What changed: These instructions start on February 05, 2022. If you are unsure what to do until then, ask your doctor or other care provider.   metoprolol succinate 25 MG 24 hr tablet Commonly known as: Toprol XL Take 1 tablet (25 mg total) by mouth daily. What changed:  how much to take when to take this   potassium chloride SA 20 MEQ tablet Commonly known as: KLOR-CON M Take 2 tablets (40 mEq total) by mouth daily for 2 days.   vancomycin 125 MG capsule Commonly known as: VANCOCIN Take 1 capsule (125 mg total) by mouth 4 (four) times daily for 5 days.        Contact information for after-discharge care      Destination     [facility] .   Service: Skilled Nursing Contact information: [address] [place] [phone]                    Discharge Exam: Danley Danker Weights   01/24/22 1603  Weight: 66.5 kg   General exam: Appears calm and comfortable  Respiratory system: Clear to auscultation. Respiratory effort normal. Cardiovascular system: S1 & S2 heard, RRR. No JVD, murmurs, rubs, gallops or clicks. No pedal edema. Gastrointestinal system: Abdomen is nondistended, soft and nontender. No organomegaly or masses felt. Normal bowel sounds heard. Central nervous system: Alert and oriented. No focal neurological deficits. Extremities: Symmetric 5 x 5 power. Skin: No rashes, lesions or ulcers Psychiatry: Judgement and insight appear normal. Mood & affect appropriate.    Condition at discharge: fair  The results of significant diagnostics from this hospitalization (including imaging, microbiology, ancillary and laboratory) are listed below for reference.   Imaging Studies: MR ABDOMEN WO CONTRAST  Result Date: 01/25/2022 CLINICAL DATA:  Possible biliary stones. EXAM: MRI ABDOMEN WITHOUT CONTRAST TECHNIQUE: Multiplanar multisequence MR imaging was performed without the administration of intravenous contrast. COMPARISON:  CT abdomen 01/23/2022 and ultrasound from 01/24/2022 FINDINGS: The patient terminated the exam with only 7 sequences completed, and was not receptive to further MRI imaging. The had refused MR imaging the night before. We obtain the best exam we could under the circumstances. Lower chest: Moderate right and small left pleural effusion with passive atelectasis in the lower lobes. Mild cardiomegaly. Hepatobiliary: Common bile duct caliber currently 0.6 cm, within normal limits for the patient's age. There is normal expected tapering course of the distal CBD without a filling defect  identified. No substantial degree of intrahepatic biliary dilatation. Suspected sludge in the gallbladder posteriorly. Suspected tiny gallstones in the gallbladder dependently on image 23 series 5. Nonspecific 4 mm T2 hyperintense lesion in the left hepatic lobe on image 14 series 7, likely cysts but technically nonspecific. Pancreas:  Unremarkable Spleen:  Unremarkable Adrenals/Urinary Tract:  No significant abnormality identified. Stomach/Bowel: Mildly dilated loop of jejunum in the left abdomen measuring 3.1 cm in diameter on image 14 series 7. A specific cause for this mild localized bowel dilatation is  not identified. Vascular/Lymphatic: No specific abnormality identified. No upper abdominal adenopathy. Other: Mild to moderate upper abdominal ascites. Substantial subcutaneous edema especially along the flanks. Musculoskeletal: Degenerative endplate findings at A2-1. Lumbar degenerative disc disease. Prior vertebral augmentation at L1. IMPRESSION: 1. The patient terminated the exam with only 7 sequences completed, and was not receptive to further MRI imaging or completion of the exam. We obtain the best exam we could under the circumstances. 2. No substantial degree of intrahepatic or extrahepatic biliary dilatation. There is normal expected tapering course of the distal CBD without specific findings of choledocholithiasis. 3. Suspected tiny gallstones in the gallbladder dependently. 4. Moderate right and small left pleural effusion with passive atelectasis in the lower lobes. 5. Mild to moderate upper abdominal ascites. Substantial subcutaneous edema especially along the flanks. 6. Degenerative endplate findings at H0-8. 7. Mildly dilated loop of jejunum in the left abdomen measuring 3.1 cm in diameter. A specific cause for this mild localized bowel dilatation is not identified. Electronically Signed   By: Van Clines M.D.   On: 01/25/2022 15:30   ECHOCARDIOGRAM COMPLETE  Result Date: 01/25/2022     ECHOCARDIOGRAM REPORT   Patient Name:   Charrie Alexandria Weaver Date of Exam: 01/25/2022 Medical Rec #:  [id number]      Height:       62.0 in Accession #:    [id number]     Weight:       146.6 lb Date of Birth:  06-Jan-1925     BSA:          1.675 m Patient Age:    88 years       BP:           130/50 mmHg Patient Gender: F              HR:           95 bpm. Exam Location:  Inpatient Procedure: 2D Echo, Color Doppler and Cardiac Doppler Indications:     Acute CHF  History:         Patient has prior history of Echocardiogram examinations, most                  recent 06/10/2014.  Sonographer:     Eartha Inch Referring Phys:  [place] Diagnosing Phys: Dixie Dials MD  Sonographer Comments: Technically difficult study due to poor echo windows. Image acquisition challenging due to patient body habitus and Image acquisition challenging due to respiratory motion. IMPRESSIONS  1. Left ventricular ejection fraction, by estimation, is 50 to 55%. The left ventricle has low normal function. The left ventricle has no regional wall motion abnormalities. Left ventricular diastolic parameters are consistent with Grade I diastolic dysfunction (impaired relaxation).  2. Right ventricular systolic function is normal. The right ventricular size is normal.  3. Left atrial size was moderately dilated.  4. Right atrial size was mildly dilated.  5. Posterior leaflet mod decreased of the mitral valve leaflets.  6. The mitral valve is degenerative. Mild mitral valve regurgitation. Mild mitral stenosis.  7. Tricuspid valve regurgitation is moderate.  8. The aortic valve is tricuspid. There is mild calcification of the aortic valve. There is mild thickening of the aortic valve. Aortic valve regurgitation is not visualized. Aortic valve sclerosis is present, with no evidence of aortic valve stenosis. FINDINGS  Left Ventricle: Left ventricular ejection fraction, by estimation, is 50 to 55%. The left ventricle has low normal function. The left  ventricle has no regional wall  motion abnormalities. The left ventricular internal cavity size was normal in size. There is no left ventricular hypertrophy. Left ventricular diastolic parameters are consistent with Grade I diastolic dysfunction (impaired relaxation). Right Ventricle: The right ventricular size is normal. No increase in right ventricular wall thickness. Right ventricular systolic function is normal. Left Atrium: Left atrial size was moderately dilated. Right Atrium: Right atrial size was mildly dilated. Pericardium: There is no evidence of pericardial effusion. Mitral Valve: The mitral valve is degenerative in appearance. There is mild thickening of the mitral valve leaflet(s). There is mild calcification of the mitral valve leaflet(s). Posterior leaflet mod decreased of the mitral valve leaflets. Mild mitral annular calcification. Mild mitral valve regurgitation. Mild mitral valve stenosis. Tricuspid Valve: The tricuspid valve is normal in structure. Tricuspid valve regurgitation is moderate. Aortic Valve: The aortic valve is tricuspid. There is mild calcification of the aortic valve. There is mild thickening of the aortic valve. There is mild aortic valve annular calcification. Aortic valve regurgitation is not visualized. Aortic valve sclerosis is present, with no evidence of aortic valve stenosis. Pulmonic Valve: The pulmonic valve was normal in structure. Pulmonic valve regurgitation is not visualized. Aorta: The aortic root is normal in size and structure. There is minimal (Grade I) atheroma plaque involving the ascending aorta. Venous: The inferior vena cava was not well visualized. IAS/Shunts: The atrial septum is grossly normal.  LEFT VENTRICLE PLAX 2D LVIDd:         4.40 cm     Diastology LVIDs:         3.50 cm     LV e' medial:    4.33 cm/s LV PW:         0.80 cm     LV E/e' medial:  24.9 LV IVS:        0.80 cm     LV e' lateral:   3.37 cm/s LVOT diam:     1.70 cm     LV E/e' lateral: 31.9  LV SV:         58 LV SV Index:   35 LVOT Area:     2.27 cm  LV Volumes (MOD) LV vol d, MOD A2C: 49.5 ml LV vol d, MOD A4C: 79.1 ml LV vol s, MOD A2C: 25.2 ml LV vol s, MOD A4C: 34.2 ml LV SV MOD A2C:     24.3 ml LV SV MOD A4C:     79.1 ml LV SV MOD BP:      37.0 ml RIGHT VENTRICLE RV S prime:     11.20 cm/s TAPSE (M-mode): 2.0 cm LEFT ATRIUM           Index        RIGHT ATRIUM           Index LA diam:      3.70 cm 2.21 cm/m   RA Area:     14.00 cm LA Vol (A4C): 68.9 ml 41.13 ml/m  RA Volume:   32.10 ml  19.16 ml/m  AORTIC VALVE LVOT Vmax:   135.00 cm/s LVOT Vmean:  95.500 cm/s LVOT VTI:    0.257 m  AORTA Ao Root diam: 2.90 cm Ao Asc diam:  3.20 cm MITRAL VALVE                TRICUSPID VALVE MV Area (PHT): 2.33 cm     TR Peak grad:   30.9 mmHg MV Decel Time: 326 msec     TR Mean grad:   21.0  mmHg MV E velocity: 107.67 cm/s  TR Vmax:        278.00 cm/s MV A velocity: 153.00 cm/s  TR Vmean:       218.0 cm/s MV E/A ratio:  0.70                             SHUNTS                             Systemic VTI:  0.26 m                             Systemic Diam: 1.70 cm Dixie Dials MD Electronically signed by Dixie Dials MD Signature Date/Time: 01/25/2022/12:58:01 PM    Final    DG CHEST PORT 1 VIEW  Result Date: 01/24/2022 CLINICAL DATA:  Acute hypoxemic respiratory failure EXAM: PORTABLE CHEST 1 VIEW COMPARISON:  Yesterday from [facility] [place] FINDINGS: Numerous leads and wires project over the chest. Midline trachea. Mild cardiomegaly. Small right and trace left pleural effusions, similar given differences in technique. No pneumothorax. Suspect underlying pulmonary venous congestion, accentuated by low lung volumes. Slightly worsened right greater than left base airspace disease. Upper lumbar vertebral augmentation. IMPRESSION: Slightly worsened aeration with diminished lung volumes and progressive bibasilar airspace disease. Pulmonary venous congestion with similar small bilateral pleural effusions.  Electronically Signed   By: Abigail Miyamoto M.D.   On: 01/24/2022 17:40    Microbiology: Results for orders placed or performed during the hospital encounter of 01/24/22  C Difficile Quick Screen w PCR reflex     Status: Abnormal   Collection Time: 01/25/22  2:26 PM   Specimen: STOOL  Result Value Ref Range Status   C Diff antigen POSITIVE (A) NEGATIVE Final   C Diff toxin NEGATIVE NEGATIVE Final   C Diff interpretation Results are indeterminate. See PCR results.  Final    Comment: Performed at [facility], [place] [address], [place], [place] [address]  C. Diff by PCR, Reflexed     Status: Abnormal   Collection Time: 01/25/22  2:26 PM  Result Value Ref Range Status   Toxigenic C. Difficile by PCR POSITIVE (A) NEGATIVE Final    Comment: Positive for toxigenic C. difficile with little to no toxin production. Only treat if clinical presentation suggests symptomatic illness. Performed at [facility], [place] [address] [place], [place] [address]     Labs: CBC: Recent Labs  Lab 01/26/22 0400 01/27/22 0343 01/28/22 0418 01/29/22 0353 01/31/22 0343  WBC 10.0 9.0 9.0 12.6* 12.1*  NEUTROABS 7.4 5.3 4.1 6.3 8.0*  HGB 8.1* 8.9* 9.5* 10.0* 9.0*  HCT 25.4* 28.1* 30.5* 31.0* 28.0*  MCV 100.8* 99.3 97.8 95.1 94.6  PLT 126* 132* 166 206 417   Basic Metabolic Panel: Recent Labs  Lab 01/24/22 1646 01/25/22 0658 01/26/22 0400 01/27/22 0343 01/28/22 0418 01/29/22 0353 01/30/22 0349 01/31/22 0343 01/31/22 1035  NA 136   < > 137 137 136 133* 133* 131*  --   K 4.5   < > 3.4* 3.9 3.5 3.2* 3.5 3.0*  --   CL 108   < > 104 104 100 98 98 96*  --   CO2 20*   < > '24 28 29 28 29 28  '$ --   GLUCOSE 72   < > 98 106* 90 107*  101* 93  --   BUN 24*   < > 38* 40* 37* 30* 25* 23  --   CREATININE 1.07*   < > 1.39* 1.14* 0.93 0.84 0.81 0.71  --   CALCIUM 7.5*   < > 8.0* 8.1* 7.9* 7.5* 7.8* 7.4*  --   MG 1.5*   < > 2.2 1.9 1.8 1.7  --   --  1.6*  PHOS 3.2  --   --   --   --   --   --    --   --    < > = values in this interval not displayed.   Liver Function Tests: Recent Labs  Lab 01/25/22 0658 01/26/22 0400 01/27/22 0343 01/28/22 0418 01/29/22 0353  AST 60* 36 32 43* 56*  ALT '18 15 16 19 22  '$ ALKPHOS 30* 28* 32* 43 47  BILITOT 1.2 1.1 1.3* 1.4* 1.3*  PROT 6.0* 5.5* 5.3* 5.3* 5.5*  ALBUMIN 3.4* 3.2* 3.0* 2.8* 2.6*   CBG: No results for input(s): "GLUCAP" in the last 168 hours.  Discharge time spent: 40 minutes  Signed: Hosie Poisson, MD [facility] 01/31/2022

## 2022-01-31 NOTE — TOC Transition Note (Signed)
Transition of Care (TOC) - CM/SW Discharge Note   Patient Details  Name: Alexandria Weaver MRN: [id number] Date of Birth: 03/02/1925  Transition of Care (TOC) CM/SW Contact:  , , LCSW Phone Number: 01/31/2022, 12:30 PM   Clinical Narrative:     Met with pt and daughter yesterday afternoon to review SNF bed offers and they accepted bed with [facility] with admission planned for today.  PTAR called at 12:30pm.  RN to call report to [phone]>  No further TOC needs.  Final next level of care: Skilled Nursing Facility Barriers to Discharge: Barriers Resolved   Patient Goals and CMS Choice Patient states their goals for this hospitalization and ongoing recovery are:: to go home      Discharge Placement              Patient chooses bed at: Other - please specify in the comment section below: ([facility] SNF) Patient to be transferred to facility by: PTAR Name of family member notified: daughter Patient and family notified of of transfer: 01/31/22  Discharge Plan and Services   Discharge Planning Services: CM Consult            DME Arranged: N/A DME Agency: NA                  Social Determinants of Health (SDOH) Interventions Utilities Interventions: Intervention Not Indicated Alcohol Usage Interventions: Intervention Not Indicated (Score <7) Financial Strain Interventions: Intervention Not Indicated Stress Interventions: Intervention Not Indicated Social Connections Interventions: Intervention Not Indicated   Readmission Risk Interventions     No data to display             

## 2022-02-01 ENCOUNTER — Ambulatory Visit (HOSPITAL_COMMUNITY): Payer: Medicare Other

## 2022-02-01 DIAGNOSIS — E876 Hypokalemia: Secondary | ICD-10-CM | POA: Diagnosis not present

## 2022-02-01 DIAGNOSIS — J9620 Acute and chronic respiratory failure, unspecified whether with hypoxia or hypercapnia: Secondary | ICD-10-CM | POA: Diagnosis not present

## 2022-02-01 DIAGNOSIS — S37009A Unspecified injury of unspecified kidney, initial encounter: Secondary | ICD-10-CM | POA: Diagnosis not present

## 2022-02-01 DIAGNOSIS — R531 Weakness: Secondary | ICD-10-CM | POA: Diagnosis not present

## 2022-02-02 ENCOUNTER — Other Ambulatory Visit (HOSPITAL_COMMUNITY): Payer: Self-pay

## 2022-02-07 ENCOUNTER — Encounter: Payer: Self-pay | Admitting: *Deleted

## 2022-02-07 NOTE — Progress Notes (Signed)
PATIENT NAVIGATOR PROGRESS NOTE  Name: Alexandria Weaver Date: 02/07/2022 MRN: [id number]  DOB: 1924/11/28   Reason for visit:  Telephone call  Comments:  Spoke with [name] regarding her mother. They are going to hold [name]'s Gleevec while she is recovering from C Diff and recent hospitalization due to fatigue Informed Dr Benay Spice and he is in agreement    Time spent counseling/coordinating care: 30-45 minutes

## 2022-02-08 ENCOUNTER — Other Ambulatory Visit (HOSPITAL_COMMUNITY): Payer: Self-pay

## 2022-02-09 ENCOUNTER — Inpatient Hospital Stay: Payer: Medicare Other

## 2022-02-09 ENCOUNTER — Inpatient Hospital Stay: Payer: Medicare Other | Admitting: Nurse Practitioner

## 2022-02-09 ENCOUNTER — Encounter: Payer: Self-pay | Admitting: *Deleted

## 2022-02-15 DIAGNOSIS — R0989 Other specified symptoms and signs involving the circulatory and respiratory systems: Secondary | ICD-10-CM | POA: Diagnosis not present

## 2022-02-15 DIAGNOSIS — D519 Vitamin B12 deficiency anemia, unspecified: Secondary | ICD-10-CM | POA: Diagnosis not present

## 2022-02-15 DIAGNOSIS — M0600 Rheumatoid arthritis without rheumatoid factor, unspecified site: Secondary | ICD-10-CM | POA: Diagnosis not present

## 2022-02-15 DIAGNOSIS — I700 Atherosclerosis of aorta: Secondary | ICD-10-CM | POA: Diagnosis not present

## 2022-02-15 DIAGNOSIS — I951 Orthostatic hypotension: Secondary | ICD-10-CM | POA: Diagnosis not present

## 2022-02-15 DIAGNOSIS — E46 Unspecified protein-calorie malnutrition: Secondary | ICD-10-CM | POA: Diagnosis not present

## 2022-02-15 DIAGNOSIS — M810 Age-related osteoporosis without current pathological fracture: Secondary | ICD-10-CM | POA: Diagnosis not present

## 2022-02-15 DIAGNOSIS — R109 Unspecified abdominal pain: Secondary | ICD-10-CM | POA: Diagnosis not present

## 2022-02-15 DIAGNOSIS — E871 Hypo-osmolality and hyponatremia: Secondary | ICD-10-CM | POA: Diagnosis not present

## 2022-02-15 DIAGNOSIS — Z515 Encounter for palliative care: Secondary | ICD-10-CM | POA: Diagnosis not present

## 2022-02-15 DIAGNOSIS — Z7409 Other reduced mobility: Secondary | ICD-10-CM | POA: Diagnosis not present

## 2022-02-15 DIAGNOSIS — C49A0 Gastrointestinal stromal tumor, unspecified site: Secondary | ICD-10-CM | POA: Diagnosis not present

## 2022-02-23 ENCOUNTER — Inpatient Hospital Stay: Payer: Medicare Other | Attending: Oncology | Admitting: Nurse Practitioner

## 2022-02-24 ENCOUNTER — Other Ambulatory Visit: Payer: Self-pay | Admitting: *Deleted

## 2022-02-24 ENCOUNTER — Encounter: Payer: Self-pay | Admitting: *Deleted

## 2022-02-24 NOTE — Patient Outreach (Addendum)
Per [facility] Mrs. [place] resides in [facility]. Screening for potential [facility] care coordination services as benefit of insurance plan and PCP.  Secure communication sent to SNF social worker to inquire about transition plans.   Addendum: Update received from [place], [place] social worker. Mrs. [place] transitioned to long term care at [facility] on 02/22/22. She is now under hospice services.   No identifiable [facility] care coordination needs.    Marthenia Rolling, MSN, RN,BSN [place] Acute Care Coordinator [phone] (Direct dial)

## 2022-02-24 NOTE — Progress Notes (Signed)
Per Dr. Benay Spice: Needs F/U in ~ 2 weeks. Scheduling message sent.

## 2022-02-27 ENCOUNTER — Encounter: Payer: Self-pay | Admitting: *Deleted

## 2022-02-27 NOTE — Progress Notes (Signed)
Daughter called and advised that patient is in long term hospice and will not need to reschedule appointments. Daughter also advise that she will be reaching out to advise once everything is situated

## 2022-03-08 DIAGNOSIS — F330 Major depressive disorder, recurrent, mild: Secondary | ICD-10-CM | POA: Diagnosis not present

## 2022-03-08 DIAGNOSIS — M069 Rheumatoid arthritis, unspecified: Secondary | ICD-10-CM | POA: Diagnosis not present

## 2022-03-08 DIAGNOSIS — H35359 Cystoid macular degeneration, unspecified eye: Secondary | ICD-10-CM | POA: Diagnosis not present

## 2022-03-10 ENCOUNTER — Other Ambulatory Visit (HOSPITAL_COMMUNITY): Payer: Self-pay

## 2022-03-28 ENCOUNTER — Other Ambulatory Visit (HOSPITAL_COMMUNITY): Payer: Self-pay

## 2022-05-04 ENCOUNTER — Encounter: Payer: Self-pay | Admitting: *Deleted

## 2022-05-04 NOTE — Progress Notes (Signed)
Received call from Peter [place], Ms Reames's daughter that Mrs [place] passed away yesterday. Dr Benay Spice informed and all future appts canceled.  Condolences expressed.

## 2022-05-11 DEATH — deceased

## 2023-05-05 IMAGING — MR MR PELVIS W/O CM
11 of 12 series · 41 of 48 positions shown · non-contrast
Comparison: None.

CLINICAL DATA: Rectal mass.  Patient refused colonoscopy.

EXAM:
MRI PELVIS WITHOUT CONTRAST
TECHNIQUE: Multiplanar multisequence MR imaging of the pelvis was performed. No
intravenous contrast was administered. Ultrasound gel was
administered per rectum to optimize tumor evaluation.

[Series 2: T2 · sagittal · 2.5mm · 0.81mm/px · 3 of 35 slices shown (1 of 6)]
[im 1/35]
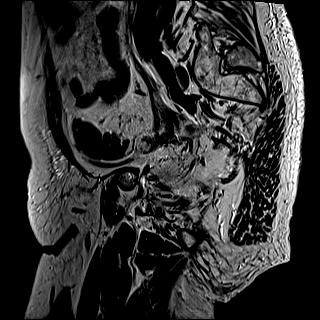
[im 18/35]
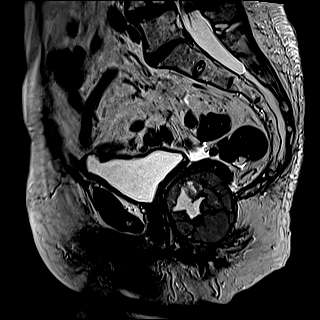
[im 35/35]
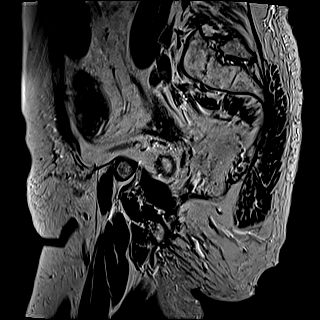

[Series 2: T2 · sagittal · 3.0mm · 1.19mm/px · 3 of 35 slices shown (2 of 6)]
[im 1/35]
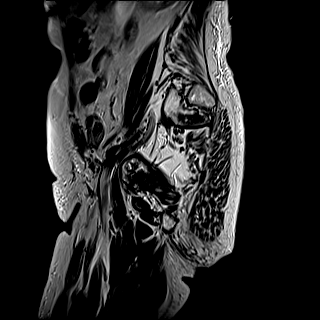
[im 18/35]
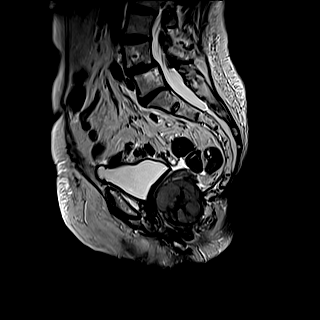
[im 35/35]
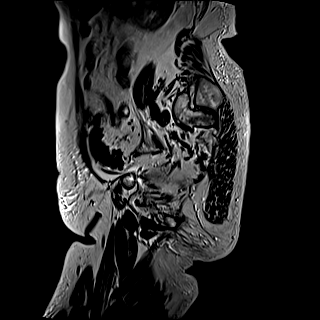

[Series 4: T2 · sagittal · 3.0mm · 1.19mm/px · 4 of 35 slices shown (3 of 6)]
[im 1/35]
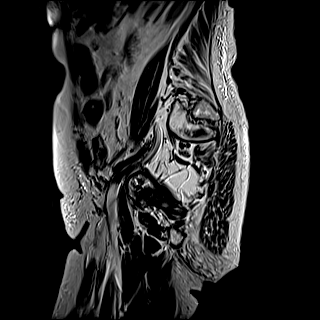
[im 12/35]
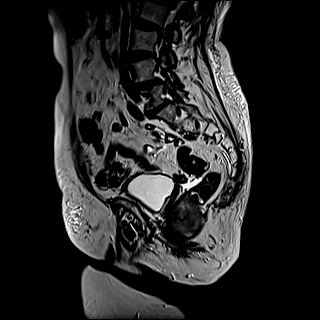
[im 23/35]
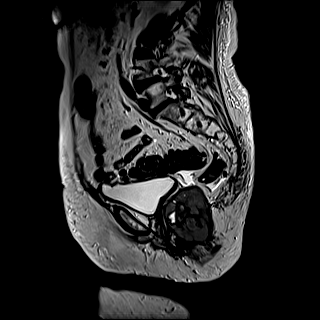
[im 35/35]
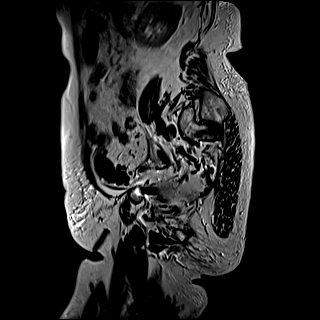

[Series 6: T2 · coronal · 3.0mm · 0.56mm/px · 5 of 45 slices shown (4 of 6)]
[im 1/45]
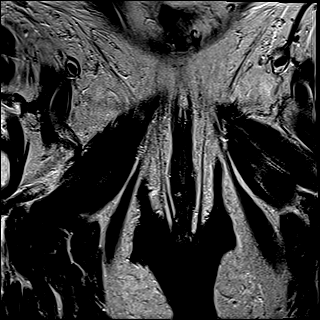
[im 12/45]
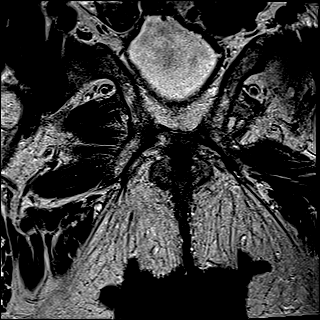
[im 23/45]
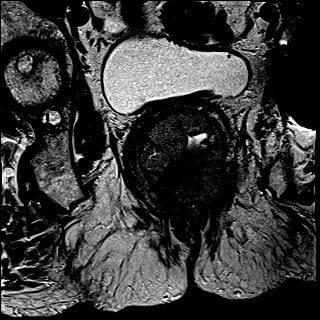
[im 34/45]
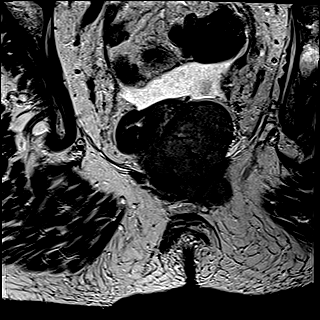
[im 45/45]
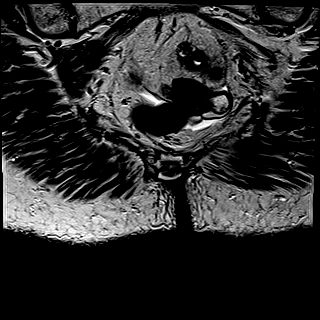

[Series 7: T2 · axial · 5.0mm · 0.59mm/px · z∈[-219,-3]mm · 4 of 37 slices shown (5 of 6)]
[im 1/37]
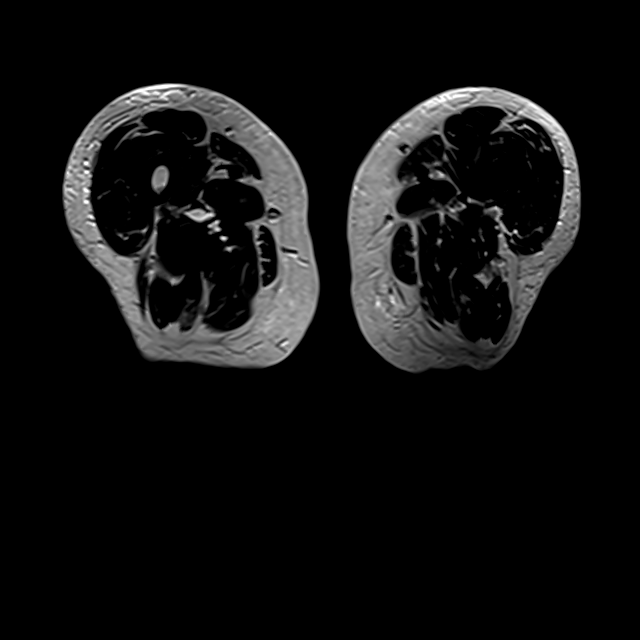
[im 13/37]
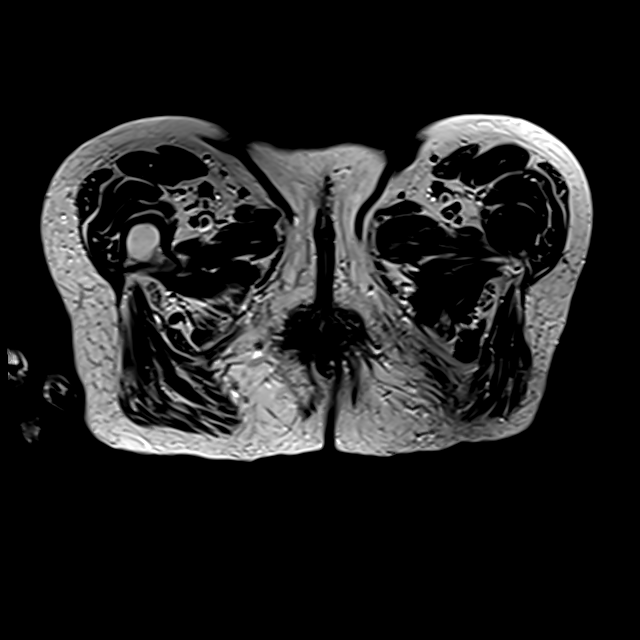
[im 25/37]
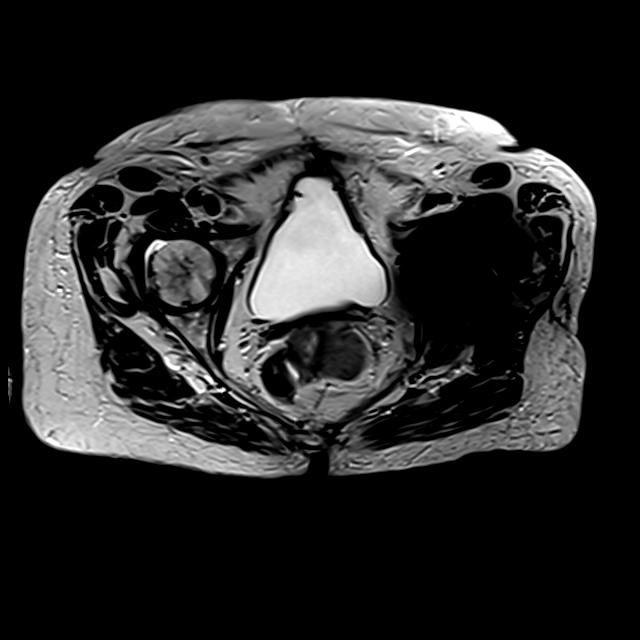
[im 37/37]
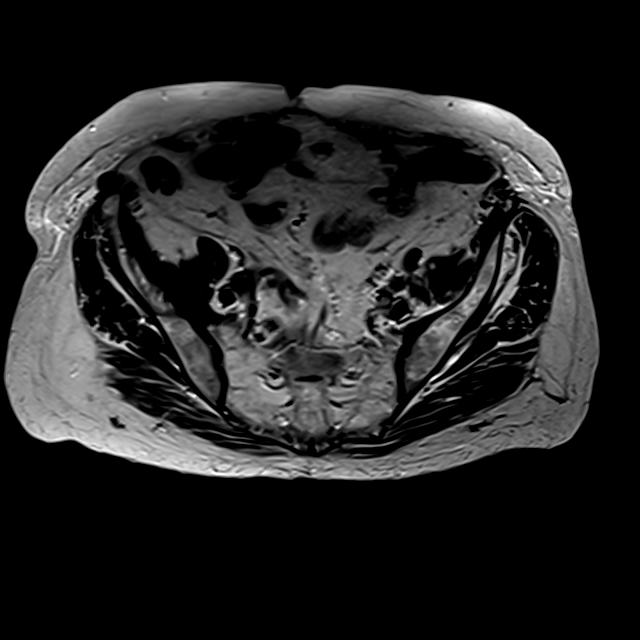

[Series 8: ax dwi_tracew · axial · 3.0mm · 1.48mm/px · z∈[-170,-53]mm · 4 of 34 slices shown (1 of 3)]
[im 1/34]
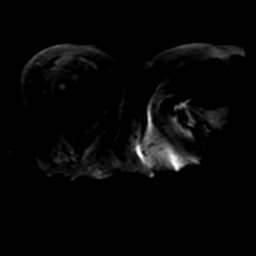
[im 12/34]
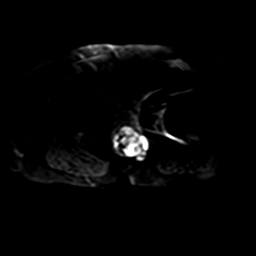
[im 23/34]
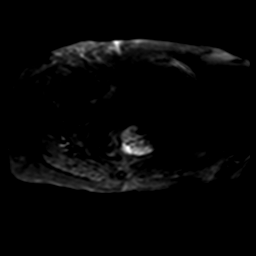
[im 34/34]
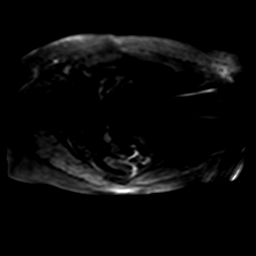

[Series 8: ax dwi_tracew · axial · 3.0mm · 1.48mm/px · z∈[-170,-53]mm · 4 of 40 slices shown (2 of 3)]
[im 1/40]
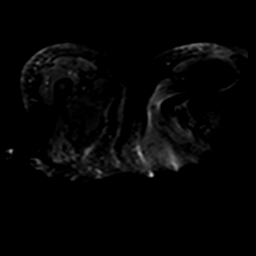
[im 14/40]
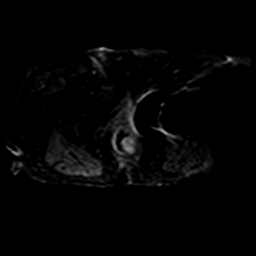
[im 27/40]
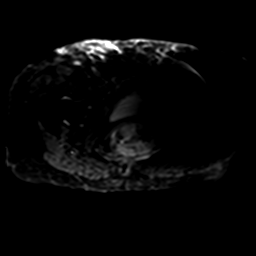
[im 40/40]
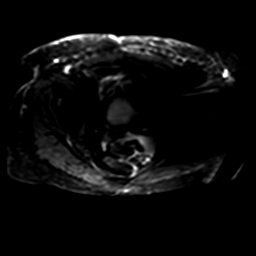

[Series 8: ax dwi_tracew · axial · 3.0mm · 1.48mm/px · z∈[-170,-53]mm · 4 of 34 slices shown (3 of 3)]
[im 1/34]
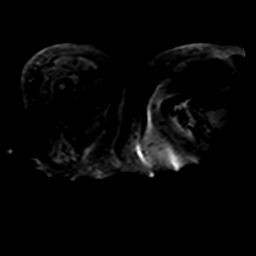
[im 12/34]
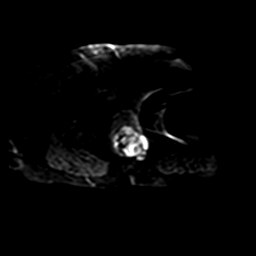
[im 23/34]
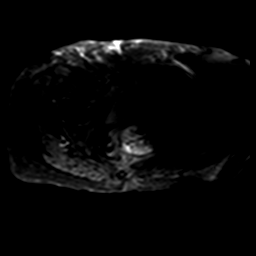
[im 34/34]
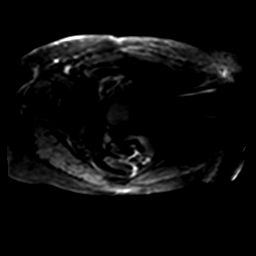

[Series 9: ax dwi_adc · axial · 3.0mm · 1.48mm/px · z∈[-170,-53]mm · 4 of 40 slices shown]
[im 1/40]
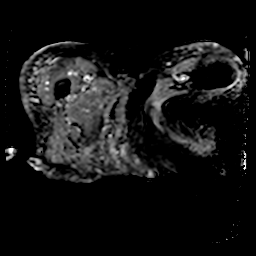
[im 14/40]
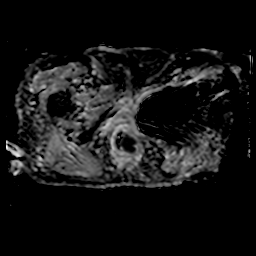
[im 27/40]
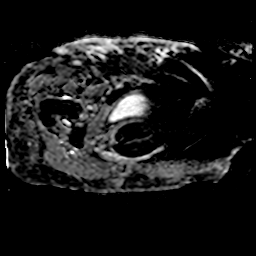
[im 40/40]
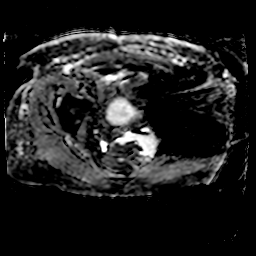

[Series 10: ax dwi_calc_bval · axial · 3.0mm · 1.48mm/px · z∈[-170,-53]mm · 4 of 35 slices shown]
[im 1/35]
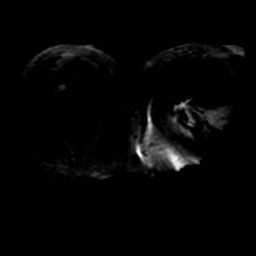
[im 12/35]
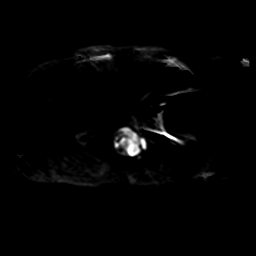
[im 23/35]
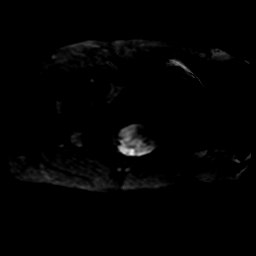
[im 35/35]
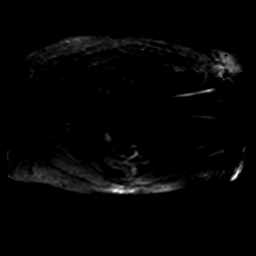

[Series 11: T2 · axial · 3.0mm · 0.56mm/px · z∈[-216,-188]mm · 2 of 45 slices shown (6 of 6)]
[im 1/45]
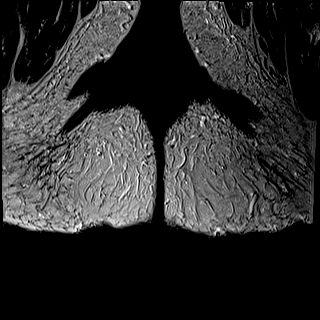
[im 12/45]
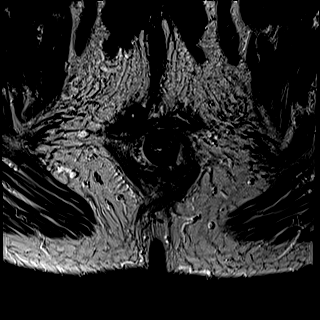

[41 of 48 positions shown; findings below may reference images not displayed]

FINDINGS: TUMOR LOCATION

Tumor distance from Anal Verge/Skin Surface:  2.3 cm on [DATE]

Tumor distance to Internal Anal Sphincter: Not applicable.

TUMOR DESCRIPTION

Circumferential Extent: Circumferential, including at 5.7 x 5.3 cm
on [DATE].

Tumor Length: 6.0 cm on [DATE]

T - CATEGORY

Extension through Muscularis Propria: At the 3 o'clock position.
Measuring 13 mm on [DATE]

Shortest Distance of any tumor/node from Mesorectal Fascia: Not
applicable.

Extramural Vascular Invasion/Tumor Thrombus: No

Invasion of Anterior Peritoneal Reflection: No

Involvement of Adjacent Organs or Pelvic Sidewall: No gross invasion
identified. There is significant mass effect upon the vagina.

Levator Ani Involvement: Yes. Gross involvement of the left-sided
sphincters with tumor extending lateral to the levator muscles
including on [DATE].

N - CATEGORY

Mesorectal Lymph NodesNone=N0

Extra-mesorectal Lymphadenopathy: No

Other: Hysterectomy. Small volume free pelvic fluid. Extensive
colonic diverticulosis. Left hip arthroplasty with resultant
susceptibility artifact, especially limiting the diffusion-weighted
images.
IMPRESSION: 6.0 x 5.7 x 5.3 cm anorectal primary with involvement of the
left-sided sphincter.

No evidence of nodal metastasis.
